# Patient Record
Sex: Female | Born: 1975 | Hispanic: No | Smoking: Never smoker
Health system: Southern US, Community
[De-identification: ages and names within clinical notes are randomized; demographics above are authoritative.]

## PROBLEM LIST (undated history)

## (undated) ENCOUNTER — Ambulatory Visit (HOSPITAL_COMMUNITY): Admission: EM | Source: Home / Self Care

## (undated) DIAGNOSIS — I8390 Asymptomatic varicose veins of unspecified lower extremity: Secondary | ICD-10-CM

## (undated) DIAGNOSIS — I509 Heart failure, unspecified: Secondary | ICD-10-CM

## (undated) DIAGNOSIS — I82409 Acute embolism and thrombosis of unspecified deep veins of unspecified lower extremity: Secondary | ICD-10-CM

## (undated) DIAGNOSIS — E669 Obesity, unspecified: Secondary | ICD-10-CM

## (undated) DIAGNOSIS — Z87440 Personal history of urinary (tract) infections: Secondary | ICD-10-CM

## (undated) DIAGNOSIS — I7300 Raynaud's syndrome without gangrene: Secondary | ICD-10-CM

## (undated) DIAGNOSIS — K219 Gastro-esophageal reflux disease without esophagitis: Secondary | ICD-10-CM

## (undated) HISTORY — PX: APPENDECTOMY: SHX54

## (undated) HISTORY — PX: ENDOVENOUS ABLATION SAPHENOUS VEIN W/ LASER: SUR449

## (undated) HISTORY — PX: ABDOMINAL HYSTERECTOMY: SHX81

## (undated) HISTORY — DX: Asymptomatic varicose veins of unspecified lower extremity: I83.90

## (undated) HISTORY — DX: Acute embolism and thrombosis of unspecified deep veins of unspecified lower extremity: I82.409

---

## 2004-01-10 HISTORY — PX: HERNIA REPAIR: SHX51

## 2008-08-25 ENCOUNTER — Ambulatory Visit: Payer: Self-pay | Admitting: Internal Medicine

## 2008-08-25 LAB — CONVERTED CEMR LAB
AST: 14 units/L (ref 0–37)
Alkaline Phosphatase: 92 units/L (ref 39–117)
BUN: 11 mg/dL (ref 6–23)
Basophils Absolute: 0 10*3/uL (ref 0.0–0.1)
Creatinine, Ser: 0.66 mg/dL (ref 0.40–1.20)
Eosinophils Absolute: 0.5 10*3/uL (ref 0.0–0.7)
Eosinophils Relative: 4 % (ref 0–5)
HCT: 41.5 % (ref 36.0–46.0)
Lymphocytes Relative: 27 % (ref 12–46)
Neutrophils Relative %: 62 % (ref 43–77)
Platelets: 367 10*3/uL (ref 150–400)
RDW: 12.8 % (ref 11.5–15.5)

## 2008-08-26 ENCOUNTER — Encounter (INDEPENDENT_AMBULATORY_CARE_PROVIDER_SITE_OTHER): Payer: Self-pay | Admitting: Internal Medicine

## 2008-08-27 ENCOUNTER — Ambulatory Visit (HOSPITAL_COMMUNITY): Admission: RE | Admit: 2008-08-27 | Discharge: 2008-08-27 | Payer: Self-pay | Admitting: Internal Medicine

## 2008-10-07 ENCOUNTER — Ambulatory Visit: Payer: Self-pay | Admitting: Internal Medicine

## 2009-01-06 ENCOUNTER — Ambulatory Visit: Payer: Self-pay | Admitting: Internal Medicine

## 2009-03-01 ENCOUNTER — Ambulatory Visit: Payer: Self-pay | Admitting: Internal Medicine

## 2009-03-05 ENCOUNTER — Ambulatory Visit: Payer: Self-pay | Admitting: Internal Medicine

## 2009-05-26 ENCOUNTER — Ambulatory Visit: Payer: Self-pay | Admitting: Family Medicine

## 2010-03-16 ENCOUNTER — Inpatient Hospital Stay (INDEPENDENT_AMBULATORY_CARE_PROVIDER_SITE_OTHER)
Admission: RE | Admit: 2010-03-16 | Discharge: 2010-03-16 | Disposition: A | Discharge status: Home / Self Care | Payer: Self-pay | Source: Ambulatory Visit | Attending: Family Medicine | Admitting: Family Medicine

## 2010-03-16 DIAGNOSIS — J069 Acute upper respiratory infection, unspecified: Secondary | ICD-10-CM

## 2010-03-24 ENCOUNTER — Encounter (INDEPENDENT_AMBULATORY_CARE_PROVIDER_SITE_OTHER): Payer: Self-pay | Admitting: *Deleted

## 2010-03-24 LAB — CONVERTED CEMR LAB
CO2: 22 meq/L (ref 19–32)
CRP: 1.5 mg/dL — ABNORMAL HIGH (ref <0.6)
Creatinine, Ser: 0.66 mg/dL (ref 0.40–1.20)
Eosinophils Absolute: 0.2 10*3/uL (ref 0.0–0.7)
Eosinophils Relative: 3 % (ref 0–5)
Glucose, Bld: 72 mg/dL (ref 70–99)
HCT: 40.6 % (ref 36.0–46.0)
Hemoglobin: 13.1 g/dL (ref 12.0–15.0)
Lymphs Abs: 2.2 10*3/uL (ref 0.7–4.0)
MCV: 92.5 fL (ref 78.0–100.0)
Monocytes Absolute: 0.6 10*3/uL (ref 0.1–1.0)
Monocytes Relative: 9 % (ref 3–12)
RBC: 4.39 M/uL (ref 3.87–5.11)
Rhuematoid fact SerPl-aCnc: <10 intl units/mL (ref <=14)
Sodium: 141 meq/L (ref 135–145)
Total Bilirubin: 0.4 mg/dL (ref 0.3–1.2)
Total Protein: 6.7 g/dL (ref 6.0–8.3)
WBC: 6.9 10*3/uL (ref 4.0–10.5)

## 2010-08-06 ENCOUNTER — Inpatient Hospital Stay (INDEPENDENT_AMBULATORY_CARE_PROVIDER_SITE_OTHER)
Admission: RE | Admit: 2010-08-06 | Discharge: 2010-08-06 | Disposition: A | Discharge status: Home / Self Care | Payer: Self-pay | Source: Ambulatory Visit | Attending: Family Medicine | Admitting: Family Medicine

## 2010-08-06 DIAGNOSIS — J019 Acute sinusitis, unspecified: Secondary | ICD-10-CM

## 2010-08-06 LAB — POCT I-STAT, CHEM 8
Glucose, Bld: 83 mg/dL (ref 70–99)
HCT: 40 % (ref 36.0–46.0)
Hemoglobin: 13.6 g/dL (ref 12.0–15.0)
Potassium: 4 mEq/L (ref 3.5–5.1)

## 2010-08-11 ENCOUNTER — Emergency Department (HOSPITAL_COMMUNITY): Payer: Self-pay

## 2010-08-11 ENCOUNTER — Emergency Department (HOSPITAL_COMMUNITY)
Admission: EM | Admit: 2010-08-11 | Discharge: 2010-08-11 | Disposition: A | Discharge status: Home / Self Care | Payer: Self-pay | Attending: Emergency Medicine | Admitting: Emergency Medicine

## 2010-08-11 DIAGNOSIS — I7300 Raynaud's syndrome without gangrene: Secondary | ICD-10-CM | POA: Insufficient documentation

## 2010-08-11 DIAGNOSIS — R6889 Other general symptoms and signs: Secondary | ICD-10-CM | POA: Insufficient documentation

## 2010-08-11 DIAGNOSIS — K222 Esophageal obstruction: Secondary | ICD-10-CM | POA: Insufficient documentation

## 2010-09-20 ENCOUNTER — Ambulatory Visit (HOSPITAL_COMMUNITY)
Admission: RE | Admit: 2010-09-20 | Discharge: 2010-09-20 | Disposition: A | Discharge status: Home / Self Care | Payer: Self-pay | Source: Ambulatory Visit | Attending: Gastroenterology | Admitting: Gastroenterology

## 2010-09-20 ENCOUNTER — Ambulatory Visit (HOSPITAL_COMMUNITY): Payer: Self-pay

## 2010-09-20 DIAGNOSIS — K222 Esophageal obstruction: Secondary | ICD-10-CM | POA: Insufficient documentation

## 2010-09-20 DIAGNOSIS — R1310 Dysphagia, unspecified: Secondary | ICD-10-CM | POA: Insufficient documentation

## 2010-09-20 DIAGNOSIS — I7300 Raynaud's syndrome without gangrene: Secondary | ICD-10-CM | POA: Insufficient documentation

## 2010-09-20 DIAGNOSIS — Z79899 Other long term (current) drug therapy: Secondary | ICD-10-CM | POA: Insufficient documentation

## 2010-09-21 ENCOUNTER — Emergency Department (HOSPITAL_COMMUNITY): Payer: Self-pay

## 2010-09-21 ENCOUNTER — Emergency Department (HOSPITAL_COMMUNITY)
Admission: EM | Admit: 2010-09-21 | Discharge: 2010-09-21 | Disposition: A | Discharge status: Home / Self Care | Payer: Self-pay | Attending: Emergency Medicine | Admitting: Emergency Medicine

## 2010-09-21 DIAGNOSIS — R079 Chest pain, unspecified: Secondary | ICD-10-CM | POA: Insufficient documentation

## 2010-09-21 DIAGNOSIS — Z9889 Other specified postprocedural states: Secondary | ICD-10-CM | POA: Insufficient documentation

## 2010-09-21 DIAGNOSIS — R070 Pain in throat: Secondary | ICD-10-CM | POA: Insufficient documentation

## 2010-09-21 DIAGNOSIS — G8918 Other acute postprocedural pain: Secondary | ICD-10-CM | POA: Insufficient documentation

## 2010-09-26 ENCOUNTER — Other Ambulatory Visit: Payer: Self-pay | Admitting: Gastroenterology

## 2010-09-26 ENCOUNTER — Other Ambulatory Visit (HOSPITAL_COMMUNITY): Payer: Self-pay | Admitting: Gastroenterology

## 2010-09-27 ENCOUNTER — Other Ambulatory Visit (HOSPITAL_COMMUNITY): Payer: Self-pay | Admitting: Gastroenterology

## 2010-09-27 ENCOUNTER — Ambulatory Visit (HOSPITAL_COMMUNITY)
Admission: RE | Admit: 2010-09-27 | Discharge: 2010-09-27 | Disposition: A | Discharge status: Home / Self Care | Payer: Self-pay | Source: Ambulatory Visit | Attending: Gastroenterology | Admitting: Gastroenterology

## 2010-09-27 DIAGNOSIS — R1310 Dysphagia, unspecified: Secondary | ICD-10-CM | POA: Insufficient documentation

## 2010-09-27 MED ORDER — IOHEXOL 300 MG/ML  SOLN
40.0000 mL | Freq: Once | INTRAMUSCULAR | Status: AC | PRN
  Administered 2010-09-27: 40 mL via ORAL

## 2011-01-04 ENCOUNTER — Encounter: Payer: Self-pay | Admitting: *Deleted

## 2011-01-04 ENCOUNTER — Emergency Department (HOSPITAL_COMMUNITY)
Admission: EM | Admit: 2011-01-04 | Discharge: 2011-01-04 | Disposition: A | Discharge status: Other Acute Inpatient Hospital | Payer: Self-pay | Source: Home / Self Care | Attending: Emergency Medicine | Admitting: Emergency Medicine

## 2011-01-04 DIAGNOSIS — R6889 Other general symptoms and signs: Secondary | ICD-10-CM

## 2011-01-04 DIAGNOSIS — J111 Influenza due to unidentified influenza virus with other respiratory manifestations: Secondary | ICD-10-CM

## 2011-01-04 HISTORY — DX: Raynaud's syndrome without gangrene: I73.00

## 2011-01-04 MED ORDER — FEXOFENADINE-PSEUDOEPHED ER 60-120 MG PO TB12: 1.0000 | ORAL_TABLET | Freq: Two times a day (BID) | ORAL | Status: DC

## 2011-01-04 MED ORDER — PREDNISONE 20 MG PO TABS: 40.0000 mg | ORAL_TABLET | Freq: Every day | ORAL | Status: AC

## 2011-01-04 NOTE — ED Notes (Signed)
Pt  Reports  Symptoms  Of  Aching  All over  sorethroat  Nasal  stuffyness   Chills      X  3  Days  Pt  Reports  Has  Had  Runny  Nose     As  Well    Pt  Reports  Has  Been using otc  meds  Without  releif

## 2011-01-04 NOTE — ED Provider Notes (Addendum)
History     CSN: [id number]  Arrival date & time 01/04/11  1302   First MD Initiated Contact with Patient 01/04/11 1445      Chief Complaint  Patient presents with  . 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 female presenting with cough.  Cough This is a new problem. The current episode started more than 2 days ago. The problem occurs constantly. The problem has not changed since onset.The cough is non-productive. Associated symptoms include chills, ear congestion, headaches, rhinorrhea, sore throat and myalgias. Pertinent negatives include no sweats and no wheezing. She has tried decongestants for the symptoms. The treatment provided no relief. Her past medical history does not include bronchitis, pneumonia, emphysema or asthma.    Past Medical History  Diagnosis Date  . Raynaud disease     Past Surgical History  Procedure Date  . Abdominal hysterectomy   . Cesarean section     Family History  Problem Relation Age of Onset  . Diabetes Mother   . Diabetes Father     History  Substance Use Topics  . Smoking status: Not on file  . Smokeless tobacco: Not on file  . Alcohol Use:     OB History    Grav Para Term Preterm Abortions TAB SAB Ect Mult Living                  Review of Systems  Constitutional: Positive for chills.  HENT: Positive for sore throat and rhinorrhea.   Respiratory: Positive for cough. Negative for wheezing.   Musculoskeletal: Positive for myalgias.  Neurological: Positive for headaches.    Allergies  Review of patient's allergies indicates not on file.  Home Medications     Current Outpatient Rx  Name Route Sig Dispense Refill  . NIFEDIPINE ER OSMOTIC 90 MG PO TB24 Oral Take 90 mg by mouth daily.      Marland Kitchen FEXOFENADINE-PSEUDOEPHED ER 60-120 MG PO TB12 Oral Take 1 tablet by mouth every 12 (twelve) hours. 30 tablet 0  . PREDNISONE 20 MG PO TABS Oral Take 2 tablets (40 mg total) by mouth daily. 10 tablet 5    BP 100/68  Pulse 62  Temp(Src) 98.1 F (36.7 C) (Oral)  Resp 14  SpO2 99%  Physical Exam  Nursing note and vitals reviewed. Constitutional: She appears well-developed and well-nourished. No distress.  HENT:  Head: Normocephalic.  Right Ear: Tympanic membrane normal.  Left Ear: Tympanic membrane normal.  Nose: Rhinorrhea present.  Mouth/Throat: Uvula is midline, oropharynx is clear and moist and mucous membranes are normal.  Eyes: Conjunctivae are normal.  Neck: Normal range of motion. Neck supple. No JVD present. No tracheal deviation present.  Cardiovascular: Normal rate.   Pulmonary/Chest: Effort normal. No respiratory distress. She has no wheezes. She has no rales. She exhibits no tenderness.  Abdominal: Soft.  Lymphadenopathy:    She has no cervical adenopathy.  Skin: No abrasion and no rash noted.    ED Course  Procedures (including critical care time)  Labs Reviewed - No data to display No results found.   1. Influenza-like symptoms       MDM  ILI  x 4 dys with normal lung exam comfortable- requesting doctors note-        Jimmie Molly, MD 01/04/11 1542  Jimmie Molly, MD 01/04/11 [phone]

## 2011-02-03 ENCOUNTER — Emergency Department (HOSPITAL_COMMUNITY)
Admission: EM | Admit: 2011-02-03 | Discharge: 2011-02-03 | Disposition: A | Discharge status: Home / Self Care | Payer: Self-pay | Source: Home / Self Care | Attending: Emergency Medicine | Admitting: Emergency Medicine

## 2011-02-03 ENCOUNTER — Encounter (HOSPITAL_COMMUNITY): Payer: Self-pay | Admitting: *Deleted

## 2011-02-03 DIAGNOSIS — J329 Chronic sinusitis, unspecified: Secondary | ICD-10-CM

## 2011-02-03 MED ORDER — PSEUDOEPHEDRINE-GUAIFENESIN ER 120-1200 MG PO TB12: 1.0000 | ORAL_TABLET | Freq: Two times a day (BID) | ORAL | Status: DC

## 2011-02-03 MED ORDER — FLUTICASONE PROPIONATE 50 MCG/ACT NA SUSP: 2.0000 | Freq: Every day | NASAL | Status: DC

## 2011-02-03 MED ORDER — IBUPROFEN 600 MG PO TABS: 600.0000 mg | ORAL_TABLET | Freq: Four times a day (QID) | ORAL | Status: AC | PRN

## 2011-02-03 MED ORDER — DOXYCYCLINE HYCLATE 100 MG PO CAPS: 100.0000 mg | ORAL_CAPSULE | Freq: Two times a day (BID) | ORAL | Status: AC

## 2011-02-03 NOTE — ED Provider Notes (Signed)
History     CSN: [id number]  Arrival date & time 02/03/11  1736   First MD Initiated Contact with Patient 02/03/11 1810      Chief Complaint  Patient presents with  . Otalgia  . Generalized Body Aches    (Consider location/radiation/quality/duration/timing/severity/associated sxs/prior treatment) HPI Comments: Pt with nasal congestion, postnasal drip, ST, nonproductive cough, bilateral frontal sinus pain/pressure worse with bending forward/lying down x 4 days, ear fullness. States teeth mildly hurt. No fevers, N/V, other HA, bodyaches, purulent nasal d/c. Has not tried anything for her symptoms. No known sick contacts. Patient recently held with flulike symptoms last month however, she states she recovered from this  ROS as noted in HPI. All other ROS negative.   Patient is a 36 y.o. female presenting with ear pain. The history is provided by the patient.  Otalgia    Past Medical History  Diagnosis Date  . Raynaud disease   . Raynaud's disease     Past Surgical History  Procedure Date  . Abdominal hysterectomy   . Cesarean section     Family History  Problem Relation Age of Onset  . Diabetes Mother   . Diabetes Father     History  Substance Use Topics  . Smoking status: Never Smoker   . Smokeless tobacco: Not on file  . Alcohol Use: No    OB History    Grav Para Term Preterm Abortions TAB SAB Ect Mult Living                  Review of Systems  HENT: Positive for ear pain.     Allergies  Latex  Home Medications   Current Outpatient Rx  Name Route Sig Dispense Refill  . DOXYCYCLINE HYCLATE 100 MG PO CAPS Oral Take 1 capsule (100 mg total) by mouth 2 (two) times daily. X 7 days 14 capsule 0  . FLUTICASONE PROPIONATE 50 MCG/ACT NA SUSP Nasal Place 2 sprays into the nose daily. 16 g 0  . IBUPROFEN 600 MG PO TABS Oral Take 1 tablet (600 mg total) by mouth every 6 (six) hours as needed for pain. 30 tablet 0  . NIFEDIPINE ER OSMOTIC 90 MG PO TB24 Oral  Take 90 mg by mouth daily.      Marland Kitchen PSEUDOEPHEDRINE-GUAIFENESIN ER [phone] MG PO TB12 Oral Take 1 tablet by mouth 2 (two) times daily. 20 each 0    BP 112/81  Pulse 63  Temp(Src) 98.2 F (36.8 C) (Oral)  Resp 18  SpO2 99%  Physical Exam  Nursing note and vitals reviewed. Constitutional: She is oriented to person, place, and time. She appears well-developed and well-nourished.  HENT:  Head: Normocephalic and atraumatic.  Right Ear: Tympanic membrane normal.  Left Ear: Tympanic membrane normal.  Nose: Mucosal edema and rhinorrhea present. No epistaxis. Right sinus exhibits maxillary sinus tenderness. Right sinus exhibits no frontal sinus tenderness. Left sinus exhibits maxillary sinus tenderness. Left sinus exhibits no frontal sinus tenderness.  Mouth/Throat: Uvula is midline, oropharynx is clear and moist and mucous membranes are normal.       (-) frontal, maxillary sinus tenderness  Eyes: Conjunctivae and EOM are normal. Pupils are equal, round, and reactive to light.  Neck: Normal range of motion. Neck supple.  Cardiovascular: Normal rate, regular rhythm, normal heart sounds and intact distal pulses.   Pulmonary/Chest: Effort normal and breath sounds normal.  Abdominal: Soft.  Musculoskeletal: Normal range of motion.  Lymphadenopathy:    She has no cervical adenopathy.  Neurological: She is alert and oriented to person, place, and time.  Skin: Skin is warm and dry. No rash noted.  Psychiatric: She has a normal mood and affect. Her behavior is normal. Judgment and thought content normal.    ED Course  Procedures (including critical care time)  Labs Reviewed - No data to display No results found.   1. Sinusitis     MDM  Previous chart reviewed. Seen last month for flu like sx.   No fevers >102, has had sx for < 10 days, no h/o double sickening. No historical or objective evidence of bacterial infection. No indication for abx. Will start flonase, mucinex-d, increase  fluids, nasal saline irrigation,  tylenol/motrin prn pain. Will send home with abx but advised pt to wait to fill it. Discussed MDM and plan with pt. Pt agrees with plan and will f/u with PMD prn.    Luiz Blare, MD 02/03/11 [phone]

## 2011-02-03 NOTE — ED Notes (Signed)
Pt is here with complaints of 3 week history of ear pressure, runny nose with yellow phlegm, body aches and generalized weakness.  Pt was treated at [facility] 3 weeks ago and told she had the flu.

## 2011-03-15 ENCOUNTER — Other Ambulatory Visit (HOSPITAL_COMMUNITY): Payer: Self-pay | Admitting: Gastroenterology

## 2011-03-17 ENCOUNTER — Ambulatory Visit (HOSPITAL_COMMUNITY)
Admission: RE | Admit: 2011-03-17 | Discharge: 2011-03-17 | Disposition: A | Discharge status: Home / Self Care | Payer: Self-pay | Source: Ambulatory Visit | Attending: Gastroenterology | Admitting: Gastroenterology

## 2011-03-17 DIAGNOSIS — R1310 Dysphagia, unspecified: Secondary | ICD-10-CM | POA: Insufficient documentation

## 2011-04-24 DIAGNOSIS — I7300 Raynaud's syndrome without gangrene: Secondary | ICD-10-CM | POA: Insufficient documentation

## 2011-07-09 ENCOUNTER — Emergency Department (INDEPENDENT_AMBULATORY_CARE_PROVIDER_SITE_OTHER)
Admission: EM | Admit: 2011-07-09 | Discharge: 2011-07-09 | Disposition: A | Discharge status: Home / Self Care | Payer: Self-pay | Source: Home / Self Care | Attending: Family Medicine | Admitting: Family Medicine

## 2011-07-09 ENCOUNTER — Encounter (HOSPITAL_COMMUNITY): Payer: Self-pay | Admitting: Emergency Medicine

## 2011-07-09 DIAGNOSIS — L0501 Pilonidal cyst with abscess: Secondary | ICD-10-CM

## 2011-07-09 MED ORDER — FLUCONAZOLE 200 MG PO TABS: 200.0000 mg | ORAL_TABLET | Freq: Every day | ORAL | Status: AC

## 2011-07-09 MED ORDER — CEPHALEXIN 500 MG PO CAPS: 500.0000 mg | ORAL_CAPSULE | Freq: Four times a day (QID) | ORAL | Status: AC

## 2011-07-09 MED ORDER — HYDROCODONE-ACETAMINOPHEN 5-500 MG PO TABS: 1.0000 | ORAL_TABLET | Freq: Four times a day (QID) | ORAL | Status: DC | PRN

## 2011-07-09 NOTE — ED Notes (Signed)
Instructed to place gown on, remove clothing for physician exam.  Provided warm blankets

## 2011-07-09 NOTE — Discharge Instructions (Signed)

## 2011-07-09 NOTE — ED Provider Notes (Signed)
History     CSN: [id number]  Arrival date & time 07/09/11  1234   None     Chief Complaint  Patient presents with  . Abscess    (Consider location/radiation/quality/duration/timing/severity/associated sxs/prior treatment) Patient is a 36 y.o. female presenting with abscess. The history is provided by the patient.  Abscess   This patient presents for evaluation of a cutaneous abscess.  The lesion is located in the lower back/pilonidal area.  Onset was four days ago.  Symptoms have not improved.  Abscess has associated symptoms of yellow green drainage and pain (6/10).  The patient does not have previous history of cutaneous abscesses.  There is no previous history of MRSA.   They do not  have a history of diabetes. Attempted to drainage at home, has applied A&D ointment to area Past Medical History  Diagnosis Date  . Raynaud's disease     Past Surgical History  Procedure Date  . Abdominal hysterectomy   . Cesarean section   . Appendectomy     Family History  Problem Relation Age of Onset  . Diabetes Mother   . Diabetes Father     History  Substance Use Topics  . Smoking status: Never Smoker   . Smokeless tobacco: Not on file  . Alcohol Use: No    OB History    Grav Para Term Preterm Abortions TAB SAB Ect Mult Living                  Review of Systems  All other systems reviewed and are negative.    Allergies  Latex  Home Medications   Current Outpatient Rx  Name Route Sig Dispense Refill  . OVER THE COUNTER MEDICATION  A,d&e ointment    . CEPHALEXIN 500 MG PO CAPS Oral Take 1 capsule (500 mg total) by mouth 4 (four) times daily. 28 capsule 0  . FLUCONAZOLE 200 MG PO TABS Oral Take 1 tablet (200 mg total) by mouth daily. Take one tab today with initiation of antibiotics and then second tab as needed in one week. 2 tablet 0  . FLUTICASONE PROPIONATE 50 MCG/ACT NA SUSP Nasal Place 2 sprays into the nose daily. 16 g 0  . NIFEDIPINE ER OSMOTIC 90 MG  PO TB24 Oral Take 90 mg by mouth daily.      Marland Kitchen PSEUDOEPHEDRINE-GUAIFENESIN ER [phone] MG PO TB12 Oral Take 1 tablet by mouth 2 (two) times daily. 20 each 0    There were no vitals taken for this visit.  Physical Exam  Nursing note and vitals reviewed. Constitutional: She is oriented to person, place, and time. Vital signs are normal. She appears well-developed and well-nourished. She is active and cooperative.  HENT:  Head: Normocephalic.  Eyes: Conjunctivae are normal. Pupils are equal, round, and reactive to light. No scleral icterus.  Neck: Trachea normal. Neck supple.  Cardiovascular: Normal rate, regular rhythm and normal heart sounds.   Pulmonary/Chest: Effort normal and breath sounds normal.  Neurological: She is alert and oriented to person, place, and time. No cranial nerve deficit or sensory deficit. GCS eye subscore is 4. GCS verbal subscore is 5. GCS motor subscore is 6.  Skin: Skin is warm and dry.          Superficial abscess approx.3/4 cm diameter, open and draining scant amount purulent discharge  Psychiatric: She has a normal mood and affect. Her speech is normal and behavior is normal. Judgment and thought content normal. Cognition and memory are normal.  ED Course  Procedures (including critical care time)  Labs Reviewed - No data to display No results found.   1. Pilonidal abscess       MDM  Keep area covered, take antibiotics as prescribed, tylenol or motrin for pain, RTC PRN if symptoms do not improve.  Diflucan for yeast prophylaxis.        Johnsie Kindred, NP 07/09/11 1452

## 2011-07-09 NOTE — ED Notes (Signed)
Reports abscess on buttocks. Painful area.  Noticed wed or thursday

## 2011-07-09 NOTE — ED Notes (Signed)
Patient wanting to leave, patient did walk to lobby, carmen notified.  Patient returned to treatment room.  Dominique [name] informed patient coming in to see her.

## 2011-07-10 NOTE — ED Provider Notes (Signed)
Medical screening examination/treatment/procedure(s) were performed by resident physician or non-physician practitioner and as supervising physician I was immediately available for consultation/collaboration.   Christmas Faraci DOUGLAS MD.    Alisen Marsiglia D Gresham Caetano, MD 07/10/11 2025 

## 2011-07-19 ENCOUNTER — Other Ambulatory Visit: Payer: Self-pay

## 2011-07-19 DIAGNOSIS — I7300 Raynaud's syndrome without gangrene: Secondary | ICD-10-CM

## 2011-08-01 ENCOUNTER — Encounter (HOSPITAL_COMMUNITY): Payer: Self-pay

## 2011-08-01 ENCOUNTER — Emergency Department (HOSPITAL_COMMUNITY)
Admission: EM | Admit: 2011-08-01 | Discharge: 2011-08-01 | Disposition: A | Discharge status: Home / Self Care | Payer: Self-pay | Attending: Emergency Medicine | Admitting: Emergency Medicine

## 2011-08-01 DIAGNOSIS — R531 Weakness: Secondary | ICD-10-CM

## 2011-08-01 DIAGNOSIS — R5381 Other malaise: Secondary | ICD-10-CM | POA: Insufficient documentation

## 2011-08-01 DIAGNOSIS — I7300 Raynaud's syndrome without gangrene: Secondary | ICD-10-CM | POA: Insufficient documentation

## 2011-08-01 NOTE — ED Notes (Signed)
Flu like symptoms, and abscess to bottom, headache ears feels clogged.

## 2011-08-01 NOTE — ED Provider Notes (Signed)
History  This chart was scribed for Doug Sou, MD by Ladona Ridgel Day. This patient was seen in room TR08C/TR08C and the patient's care was started at 1144.   CSN: [id number]  Arrival date & time 08/01/11  1104   First MD Initiated Contact with Patient 08/01/11 1144      Chief Complaint  Patient presents with  . URI    The history is provided by the patient. No language interpreter was used.   Dominique [name] is a 36 y.o. female who presents to the Emergency Department complaining of flu like symptoms which began about 6 days ago. She states she has visual disturbances which are aggravated when she is mad or frustrated. She also states intermittent tinnitus, HA, generalized weakness, fever, congestion. She denies any emesis, nausea, and diarrhea as associated symptoms. She tried taking Tylenol with some relief but has not taken any today. She denies any smoking or drinking.. denies blurred vision presently. Complains of slight ringing in her ear is presently. Admits to nasal congestion. No treatment prior to coming here. Symptoms are worse when she gets mad improved when she is calm  Her PCP is [facility] at [facility]  Past Medical History  Diagnosis Date  . Raynaud's disease     Past Surgical History  Procedure Date  . Abdominal hysterectomy   . Cesarean section   . Appendectomy     Family History  Problem Relation Age of Onset  . Diabetes Mother   . Diabetes Father     History  Substance Use Topics  . Smoking status: Never Smoker   . Smokeless tobacco: Not on file  . Alcohol Use: No    OB History    Grav Para Term Preterm Abortions TAB SAB Ect Mult Living                  Review of Systems  HENT: Positive for congestion and tinnitus.        Nasal congestion  Eyes: Positive for visual disturbance.  Respiratory: Negative.   Cardiovascular: Negative.   Gastrointestinal: Negative.   Musculoskeletal: Negative.   Skin: Negative.   Neurological: Positive for  weakness.  Hematological: Negative.   Psychiatric/Behavioral: Negative.   All other systems reviewed and are negative.    Allergies  Latex  Home Medications   Current Outpatient Rx  Name Route Sig Dispense Refill  . FLUTICASONE PROPIONATE 50 MCG/ACT NA SUSP Nasal Place 2 sprays into the nose daily. 16 g 0  . NIFEDIPINE ER OSMOTIC 90 MG PO TB24 Oral Take 90 mg by mouth daily.      Marland Kitchen OVER THE COUNTER MEDICATION  A,d&e ointment    . PSEUDOEPHEDRINE-GUAIFENESIN ER [phone] MG PO TB12 Oral Take 1 tablet by mouth 2 (two) times daily. 20 each 0    BP 112/79  Pulse 68  Temp 99.1 F (37.3 C) (Oral)  Resp 18  SpO2 100%  Physical Exam  Nursing note and vitals reviewed. Constitutional: She is oriented to person, place, and time. She appears well-developed and well-nourished.  HENT:  Head: Normocephalic and atraumatic.  Right Ear: External ear normal.  Left Ear: External ear normal.  Mouth/Throat: Oropharynx is clear and moist.       Bilateral tympanic membranes normal  Eyes: Conjunctivae are normal. Pupils are equal, round, and reactive to light.       Fundi benign  Neck: Neck supple. No tracheal deviation present. No thyromegaly present.  Cardiovascular: Normal rate and regular rhythm.  No murmur heard. Pulmonary/Chest: Effort normal and breath sounds normal.  Abdominal: Soft. Bowel sounds are normal. She exhibits no distension. There is no tenderness.  Musculoskeletal: Normal range of motion. She exhibits no edema and no tenderness.  Neurological: She is alert and oriented to person, place, and time. Coordination normal.       Gait normal Romberg normal pronator drift normal  Skin: Skin is warm and dry. No rash noted.  Psychiatric: She has a normal mood and affect.    ED Course  Procedures (including critical care time) DIAGNOSTIC STUDIES: Oxygen Saturation is 100% on room air, normal by my interpretation.   CBG equals 88, normal  COORDINATION OF CARE:    Labs  Reviewed - No data to display No results found.   No diagnosis found.    MDM  Feel that patient has large component of anxiety as cause of her symptoms. She appears well nontoxic [facility] in management f[facility]u [facility] when necessary Diagnosis nonspecific weakness   I personally performed the services described in this documentation, which was scribed in my presence. The recorded information has been reviewed and considered.       Doug Sou, MD 08/01/11 1200

## 2011-08-01 NOTE — ED Notes (Signed)
Pt reports she starting having flu like symptoms since Thursday. Pt reports she gets episodes of blurry eyes, nausea, ear ringing and a hot flushed feeling lasts about 10 mins, pt reports she has had about 4 episodes on Saturday, and stated she was around her ex which made her very anxious. Reports she has a couple on Sunday but was really tired and they weren't as bad.

## 2011-08-18 ENCOUNTER — Encounter: Payer: Self-pay | Admitting: Surgery

## 2011-08-21 ENCOUNTER — Ambulatory Visit (INDEPENDENT_AMBULATORY_CARE_PROVIDER_SITE_OTHER): Payer: Self-pay | Admitting: Surgery

## 2011-08-21 ENCOUNTER — Encounter: Payer: Self-pay | Admitting: Surgery

## 2011-08-21 ENCOUNTER — Ambulatory Visit (INDEPENDENT_AMBULATORY_CARE_PROVIDER_SITE_OTHER): Payer: Self-pay | Admitting: *Deleted

## 2011-08-21 VITALS — BP 116/60 | HR 65 | Resp 16 | Ht <= 58 in | Wt 174.0 lb

## 2011-08-21 DIAGNOSIS — M79609 Pain in unspecified limb: Secondary | ICD-10-CM | POA: Insufficient documentation

## 2011-08-21 DIAGNOSIS — I8390 Asymptomatic varicose veins of unspecified lower extremity: Secondary | ICD-10-CM

## 2011-08-21 DIAGNOSIS — I83893 Varicose veins of bilateral lower extremities with other complications: Secondary | ICD-10-CM

## 2011-08-21 DIAGNOSIS — M7989 Other specified soft tissue disorders: Secondary | ICD-10-CM

## 2011-08-21 DIAGNOSIS — I7300 Raynaud's syndrome without gangrene: Secondary | ICD-10-CM

## 2011-08-21 NOTE — Progress Notes (Signed)
[facility]   Patient name: Dominique [name] MRN: [id number] DOB: 30-Nov-1975 Sex: female   Referred by: Dr.Shaw  Reason for referral:  Chief Complaint  Patient presents with  . New Evaluation    ? Raynauds, hands bright red and painful/Dr. Norberto Sorenson -  pt c/o of swelling and heaviness in legs     HISTORY OF PRESENT ILLNESS: The patient is referred today for evaluation of bilateral hands and feet discoloration and pain. She states that this has been occurring for many many years. This is been treated with Procardia in the past thinking that this was Raynaud's phenomenon. However, she recently saw a rheumatologist. In this setting that it was not Raynaud's and that it may be a vascular problem. Therefore, she has been referred to me. She states that her symptoms are made worse by walking and with stress. The best to for problems as hot baths. She does complain of her legs getting heavy with activity. She has a history of a right leg DVT following delivery of her child. She has tried to her compression stockings in the past but had difficulty with them cutting off her circulation. She did have severe trauma to the right leg recently it has been associated with her swelling.  The patient also takes medication for reflux which has been well controlled. 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  Past Medical History  Diagnosis Date  . Raynaud's disease   . DVT (deep venous thrombosis)     Past Surgical History  Procedure Date  . Abdominal hysterectomy   . Cesarean section   . Appendectomy   . Hernia repair 2006    History   Social History  . Marital Status: Divorced    Spouse Name: N/A    Number of Children: N/A  . Years of Education: N/A   Occupational History  . Not on file.   Social History Main Topics  . Smoking status: Never Smoker   . Smokeless tobacco: Never Used  . Alcohol Use: No  . Drug Use: No     former narcotic presription  abuse  . Sexually Active: Not on file   Other Topics Concern  . Not on file   Social History Narrative  . No narrative on file    Family History  Problem Relation Age of Onset  . Diabetes Mother   . Heart disease Mother   . Hyperlipidemia Mother   . Hypertension Mother   . Other Mother     amputation, varicose veins  . Diabetes Father   . Hyperlipidemia Father   . Hypertension Father   . Peripheral vascular disease Father   . Other Father     varicose veins  . Deep vein thrombosis Brother   . Other Brother     varicose veins    Allergies as of 08/21/2011 - Review Complete 08/21/2011  Allergen Reaction Noted  . Latex  02/03/2011    Current Outpatient Prescriptions on File Prior to Visit  Medication Sig Dispense Refill  . OVER THE COUNTER MEDICATION A,d&e ointment      . Pantoprazole Sodium (PROTONIX PO) Take by mouth daily.      . fluticasone (FLONASE) 50 MCG/ACT nasal spray Place 2 sprays into the nose daily.  16 g  0  . NIFEdipine (PROCARDIA XL/ADALAT-CC) 90 MG 24 hr tablet Take 90 mg by mouth daily.        . Pseudoephedrine-Guaifenesin (MUCINEX D) [phone] MG TB12  Take 1 tablet by mouth 2 (two) times daily.  20 each  0     REVIEW OF SYSTEMS: Cardiovascular: Positive for pain in her legs with walking, history of DVT, slight swelling, varicose veins Pulmonary: Negative Neuro: Positive for weakness in her arms and legs Hematology: Negative GI: Negative GU: Negative Psychiatry: Negative Skin: Negative Constitutional: No fever no chills All other systems negative  PHYSICAL EXAMINATION: General: The patient appears their stated age.  Vital signs are BP 116/60  Pulse 65  Resp 16  Ht 4' 7.5" (1.41 m)  Wt 174 lb (78.926 kg)  BMI 39.72 kg/m2  SpO2 100% HEENT:  No gross abnormalities Pulmonary: Respirations are non-labored Abdomen: Soft and non-tender  Musculoskeletal: There are no major deformities.   Neurologic: No focal weakness or paresthesias are  detected, Skin: Reddish discoloration of both upper extremity. Psychiatric: The patient has normal affect. Cardiovascular: There is a regular rate and rhythm without significant murmur appreciated. Palpable dorsalis pedis pulse bilaterally. Palpable brachial and radial pulse bilaterally.  Nonpitting right leg edema bilateral varicosities in the medial region of both legs around the knee  Diagnostic Studies: Ultrasound was ordered and reviewed today. This shows triphasic waveforms in bilateral radial and ulnar arteries. She has no evidence of DVT in the upper extremity.   Assessment:  Bilateral arm and leg discoloration with pain Venous insufficiency Plan: After reviewing the patient's ultrasound and examining her I agree that this most likely is not Raynaud's. I also do not feel that her symptoms are vascular in origin. I suspect she has an undiagnosed vasculitis or other autoimmune problem that would be best evaluated by rheumatology.  In addition to her above symptoms she also has I believe to be venous insufficiency. The right leg is worse than the left. On the right leg this most likely is secondary to her history of DVT. I have given her a prescription of thigh-high compression stockings, 20-30 mm gradient. I am going to have her come back for reflux evaluation. I am doing this because she states that her varicosities become very painful and very bothersome to her. She may benefit from having these removed. She will followup in 3 months for this problem     V. Charlena Cross, M.D. [facility] [place] Office: [phone] Pager:  [phone]

## 2011-08-22 NOTE — Addendum Note (Signed)
Addended by: Sharee Pimple on: 08/22/2011 08:06 AM   Modules accepted: Orders

## 2011-09-05 ENCOUNTER — Telehealth: Payer: Self-pay | Admitting: *Deleted

## 2011-09-05 ENCOUNTER — Emergency Department (HOSPITAL_COMMUNITY): Payer: Self-pay

## 2011-09-05 ENCOUNTER — Emergency Department (HOSPITAL_COMMUNITY)
Admission: EM | Admit: 2011-09-05 | Discharge: 2011-09-05 | Disposition: A | Discharge status: Home / Self Care | Payer: Self-pay | Attending: Emergency Medicine | Admitting: Emergency Medicine

## 2011-09-05 ENCOUNTER — Encounter (HOSPITAL_COMMUNITY): Payer: Self-pay | Admitting: Emergency Medicine

## 2011-09-05 DIAGNOSIS — Z86718 Personal history of other venous thrombosis and embolism: Secondary | ICD-10-CM | POA: Insufficient documentation

## 2011-09-05 DIAGNOSIS — M25579 Pain in unspecified ankle and joints of unspecified foot: Secondary | ICD-10-CM | POA: Insufficient documentation

## 2011-09-05 DIAGNOSIS — M79673 Pain in unspecified foot: Secondary | ICD-10-CM

## 2011-09-05 DIAGNOSIS — I7300 Raynaud's syndrome without gangrene: Secondary | ICD-10-CM | POA: Insufficient documentation

## 2011-09-05 MED ORDER — IBUPROFEN 600 MG PO TABS: 600.0000 mg | ORAL_TABLET | Freq: Three times a day (TID) | ORAL | Status: DC | PRN

## 2011-09-05 MED ORDER — IBUPROFEN 400 MG PO TABS
800.0000 mg | ORAL_TABLET | Freq: Once | ORAL | Status: AC
  Administered 2011-09-05: 800 mg via ORAL
  Filled 2011-09-05 (×2): qty 1

## 2011-09-05 NOTE — Telephone Encounter (Signed)
Patient called in to report that her legs are still feeling heavy, no signs of DVT, no discoloration She has not gotten her compression hose yet because of finances; [facility] patient and she has not gotten established with another PCP. I advised her to follow up with the phone numbers for other PCPs that she was given by [facility] so she can be referred to a Rheumatologist as per Dr. Estanislado Spire note. She will also get her stockings and wear them; she will [facility] for her varicose veins. She voiced understanding of this plan and is in agreement.

## 2011-09-05 NOTE — ED Provider Notes (Signed)
History   This chart was scribed for Gerhard Munch, MD by Melba Coon. The patient was seen in room TR07C/TR07C and the patient's care was started at 3:43PM.    CSN: [id number]  Arrival date & time 09/05/11  1402   First MD Initiated Contact with Patient 09/05/11 1430      Chief Complaint  Patient presents with  . Ankle Pain    (Consider location/radiation/quality/duration/timing/severity/associated sxs/prior treatment) The history is provided by the patient. No language interpreter was used.   Dominique [name] is a 36 y.o. female who presents to the Emergency Department complaining of constant, moderate to severe right ankle pain with an onset a month ago. 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 Pt states that it hurts to take her shoes off. No new shoes since 2 months ago. Pain is worse at the beginning of the day but wanes off while the day progresses. Intermittent right foot numbness present. No HA, fever, neck pain, sore throat, rash, back pain, CP, SOB, abd pain, n/v/d, dysuria, or extremity edema, weakness, or tingling. Pt states that she broke the same foot 6-7 years ago but no new injury or trauma to the affected ankle. No known allergies. No other pertinent medical symptoms.   Past Medical History  Diagnosis Date  . Raynaud's disease   . DVT (deep venous thrombosis)     Past Surgical History  Procedure Date  . Abdominal hysterectomy   . Cesarean section   . Appendectomy   . Hernia repair 2006    Family History  Problem Relation Age of Onset  . Diabetes Mother   . Heart disease Mother   . Hyperlipidemia Mother   . Hypertension Mother   . Other Mother     amputation, varicose veins  . Diabetes Father   . Hyperlipidemia Father   . Hypertension Father   . Peripheral vascular disease Father   . Other Father     varicose veins  . Deep vein thrombosis Brother   . Other Brother     varicose veins    History  Substance Use Topics  .  Smoking status: Never Smoker   . Smokeless tobacco: Never Used  . Alcohol Use: No    OB History    Grav Para Term Preterm Abortions TAB SAB Ect Mult Living                  Review of Systems 10 Systems reviewed and all are negative for acute change except as noted in the HPI.   Allergies  Latex  Home Medications   Current Outpatient Rx  Name Route Sig Dispense Refill  . OVER THE COUNTER MEDICATION  1 application daily as needed. A&D Ointment to hands.    Marland Kitchen PANTOPRAZOLE SODIUM 40 MG PO TBEC Oral Take 40 mg by mouth daily.      BP 113/85  Pulse 86  Temp 98.9 F (37.2 C) (Oral)  Resp 16  SpO2 99%  Physical Exam  Nursing note and vitals reviewed. Constitutional: She is oriented to person, place, and time. She appears well-developed and well-nourished. No distress.  HENT:  Head: Normocephalic and atraumatic.  Eyes: EOM are normal.  Neck: Neck supple. No tracheal deviation present.  Cardiovascular: Normal rate and intact distal pulses.   Pulmonary/Chest: Effort normal. No respiratory distress.  Musculoskeletal: Normal range of motion. She exhibits tenderness (Lateral right foot tenderness with pain at the junction of the proximal 5th digit).  Good eversion and aversion of the right ankle  Neurological: She is alert and oriented to person, place, and time.       Good strength and sensation of the lower extremities.  Skin: Skin is warm and dry.  Psychiatric: She has a normal mood and affect. Her behavior is normal.    ED Course  Procedures (including critical care time)  DIAGNOSTIC STUDIES: Oxygen Saturation is 99% on room air, normal by my interpretation.    COORDINATION OF CARE:  3:47PM - right ankle XR results reviewed; results are unremarkable. Right foot XR and ibuprofen will be ordered for the pt. 4:58PM - right foot XR imaging results reviewed; results are unremarkable. Pt should f/u with orthopedist and will be Rx ibuprofen. Pt ready for d/c.   Labs  Reviewed - No data to display Dg Ankle Complete Right  09/05/2011  *RADIOLOGY REPORT*  Clinical Data: The ankle pain.  RIGHT ANKLE - COMPLETE 3+ VIEW  Comparison: No priors.  Findings: Three views of the right ankle demonstrate no acute fracture, subluxation or dislocation.  There is an old healed fracture of the distal fibula.  Small plantar calcaneal spur is incidentally noted.  IMPRESSION: 1.  No acute radiographic abnormality of the right ankle. 2.  Old healed fracture of the distal right fibula.   Original Report Authenticated By: Florencia Reasons, M.D.    Dg Foot Complete Right  09/05/2011  *RADIOLOGY REPORT*  Clinical Data: Lateral foot pain.  RIGHT FOOT COMPLETE - 3+ VIEW  Comparison: None.  Findings: Two ossicles node along the expected course of the distal tibialis posterior tendon, suggesting bifida accessory navicular.  No malalignment at the Lisfranc joint observed.  No fracture or acute bony findings.  Small plantar calcaneal spur.  IMPRESSION:  1.  No acute findings. 2.  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 3.  Small plantar calcaneal spur.   Original Report Authenticated By: Dellia Cloud, M.D.      No diagnosis found.    MDM  I personally performed the services described in this documentation, which was scribed in my presence. The recorded information has been reviewed and considered.  This generally well-appearing female presents with ongoing right ankle and foot pain.  Notably, the patient has a history of fibular fracture on this side in the distant past, and is very active in her occupation.  On exam the patient is in no distress, though she is tenderness to palpation about the lateral foot and distal lateral anterior malleolus.  The patient's x-rays are unremarkable, reassuring for the low suspicion of stress fracture or new pathology.  The patient's findings are likely secondary to her prior trauma.  We discussed the need for anti-inflammatories, ice packs, orthopedics  followup, and the patient was discharged in stable condition.    Gerhard Munch, MD 09/05/11 1753

## 2011-09-05 NOTE — ED Notes (Signed)
Pt c/o right ankle pain x 1 month; pt sts old injury but denies any new injury

## 2011-09-05 NOTE — ED Notes (Signed)
Patient given ace wrap verbalized understanding of use and applying wrap.  Patient did not want ace wrap applied at this time.

## 2011-09-26 ENCOUNTER — Encounter (HOSPITAL_COMMUNITY): Payer: Self-pay | Admitting: *Deleted

## 2011-09-26 ENCOUNTER — Emergency Department (INDEPENDENT_AMBULATORY_CARE_PROVIDER_SITE_OTHER)
Admission: EM | Admit: 2011-09-26 | Discharge: 2011-09-26 | Disposition: A | Discharge status: Home / Self Care | Payer: Self-pay | Source: Home / Self Care | Attending: Family Medicine | Admitting: Family Medicine

## 2011-09-26 DIAGNOSIS — J309 Allergic rhinitis, unspecified: Secondary | ICD-10-CM

## 2011-09-26 DIAGNOSIS — J302 Other seasonal allergic rhinitis: Secondary | ICD-10-CM

## 2011-09-26 LAB — POCT RAPID STREP A: Streptococcus, Group A Screen (Direct): NEGATIVE

## 2011-09-26 MED ORDER — FLUTICASONE PROPIONATE 50 MCG/ACT NA SUSP
1.0000 | Freq: Two times a day (BID) | NASAL | Status: DC
Start: 2011-09-26 — End: 2011-09-30

## 2011-09-26 MED ORDER — FEXOFENADINE HCL 180 MG PO TABS: 180.0000 mg | ORAL_TABLET | Freq: Every day | ORAL | Status: DC

## 2011-09-26 NOTE — ED Provider Notes (Signed)
History     CSN: [id number]  Arrival date & time 09/26/11  1212   First MD Initiated Contact with Patient 09/26/11 1230      Chief Complaint  Patient presents with  . URI  . Sore Throat    (Consider location/radiation/quality/duration/timing/severity/associated sxs/prior treatment) Patient is a 36 y.o. female presenting with URI and pharyngitis. The history is provided by the patient.  URI The primary symptoms include fever, fatigue and sore throat. Primary symptoms do not include cough, nausea, vomiting or rash. The current episode started 2 days ago. This is a new problem. The problem has been gradually improving.  Symptoms associated with the illness include congestion and rhinorrhea.  Sore Throat    Past Medical History  Diagnosis Date  . Raynaud's disease   . DVT (deep venous thrombosis)     Past Surgical History  Procedure Date  . Abdominal hysterectomy   . Cesarean section   . Appendectomy   . Hernia repair 2006    Family History  Problem Relation Age of Onset  . Diabetes Mother   . Heart disease Mother   . Hyperlipidemia Mother   . Hypertension Mother   . Other Mother     amputation, varicose veins  . Diabetes Father   . Hyperlipidemia Father   . Hypertension Father   . Peripheral vascular disease Father   . Other Father     varicose veins  . Deep vein thrombosis Brother   . Other Brother     varicose veins    History  Substance Use Topics  . Smoking status: Never Smoker   . Smokeless tobacco: Never Used  . Alcohol Use: No    OB History    Grav Para Term Preterm Abortions TAB SAB Ect Mult Living                  Review of Systems  Constitutional: Positive for fever and fatigue.  HENT: Positive for congestion, sore throat and rhinorrhea.   Respiratory: Negative for cough.   Gastrointestinal: Negative for nausea and vomiting.  Skin: Negative for rash.    Allergies  Latex  Home Medications   Current Outpatient Rx  Name Route Sig  Dispense Refill  . PANTOPRAZOLE SODIUM 40 MG PO TBEC Oral Take 40 mg by mouth daily.    Marland Kitchen FEXOFENADINE HCL 180 MG PO TABS Oral Take 1 tablet (180 mg total) by mouth daily. 30 tablet 1  . FLUTICASONE PROPIONATE 50 MCG/ACT NA SUSP Nasal Place 1 spray into the nose 2 (two) times daily. 1 g 2  . OVER THE COUNTER MEDICATION  1 application daily as needed. A&D Ointment to hands.      BP 115/81  Pulse 65  Temp 98.4 F (36.9 C)  Resp 18  SpO2 100%  Physical Exam  Nursing note and vitals reviewed. Constitutional: She is oriented to person, place, and time. She appears well-developed and well-nourished.  HENT:  Head: Normocephalic.  Right Ear: External ear normal.  Left Ear: External ear normal.  Mouth/Throat: Oropharynx is clear and moist.  Eyes: Conjunctivae normal are normal. Pupils are equal, round, and reactive to light.  Neck: Normal range of motion. Neck supple.  Cardiovascular: Normal rate, regular rhythm and normal heart sounds.   Pulmonary/Chest: Effort normal and breath sounds normal.  Lymphadenopathy:    She has no cervical adenopathy.  Neurological: She is alert and oriented to person, place, and time.  Skin: Skin is warm and dry.    ED  Course  Procedures (including critical care time)   Labs Reviewed  POCT RAPID STREP A ([facility] CARE ONLY)   No results found.   1. Seasonal allergic rhinitis       MDM          Linna Hoff, MD 09/26/11 [phone]

## 2011-09-26 NOTE — ED Notes (Signed)
Pt reports uri symptoms/ sore throat since Sunday with chills

## 2011-09-30 ENCOUNTER — Encounter (HOSPITAL_COMMUNITY): Payer: Self-pay | Admitting: Emergency Medicine

## 2011-09-30 ENCOUNTER — Emergency Department (HOSPITAL_COMMUNITY): Payer: Self-pay

## 2011-09-30 ENCOUNTER — Emergency Department (HOSPITAL_COMMUNITY)
Admission: EM | Admit: 2011-09-30 | Discharge: 2011-10-01 | Disposition: A | Discharge status: Home / Self Care | Payer: Self-pay | Attending: Emergency Medicine | Admitting: Emergency Medicine

## 2011-09-30 DIAGNOSIS — K259 Gastric ulcer, unspecified as acute or chronic, without hemorrhage or perforation: Secondary | ICD-10-CM | POA: Insufficient documentation

## 2011-09-30 DIAGNOSIS — R109 Unspecified abdominal pain: Secondary | ICD-10-CM | POA: Insufficient documentation

## 2011-09-30 DIAGNOSIS — R10816 Epigastric abdominal tenderness: Secondary | ICD-10-CM | POA: Insufficient documentation

## 2011-09-30 LAB — CBC WITH DIFFERENTIAL/PLATELET
Basophils Absolute: 0 10*3/uL (ref 0.0–0.1)
Basophils Relative: 0 % (ref 0–1)
Lymphocytes Relative: 15 % (ref 12–46)
MCHC: 32.3 g/dL (ref 30.0–36.0)
Monocytes Absolute: 0.8 10*3/uL (ref 0.1–1.0)
Neutro Abs: 7.5 10*3/uL (ref 1.7–7.7)
Neutrophils Relative %: 78 % — ABNORMAL HIGH (ref 43–77)
Platelets: 498 10*3/uL — ABNORMAL HIGH (ref 150–400)
RDW: 14.5 % (ref 11.5–15.5)
WBC: 9.6 10*3/uL (ref 4.0–10.5)

## 2011-09-30 LAB — COMPREHENSIVE METABOLIC PANEL
ALT: 8 U/L (ref 0–35)
AST: 10 U/L (ref 0–37)
Albumin: 2.9 g/dL — ABNORMAL LOW (ref 3.5–5.2)
Chloride: 102 mEq/L (ref 96–112)
Creatinine, Ser: 0.63 mg/dL (ref 0.50–1.10)
Potassium: 3.5 mEq/L (ref 3.5–5.1)
Sodium: 138 mEq/L (ref 135–145)
Total Bilirubin: 0.1 mg/dL — ABNORMAL LOW (ref 0.3–1.2)

## 2011-09-30 LAB — URINALYSIS, ROUTINE W REFLEX MICROSCOPIC
Glucose, UA: NEGATIVE mg/dL
Hgb urine dipstick: NEGATIVE
Ketones, ur: NEGATIVE mg/dL
Leukocytes, UA: NEGATIVE
pH: 7.5 (ref 5.0–8.0)

## 2011-09-30 MED ORDER — PROMETHAZINE HCL 25 MG/ML IJ SOLN
25.0000 mg | Freq: Once | INTRAMUSCULAR | Status: AC
  Administered 2011-09-30: 25 mg via INTRAVENOUS
  Filled 2011-09-30: qty 1

## 2011-09-30 MED ORDER — ONDANSETRON HCL 4 MG/2ML IJ SOLN
4.0000 mg | Freq: Once | INTRAMUSCULAR | Status: DC
  Filled 2011-09-30: qty 2

## 2011-09-30 MED ORDER — DICYCLOMINE HCL 10 MG/ML IM SOLN
20.0000 mg | Freq: Once | INTRAMUSCULAR | Status: AC
  Administered 2011-09-30: 20 mg via INTRAMUSCULAR
  Filled 2011-09-30: qty 2

## 2011-09-30 NOTE — ED Notes (Addendum)
Pt presents to ED with abdominal pain and distention. Pt reports that her abd has doubled in size in 1 day. Pt last BM was today. Pt reports N/V but no diarrhea. Pt has been taking prednisone and Goody powders for a URI that was dx 9/17. Pt reports normal urine with no sx. Pt has normal BS in all 4 quadrants. Pt denies trauma. Pt reports that eating makes her pain worse

## 2011-10-01 MED ORDER — FAMOTIDINE 20 MG PO TABS: 20.0000 mg | ORAL_TABLET | Freq: Two times a day (BID) | ORAL | Status: DC

## 2011-10-01 MED ORDER — FAMOTIDINE 20 MG PO TABS
20.0000 mg | ORAL_TABLET | Freq: Once | ORAL | Status: AC
  Administered 2011-10-01: 20 mg via ORAL
  Filled 2011-10-01: qty 1

## 2011-10-01 NOTE — ED Provider Notes (Signed)
History     CSN: [id number]  Arrival date & time 09/30/11  1734   First MD Initiated Contact with Patient 09/30/11 2010      Chief Complaint  Patient presents with  . Bloated  . Abdominal Pain    (Consider location/radiation/quality/duration/timing/severity/associated sxs/prior treatment) HPI  Presents the emergency department with complaints of abdominal bloating with some crampiness and gas. She states that the symptoms started 2 days ago and then progressively getting worse. She denies having any severe abdominal pain, lower abdominal pain, dysuria or vaginal discharge. She admits that she has had a gastric ulcer and has 2 to him set use. 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 Raynaud's disease   . DVT (deep venous thrombosis)     Past Surgical History  Procedure Date  . Abdominal hysterectomy   . Cesarean section   . Appendectomy   . Hernia repair 2006    Family History  Problem Relation Age of Onset  . Diabetes Mother   . Heart disease Mother   . Hyperlipidemia Mother   . Hypertension Mother   . Other Mother     amputation, varicose veins  . Diabetes Father   . Hyperlipidemia Father   . Hypertension Father   . Peripheral vascular disease Father   . Other Father     varicose veins  . Deep vein thrombosis Brother   . Other Brother     varicose veins    History  Substance Use Topics  . Smoking status: Never Smoker   . Smokeless tobacco: Never Used  . Alcohol Use: No    OB History    Grav Para Term Preterm Abortions TAB SAB Ect Mult Living                  Review of Systems   Review of Systems  Gen: no weight loss, fevers, chills, night sweats  Eyes: no discharge or drainage, no occular pain or visual changes  Nose: no epistaxis or rhinorrhea  Mouth: no dental pain, no sore throat  Neck: no neck pain  Lungs:No wheezing, coughing or hemoptysis CV:  no chest pain, palpitations, dependent edema or orthopnea  Abd: + abdominal pain, + nausea, - vomiting  GU: no dysuria or gross hematuria  MSK:  No abnormalities  Neuro: no headache, no focal neurologic deficits  Skin: no abnormalities Psyche: negative.    Allergies  Contrast media and Latex  Home Medications   Current Outpatient Rx  Name Route Sig Dispense Refill  . PANTOPRAZOLE SODIUM 40 MG PO TBEC Oral Take 40 mg by mouth daily.    Marland Kitchen FAMOTIDINE 20 MG PO TABS Oral Take 1 tablet (20 mg total) by mouth 2 (two) times daily. 30 tablet 0    BP 95/57  Pulse 68  Temp 99 F (37.2 C) (Oral)  Resp 16  SpO2 99%  Physical Exam  Nursing note and vitals reviewed. Constitutional: She appears well-developed and well-nourished. No distress.  HENT:  Head: Normocephalic and atraumatic.  Eyes: Pupils are equal, round, and reactive to light.  Neck: Normal range of motion. Neck supple.  Cardiovascular: Normal rate and regular rhythm.   Pulmonary/Chest: Effort normal.  Abdominal: Soft. She exhibits distension. She exhibits no mass. There is tenderness (epigastric). There is no rebound and no guarding.  Genitourinary: Rectum normal. Guaiac negative stool.  Neurological: She is alert.  Skin: Skin is  warm and dry.    ED Course  Procedures (including critical care time)  Labs Reviewed  CBC WITH DIFFERENTIAL - Abnormal; Notable for the following:    RBC 3.44 (*)     Hemoglobin 9.7 (*)     HCT 30.0 (*)     Platelets 498 (*)     Neutrophils Relative 78 (*)     All other components within normal limits  COMPREHENSIVE METABOLIC PANEL - Abnormal; Notable for the following:    Glucose, Bld 115 (*)     Calcium 8.2 (*)     Albumin 2.9 (*)     Total Bilirubin 0.1 (*)     All other components within normal limits  LIPASE, BLOOD - Abnormal; Notable for the following:    Lipase 84 (*)     All other components within normal limits  URINALYSIS, ROUTINE W REFLEX MICROSCOPIC - Abnormal; Notable  for the following:    APPearance CLOUDY (*)     All other components within normal limits   Dg Abd 1 View  10/01/2011  *RADIOLOGY REPORT*  Clinical Data: Abdominal pain, distension, nausea, diarrhea  ABDOMEN - 1 VIEW  Comparison: None.  Findings: Nonobstructive bowel gas pattern.  Visualized osseous structures are within normal limits.  IMPRESSION: No evidence of bowel obstruction.   Original Report Authenticated By: Charline Bills, M.D.    US Abdomen Complete  09/30/2011  *RADIOLOGY REPORT*  Clinical Data:  Abdominal pain, nausea/vomiting  COMPLETE ABDOMINAL ULTRASOUND  Comparison:  None.  Findings:  Gallbladder:  No gallstones, gallbladder wall thickening, or pericholecystic fluid.  Negative sonographic Murphy's sign.  Common bile duct:  Measures 4 mm.  Liver:  No focal lesion identified.  At the upper limits of normal for parenchymal echogenicity.  IVC:  Appears normal.  Pancreas:  Poorly visualized.  Spleen:  Measures 5.5 cm.  Right Kidney:  Measures 10.2 cm.  No mass or hydronephrosis.  Left Kidney:  Measures 11.0 cm.  No mass or hydronephrosis.  Abdominal aorta:  Poorly visualized due to overlying bowel gas.  IMPRESSION: Negative abdominal ultrasound.   Original Report Authenticated By: Charline Bills, M.D.      1. Gastric ulcer       MDM  pts blood count from 13 to 9.7. No symptoms. Hemoccult negative but she is having symptoms of GERD. Lipase is 85 but she is not having classic pancreatic pain. Pain relieved with Bentyl and Zofran.   I suspect another possible gastric ulcer. [place] abd/pelv negative for cholecystitis or other acute abnormality. Pt GI doctor is doctor Buccini. She has been written for Pepcid and referred back to her GI doctor.  Pt has been advised of the symptoms that warrant their return to the ED. Patient has voiced understanding and has agreed to follow-up with the PCP or specialist.         Dorthula Matas, PA 10/01/11 [phone]

## 2011-10-01 NOTE — ED Provider Notes (Signed)
Medical screening examination/treatment/procedure(s) were performed by non-physician practitioner and as supervising physician I was immediately available for consultation/collaboration.   Carleene Cooper III, MD 10/01/11 [phone]

## 2011-10-01 NOTE — ED Notes (Signed)
Pt verbalizes understanding 

## 2011-10-02 LAB — OCCULT BLOOD, POC DEVICE: Fecal Occult Bld: NEGATIVE

## 2011-11-03 ENCOUNTER — Other Ambulatory Visit (HOSPITAL_COMMUNITY): Payer: Self-pay | Admitting: Gastroenterology

## 2011-11-03 DIAGNOSIS — D649 Anemia, unspecified: Secondary | ICD-10-CM

## 2011-11-03 DIAGNOSIS — R140 Abdominal distension (gaseous): Secondary | ICD-10-CM

## 2011-11-08 ENCOUNTER — Ambulatory Visit (HOSPITAL_COMMUNITY)
Admission: RE | Admit: 2011-11-08 | Discharge: 2011-11-08 | Disposition: A | Discharge status: Home / Self Care | Payer: Self-pay | Source: Ambulatory Visit | Attending: Gastroenterology | Admitting: Gastroenterology

## 2011-11-08 ENCOUNTER — Other Ambulatory Visit (HOSPITAL_COMMUNITY): Payer: Self-pay | Admitting: Gastroenterology

## 2011-11-08 DIAGNOSIS — R140 Abdominal distension (gaseous): Secondary | ICD-10-CM

## 2011-11-08 DIAGNOSIS — D649 Anemia, unspecified: Secondary | ICD-10-CM

## 2011-11-08 DIAGNOSIS — R143 Flatulence: Secondary | ICD-10-CM | POA: Insufficient documentation

## 2011-11-08 DIAGNOSIS — R142 Eructation: Secondary | ICD-10-CM | POA: Insufficient documentation

## 2011-11-08 DIAGNOSIS — R141 Gas pain: Secondary | ICD-10-CM | POA: Insufficient documentation

## 2011-11-20 ENCOUNTER — Encounter: Payer: Self-pay | Admitting: Vascular Surgery

## 2011-11-21 ENCOUNTER — Encounter (INDEPENDENT_AMBULATORY_CARE_PROVIDER_SITE_OTHER): Payer: Self-pay | Admitting: *Deleted

## 2011-11-21 ENCOUNTER — Encounter: Payer: Self-pay | Admitting: Vascular Surgery

## 2011-11-21 ENCOUNTER — Ambulatory Visit (INDEPENDENT_AMBULATORY_CARE_PROVIDER_SITE_OTHER): Payer: Self-pay | Admitting: Vascular Surgery

## 2011-11-21 VITALS — BP 111/73 | HR 67 | Resp 18 | Ht <= 58 in | Wt 176.0 lb

## 2011-11-21 DIAGNOSIS — I8390 Asymptomatic varicose veins of unspecified lower extremity: Secondary | ICD-10-CM | POA: Insufficient documentation

## 2011-11-21 DIAGNOSIS — M7989 Other specified soft tissue disorders: Secondary | ICD-10-CM

## 2011-11-21 DIAGNOSIS — I83893 Varicose veins of bilateral lower extremities with other complications: Secondary | ICD-10-CM

## 2011-11-21 DIAGNOSIS — M79609 Pain in unspecified limb: Secondary | ICD-10-CM

## 2011-11-21 NOTE — Progress Notes (Signed)
Problems with Activities of Daily Living Secondary to Leg Pain  1. 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  This is very difficult for her due to leg pain.   2. Ms.  Gilden states she has difficulty sleeping due to leg pain.       Failure of  Conservative Therapy:  1. Worn 20-30 mm Hg thigh high compression hose >3 months with no relief of symptoms.  2. Frequently elevates legs-no relief of symptoms  3. Taken Ibuprofen 600 Mg TID with no relief of symptoms.  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 She does continue to have erythema of her hands bilaterally. I am unclear as to the etiology but suspect it is some type of rheumatologic disorder. She does continue to have discomfort in her right leg. She has pain with prolonged standing which she does in her job. She does have significant reflux in her great saphenous vein by duplex today in our office.  Discussed options with the patient. She does have pain associated with her venous reflux. I feel that she would benefit from ablation of her great saphenous vein on the right. She reports that she can has similar heavy sensation in the left leg. I did look at this with SonoSite ultrasound as a screening study and this does appear to share reflux in her left leg as well. We will continue evaluation of this and she will continue wearing her compression garments. She will determine her insurance ability and will determine if she wished to proceed with right leg laser ablation

## 2011-12-11 ENCOUNTER — Observation Stay (HOSPITAL_COMMUNITY)
Admission: EM | Admit: 2011-12-11 | Discharge: 2011-12-13 | Disposition: A | Discharge status: Home / Self Care | Payer: Self-pay | Attending: Internal Medicine | Admitting: Internal Medicine

## 2011-12-11 ENCOUNTER — Encounter (HOSPITAL_COMMUNITY): Payer: Self-pay | Admitting: *Deleted

## 2011-12-11 DIAGNOSIS — Z86718 Personal history of other venous thrombosis and embolism: Secondary | ICD-10-CM | POA: Insufficient documentation

## 2011-12-11 DIAGNOSIS — D649 Anemia, unspecified: Secondary | ICD-10-CM | POA: Insufficient documentation

## 2011-12-11 DIAGNOSIS — N9489 Other specified conditions associated with female genital organs and menstrual cycle: Secondary | ICD-10-CM | POA: Insufficient documentation

## 2011-12-11 DIAGNOSIS — D72829 Elevated white blood cell count, unspecified: Secondary | ICD-10-CM | POA: Insufficient documentation

## 2011-12-11 DIAGNOSIS — I8390 Asymptomatic varicose veins of unspecified lower extremity: Secondary | ICD-10-CM

## 2011-12-11 DIAGNOSIS — D509 Iron deficiency anemia, unspecified: Secondary | ICD-10-CM

## 2011-12-11 DIAGNOSIS — N83209 Unspecified ovarian cyst, unspecified side: Secondary | ICD-10-CM

## 2011-12-11 DIAGNOSIS — R109 Unspecified abdominal pain: Principal | ICD-10-CM | POA: Insufficient documentation

## 2011-12-11 DIAGNOSIS — M7989 Other specified soft tissue disorders: Secondary | ICD-10-CM

## 2011-12-11 DIAGNOSIS — M79609 Pain in unspecified limb: Secondary | ICD-10-CM | POA: Insufficient documentation

## 2011-12-11 LAB — COMPREHENSIVE METABOLIC PANEL
ALT: 12 U/L (ref 0–35)
Alkaline Phosphatase: 85 U/L (ref 39–117)
CO2: 25 mEq/L (ref 19–32)
Chloride: 102 mEq/L (ref 96–112)
GFR calc Af Amer: >90 mL/min (ref >90)
GFR calc non Af Amer: >90 mL/min (ref >90)
Glucose, Bld: 89 mg/dL (ref 70–99)
Potassium: 3.4 mEq/L — ABNORMAL LOW (ref 3.5–5.1)
Sodium: 137 mEq/L (ref 135–145)
Total Protein: 6.3 g/dL (ref 6.0–8.3)

## 2011-12-11 LAB — URINALYSIS, ROUTINE W REFLEX MICROSCOPIC
Nitrite: NEGATIVE
Specific Gravity, Urine: 1.005 (ref 1.005–1.030)
Urobilinogen, UA: 0.2 mg/dL (ref 0.0–1.0)

## 2011-12-11 LAB — CBC WITH DIFFERENTIAL/PLATELET
Lymphocytes Relative: 45 % (ref 12–46)
Lymphs Abs: 5.2 10*3/uL — ABNORMAL HIGH (ref 0.7–4.0)
Neutro Abs: 5 10*3/uL (ref 1.7–7.7)
Neutrophils Relative %: 43 % (ref 43–77)
Platelets: 416 10*3/uL — ABNORMAL HIGH (ref 150–400)
RBC: 3.41 MIL/uL — ABNORMAL LOW (ref 3.87–5.11)
WBC: 11.5 10*3/uL — ABNORMAL HIGH (ref 4.0–10.5)

## 2011-12-11 MED ORDER — ONDANSETRON HCL 4 MG/2ML IJ SOLN: 4.0000 mg | Freq: Once | INTRAMUSCULAR | Status: DC

## 2011-12-11 MED ORDER — SODIUM CHLORIDE 0.9 % IV SOLN
1000.0000 mL | Freq: Once | INTRAVENOUS | Status: AC
  Administered 2011-12-12: 1000 mL via INTRAVENOUS

## 2011-12-11 NOTE — ED Notes (Signed)
Pt states she has been seeing Dr Matthias Hughs since Sept for abd swelling and "loosing blood"; today states abd started swelling again; felt like she was going to pass out

## 2011-12-12 ENCOUNTER — Encounter (HOSPITAL_COMMUNITY): Payer: Self-pay | Admitting: Internal Medicine

## 2011-12-12 ENCOUNTER — Observation Stay (HOSPITAL_COMMUNITY): Payer: Self-pay

## 2011-12-12 ENCOUNTER — Emergency Department (HOSPITAL_COMMUNITY): Payer: Self-pay

## 2011-12-12 DIAGNOSIS — M79609 Pain in unspecified limb: Secondary | ICD-10-CM

## 2011-12-12 DIAGNOSIS — R109 Unspecified abdominal pain: Secondary | ICD-10-CM

## 2011-12-12 DIAGNOSIS — R6889 Other general symptoms and signs: Secondary | ICD-10-CM

## 2011-12-12 DIAGNOSIS — D649 Anemia, unspecified: Secondary | ICD-10-CM | POA: Diagnosis present

## 2011-12-12 DIAGNOSIS — N83209 Unspecified ovarian cyst, unspecified side: Secondary | ICD-10-CM

## 2011-12-12 LAB — RAPID URINE DRUG SCREEN, HOSP PERFORMED
Barbiturates: NOT DETECTED
Benzodiazepines: NOT DETECTED
Cocaine: NOT DETECTED
Tetrahydrocannabinol: NOT DETECTED

## 2011-12-12 LAB — INFLUENZA PANEL BY PCR (TYPE A & B)
H1N1 flu by pcr: NOT DETECTED
Influenza B By PCR: NEGATIVE

## 2011-12-12 LAB — CBC
HCT: 25.9 % — ABNORMAL LOW (ref 36.0–46.0)
Hemoglobin: 8.3 g/dL — ABNORMAL LOW (ref 12.0–15.0)
MCH: 25.3 pg — ABNORMAL LOW (ref 26.0–34.0)
MCHC: 30.7 g/dL (ref 30.0–36.0)
Platelets: 399 10*3/uL (ref 150–400)
Platelets: 380 10*3/uL (ref 150–400)
RBC: 3.19 MIL/uL — ABNORMAL LOW (ref 3.87–5.11)
RDW: 14.8 % (ref 11.5–15.5)
WBC: 8.9 10*3/uL (ref 4.0–10.5)

## 2011-12-12 LAB — COMPREHENSIVE METABOLIC PANEL
ALT: 14 U/L (ref 0–35)
AST: 18 U/L (ref 0–37)
Albumin: 2.8 g/dL — ABNORMAL LOW (ref 3.5–5.2)
Calcium: 8.5 mg/dL (ref 8.4–10.5)
Sodium: 138 mEq/L (ref 135–145)
Total Protein: 6 g/dL (ref 6.0–8.3)

## 2011-12-12 LAB — RETICULOCYTES
Retic Count, Absolute: 67 10*3/uL (ref 19.0–186.0)
Retic Ct Pct: 2.1 % (ref 0.4–3.1)

## 2011-12-12 LAB — D-DIMER, QUANTITATIVE: D-Dimer, Quant: <0.27 ug/mL-FEU (ref 0.00–0.48)

## 2011-12-12 LAB — OCCULT BLOOD, POC DEVICE: Fecal Occult Bld: NEGATIVE

## 2011-12-12 LAB — IRON AND TIBC: Iron: 28 ug/dL — ABNORMAL LOW (ref 42–135)

## 2011-12-12 LAB — FERRITIN: Ferritin: 4 ng/mL — ABNORMAL LOW (ref 10–291)

## 2011-12-12 LAB — VITAMIN B12: Vitamin B-12: 518 pg/mL (ref 211–911)

## 2011-12-12 LAB — TYPE AND SCREEN

## 2011-12-12 MED ORDER — TRAMADOL HCL 50 MG PO TABS
100.0000 mg | ORAL_TABLET | Freq: Four times a day (QID) | ORAL | Status: DC | PRN
  Administered 2011-12-12 – 2011-12-13 (×3): 100 mg via ORAL
  Filled 2011-12-12 (×3): qty 2

## 2011-12-12 MED ORDER — PROMETHAZINE HCL 25 MG/ML IJ SOLN
12.5000 mg | Freq: Four times a day (QID) | INTRAMUSCULAR | Status: DC | PRN
  Administered 2011-12-12 – 2011-12-13 (×2): 12.5 mg via INTRAVENOUS
  Filled 2011-12-12 (×2): qty 1

## 2011-12-12 MED ORDER — ONDANSETRON HCL 4 MG/2ML IJ SOLN: 4.0000 mg | Freq: Four times a day (QID) | INTRAMUSCULAR | Status: DC | PRN

## 2011-12-12 MED ORDER — PANTOPRAZOLE SODIUM 40 MG IV SOLR
40.0000 mg | INTRAVENOUS | Status: DC
  Administered 2011-12-12 – 2011-12-13 (×2): 40 mg via INTRAVENOUS
  Filled 2011-12-12 (×2): qty 40

## 2011-12-12 MED ORDER — OXYCODONE HCL 5 MG PO TABS
5.0000 mg | ORAL_TABLET | ORAL | Status: DC | PRN
  Filled 2011-12-12: qty 1

## 2011-12-12 MED ORDER — ACETAMINOPHEN 325 MG PO TABS
650.0000 mg | ORAL_TABLET | Freq: Once | ORAL | Status: AC
  Administered 2011-12-12: 650 mg via ORAL
  Filled 2011-12-12: qty 2

## 2011-12-12 MED ORDER — SODIUM CHLORIDE 0.9 % IJ SOLN
3.0000 mL | Freq: Two times a day (BID) | INTRAMUSCULAR | Status: DC
  Administered 2011-12-12: 3 mL via INTRAVENOUS

## 2011-12-12 MED ORDER — ACETAMINOPHEN 325 MG PO TABS: 650.0000 mg | ORAL_TABLET | Freq: Four times a day (QID) | ORAL | Status: DC | PRN

## 2011-12-12 MED ORDER — SODIUM CHLORIDE 0.9 % IV SOLN: INTRAVENOUS | Status: AC

## 2011-12-12 MED ORDER — ONDANSETRON HCL 4 MG PO TABS: 4.0000 mg | ORAL_TABLET | Freq: Four times a day (QID) | ORAL | Status: DC | PRN

## 2011-12-12 MED ORDER — ACETAMINOPHEN 650 MG RE SUPP: 650.0000 mg | Freq: Four times a day (QID) | RECTAL | Status: DC | PRN

## 2011-12-12 MED ORDER — POTASSIUM CHLORIDE CRYS ER 20 MEQ PO TBCR
40.0000 meq | EXTENDED_RELEASE_TABLET | Freq: Two times a day (BID) | ORAL | Status: AC
  Administered 2011-12-12 (×2): 40 meq via ORAL
  Filled 2011-12-12 (×2): qty 2

## 2011-12-12 NOTE — ED Notes (Signed)
Pt stating, "I just want to leave. You couldn't care less about me.  I've been here for hours and nothing has happened."  Pt given encouragement that this nurse was here now and could start the process moving along.  Pt stated that she would stay.  Pt states she is in pain but does not want pain medications because she is a recovering addict.  Pt also states an allergic reaction in the past to Zofran.  Pt calm and cooperative, apologizing at this time.

## 2011-12-12 NOTE — Progress Notes (Signed)
Patient requested something else for her headache, declined oxycodone ordered by Dr. Blake Divine stating she is a recovering drug abuser, clean for five years now.  Text paged Dr. Blake Divine.

## 2011-12-12 NOTE — Progress Notes (Signed)
[facility] PROGRESS NOTE  Dominique [name] FAO:[id number] DOB: 12-16-1975 DOA: 12/11/2011 PCP: No primary provider on file.  Assessment/Plan: 1. Abdominal pain: MILD generalized. Marland Kitchen Ob/gyn on call last night was called and recommended outpatient follow up for left adnexal mass. 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 CT abd and pelvis shows large left adnexal mass otherwise normal .  2. Normocytic hypochromic anemia - patient states Dr. Matthias Hughs has been following this as outpatient. Since patient had dizziness and weakness yesterday we had repeating her hemoglobin to make sure there is no significant fall.  3. Mild leukocytosis - UA is unremarkable. Patient has been having upper respiratory tract infection symptoms for last few days. CXR negative for pneumonia. Influenza pcr negative.  4. History of DVT and upper limb pain - this has been followed by vascular surgeon. Recent right lower extremity Doppler done last month was negative for DVT. D dimer negative.  5. Past medical history of narcotic substance abuse - which patient states she has been off this habit for more than 5 years.  6. Left adnexal mass - will need further workup per OB/GYN as outpatient.   Code Status: full code Family Communication:  Disposition Plan: 1 to 2 days  HPI/Subjective:   Objective: Filed Vitals:   12/11/11 2042 12/12/11 0704 12/12/11 0900  BP: 123/85 116/67 111/76  Pulse: 91 88 84  Temp: 99.3 F (37.4 C)  98.8 F (37.1 C)  TempSrc:   Oral  Resp: 20 18 18   Height:   4\' 7"  (1.397 m)  Weight:   81.1 kg (178 lb 12.7 oz)  SpO2: 100% 98% 98%   No intake or output data in the 24 hours ending 12/12/11 1007 Filed Weights   12/12/11 0900  Weight: 81.1 kg (178 lb 12.7 oz)    Exam:   General:  Alert afebrile comfortable  Cardiovascular: s1s2  Respiratory:  CTAB  Abdomen: soft mild generalized tenderness . ND BS+  Data Reviewed: Basic Metabolic Panel:  Lab 12/11/11 6962  NA 137  K 3.4*  CL 102  CO2 25  GLUCOSE 89  BUN 6  CREATININE 0.76  CALCIUM 8.3*  MG --  PHOS --   Liver Function Tests:  Lab 12/11/11 2055  AST 17  ALT 12  ALKPHOS 85  BILITOT 0.1*  PROT 6.3  ALBUMIN 2.8*    Lab 12/11/11 2055  LIPASE 103*  AMYLASE --   No results found for this basename: AMMONIA:5 in the last 168 hours CBC:  Lab 12/12/11 0700 12/11/11 2055  WBC 11.0* 11.5*  NEUTROABS -- 5.0  HGB 8.3* 8.6*  HCT 27.0* 28.2*  MCV 82.3 82.7  PLT 399 416*   Cardiac Enzymes: No results found for this basename: CKTOTAL:5,CKMB:5,CKMBINDEX:5,TROPONINI:5 in the last 168 hours BNP (last 3 results) No results found for this basename: PROBNP:3 in the last 8760 hours CBG: No results found for this basename: GLUCAP:5 in the last 168 hours  No results found for this or any previous visit (from the past 240 hour(s)).   Studies: Ct Abdomen Pelvis Wo Contrast  12/12/2011  *RADIOLOGY REPORT*  Clinical Data: Abdominal pain and bloating.  Decreased hemoglobin.  CT ABDOMEN AND PELVIS WITHOUT CONTRAST  Technique:  Multidetector CT imaging of the abdomen and pelvis was performed following the standard protocol without intravenous contrast.  Comparison: Abdominal ultrasound performed 09/30/2011  Findings: The  visualized lung bases are clear.  The liver and spleen are unremarkable in appearance.  The gallbladder is within normal limits.  The pancreas and adrenal glands are unremarkable.  The kidneys are unremarkable in appearance.  There is no evidence of hydronephrosis.  No renal or ureteral stones are seen.  No perinephric stranding is appreciated.  No free fluid is identified.  The small bowel is unremarkable in appearance.  The stomach is within normal limits.  No acute vascular abnormalities are seen.  The patient is status post appendectomy.  Contrast progresses to  the level of the splenic flexure of the colon.  The colon is unremarkable in appearance.  The bladder is mildly distended and grossly unremarkable in appearance.  The patient is status post hysterectomy.  There is a mildly complex 5.2 x 2.6 x 3.6 cm mass at the left adnexa; if the patient still has a left ovary, this could simply reflect the left ovary.  Would correlate with clinical history.  No inguinal lymphadenopathy is seen.  No acute osseous abnormalities are identified.  IMPRESSION:  1.  5.2 x 2.6 x 3.6 cm left adnexal mass may simply reflect the left ovary, if the patient's left ovary has not been removed.  It would remain within normal limits in size for ovarian tissue, given the patient's age.  Would correlate with the patient's clinical history. 2.  Otherwise unremarkable CT of the abdomen and pelvis.   Original Report Authenticated By: Tonia Ghent, M.D.     Scheduled Meds:   . [COMPLETED] sodium chloride  1,000 mL Intravenous Once  . [COMPLETED] acetaminophen  650 mg Oral Once  . pantoprazole (PROTONIX) IV  40 mg Intravenous Q24H  . sodium chloride  3 mL Intravenous Q12H  . [DISCONTINUED] ondansetron (ZOFRAN) IV  4 mg Intravenous Once   Continuous Infusions:   . sodium chloride      Principal Problem:  *Abdominal discomfort Active Problems:  Pain in limb  Anemia        Dominique [name]  [facility] Pager [phone]. If 8PM-8AM, please contact night-coverage at [email or web address], password [facility] 12/12/2011, 10:07 AM  LOS: 1 day

## 2011-12-12 NOTE — Progress Notes (Signed)
Doctor in to see patient, new medication for pain ordered and patient ok with taking this medication.  See MAR for administration for headache

## 2011-12-12 NOTE — Progress Notes (Signed)
Received from ED via stretcher, alert and oriented, has headache that she received tylenol for in the ED.  Placed on telemetry #[id number].  Notified Dr. Blake Divine of patient's arrival to unit.  Oriented to the room, call light and viewed safety video.

## 2011-12-12 NOTE — H&P (Signed)
Dominique [name] is an 36 y.o. female.   Patient was seen and examined on November 12, 2011. PCP - used to be [facility]. Chief Complaint: Abdominal discomfort. 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 Patient has been having bloating sensation for last few days which worsened acutely yesterday to the extent that patient felt even having some difficulty breathing. In the ER in addition patient's blood work showed that patient had anemia and her hemoglobin has been normal last year and has decreased to 9.7 to September with further decrease today. Patient states she's been worked up by Dr. Matthias Hughs gastroenterologist for her anemia. She has had EGD last year which showed Schatzki's ring and had required dilation. Patient denies any nausea vomiting or diarrhea. Patient has been taking ibuprofen for extremity pain. Denies noticing any black stools. Since patient felt weak and dizzy at this time patient has been admitted for further observation. CT abdomen and pelvis done in the ER does not show any acute except for left adnexal mass and ER physician had discussed with OB/GYN on-call Dr. Debroah Loop who has advised outpatient followup and if needed they will see patient in consult.  Past Medical History  Diagnosis Date  . Raynaud's disease   . DVT (deep venous thrombosis)     Past Surgical History  Procedure Date  . Abdominal hysterectomy   . Cesarean section   . Appendectomy   . Hernia repair 2006    Family History  Problem Relation Age of Onset  . Diabetes Mother   . Heart disease Mother   . Hyperlipidemia Mother   . Hypertension Mother   . Other Mother     amputation, varicose veins  . Diabetes Father   . Hyperlipidemia Father   . Hypertension Father   . Peripheral vascular disease Father   . Other Father     varicose veins  .  Deep vein thrombosis Brother   . Other Brother     varicose veins   Social History:  reports that she has never smoked. She has never used smokeless tobacco. She reports that she does not drink alcohol or use illicit drugs.  Allergies:  Allergies  Allergen Reactions  . Contrast Media (Iodinated Diagnostic Agents) Anaphylaxis  . Latex Anaphylaxis     (Not in a hospital admission)  Results for orders placed during the hospital encounter of 12/11/11 (from the past 48 hour(s))  URINALYSIS, ROUTINE W REFLEX MICROSCOPIC     Status: Normal   Collection Time   12/11/11  8:44 PM      Component Value Range Comment   Color, Urine YELLOW  YELLOW    APPearance CLEAR  CLEAR    Specific Gravity, Urine 1.005  1.005 - 1.030    pH 7.0  5.0 - 8.0    Glucose, UA NEGATIVE  NEGATIVE mg/dL    Hgb urine dipstick NEGATIVE  NEGATIVE    Bilirubin Urine NEGATIVE  NEGATIVE    Ketones, ur NEGATIVE  NEGATIVE mg/dL    Protein, ur NEGATIVE  NEGATIVE mg/dL    Urobilinogen, UA 0.2  0.0 - 1.0 mg/dL    Nitrite NEGATIVE  NEGATIVE    Leukocytes, UA NEGATIVE  NEGATIVE MICROSCOPIC NOT DONE ON URINES WITH NEGATIVE PROTEIN, BLOOD, LEUKOCYTES, NITRITE, OR GLUCOSE <1000 mg/dL.  CBC WITH DIFFERENTIAL     Status: Abnormal   Collection Time  12/11/11  8:55 PM      Component Value Range Comment   WBC 11.5 (*) 4.0 - 10.5 K/uL    RBC 3.41 (*) 3.87 - 5.11 MIL/uL    Hemoglobin 8.6 (*) 12.0 - 15.0 g/dL    HCT 29.5 (*) 62.1 - 46.0 %    MCV 82.7  78.0 - 100.0 fL    MCH 25.2 (*) 26.0 - 34.0 pg    MCHC 30.5  30.0 - 36.0 g/dL    RDW 30.8  65.7 - 84.6 %    Platelets 416 (*) 150 - 400 K/uL    Neutrophils Relative 43  43 - 77 %    Neutro Abs 5.0  1.7 - 7.7 K/uL    Lymphocytes Relative 45  12 - 46 %    Lymphs Abs 5.2 (*) 0.7 - 4.0 K/uL    Monocytes Relative 9  3 - 12 %    Monocytes Absolute 1.1 (*) 0.1 - 1.0 K/uL    Eosinophils Relative 3  0 - 5 %    Eosinophils Absolute 0.3  0.0 - 0.7 K/uL    Basophils Relative 0  0 - 1 %     Basophils Absolute 0.0  0.0 - 0.1 K/uL   COMPREHENSIVE METABOLIC PANEL     Status: Abnormal   Collection Time   12/11/11  8:55 PM      Component Value Range Comment   Sodium 137  135 - 145 mEq/L    Potassium 3.4 (*) 3.5 - 5.1 mEq/L    Chloride 102  96 - 112 mEq/L    CO2 25  19 - 32 mEq/L    Glucose, Bld 89  70 - 99 mg/dL    BUN 6  6 - 23 mg/dL    Creatinine, Ser 9.62  0.50 - 1.10 mg/dL    Calcium 8.3 (*) 8.4 - 10.5 mg/dL    Total Protein 6.3  6.0 - 8.3 g/dL    Albumin 2.8 (*) 3.5 - 5.2 g/dL    AST 17  0 - 37 U/L    ALT 12  0 - 35 U/L    Alkaline Phosphatase 85  39 - 117 U/L    Total Bilirubin 0.1 (*) 0.3 - 1.2 mg/dL    GFR calc non Af Amer >90  >90 mL/min    GFR calc Af Amer >90  >90 mL/min   LIPASE, BLOOD     Status: Abnormal   Collection Time   12/11/11  8:55 PM      Component Value Range Comment   Lipase 103 (*) 11 - 59 U/L   URINE RAPID DRUG SCREEN (HOSP PERFORMED)     Status: Normal   Collection Time   12/12/11  2:15 AM      Component Value Range Comment   Opiates NONE DETECTED  NONE DETECTED    Cocaine NONE DETECTED  NONE DETECTED    Benzodiazepines NONE DETECTED  NONE DETECTED    Amphetamines NONE DETECTED  NONE DETECTED    Tetrahydrocannabinol NONE DETECTED  NONE DETECTED    Barbiturates NONE DETECTED  NONE DETECTED   OCCULT BLOOD, POC DEVICE     Status: Normal   Collection Time   12/12/11  2:55 AM      Component Value Range Comment   Fecal Occult Bld NEGATIVE      Ct Abdomen Pelvis Wo Contrast  12/12/2011  *RADIOLOGY REPORT*  Clinical Data: Abdominal pain and bloating.  Decreased hemoglobin.  CT ABDOMEN AND  PELVIS WITHOUT CONTRAST  Technique:  Multidetector CT imaging of the abdomen and pelvis was performed following the standard protocol without intravenous contrast.  Comparison: Abdominal ultrasound performed 09/30/2011  Findings: The visualized lung bases are clear.  The liver and spleen are unremarkable in appearance.  The gallbladder is within normal limits.   The pancreas and adrenal glands are unremarkable.  The kidneys are unremarkable in appearance.  There is no evidence of hydronephrosis.  No renal or ureteral stones are seen.  No perinephric stranding is appreciated.  No free fluid is identified.  The small bowel is unremarkable in appearance.  The stomach is within normal limits.  No acute vascular abnormalities are seen.  The patient is status post appendectomy.  Contrast progresses to the level of the splenic flexure of the colon.  The colon is unremarkable in appearance.  The bladder is mildly distended and grossly unremarkable in appearance.  The patient is status post hysterectomy.  There is a mildly complex 5.2 x 2.6 x 3.6 cm mass at the left adnexa; if the patient still has a left ovary, this could simply reflect the left ovary.  Would correlate with clinical history.  No inguinal lymphadenopathy is seen.  No acute osseous abnormalities are identified.  IMPRESSION:  1.  5.2 x 2.6 x 3.6 cm left adnexal mass may simply reflect the left ovary, if the patient's left ovary has not been removed.  It would remain within normal limits in size for ovarian tissue, given the patient's age.  Would correlate with the patient's clinical history. 2.  Otherwise unremarkable CT of the abdomen and pelvis.   Original Report Authenticated By: Tonia Ghent, M.D.     Review of Systems  HENT: Negative.   Eyes: Negative.   Respiratory: Negative.   Cardiovascular: Negative.   Gastrointestinal:       Abdominal discomfort.  Genitourinary: Negative.   Musculoskeletal: Negative.   Skin: Negative.   Neurological: Positive for dizziness and weakness.  Psychiatric/Behavioral: Negative.     Blood pressure 123/85, pulse 91, temperature 99.3 F (37.4 C), resp. rate 20, SpO2 100.00%. Physical Exam  Constitutional: She is oriented to person, place, and time. She appears well-developed and well-nourished. No distress.  HENT:  Head: Normocephalic and atraumatic.  Right  Ear: External ear normal.  Left Ear: External ear normal.  Nose: Nose normal.  Mouth/Throat: Oropharynx is clear and moist. No oropharyngeal exudate.  Eyes: Conjunctivae normal are normal. Pupils are equal, round, and reactive to light. Right eye exhibits no discharge. Left eye exhibits no discharge. No scleral icterus.  Neck: Normal range of motion. Neck supple.  Cardiovascular: Normal rate and regular rhythm.   Respiratory: Effort normal and breath sounds normal.  GI: Soft. Bowel sounds are normal. She exhibits distension. There is no tenderness. There is no rebound and no guarding.  Musculoskeletal: She exhibits edema. She exhibits no tenderness.  Neurological: She is alert and oriented to person, place, and time.       Moves all extremities.  Skin: Skin is warm and dry. She is not diaphoretic.     Assessment/Plan #1. Abdominal discomfort with bloating sensation - CAT scan of the abdomen and pelvis does not show any acute. Patient does have history of IBS which may be contributing to her symptoms. At this time I place patient on clear liquid diet which can be slowly advanced. Add protonix. #2. Normocytic hypochromic anemia - patient states Dr. Matthias Hughs has been following this as outpatient. Since patient had dizziness and  weakness yesterday we had repeating her hemoglobin to make sure there is no significant fall. Check anemia panel. #3. Mild leukocytosis - UA is unremarkable. Patient has been having upper respiratory tract infection symptoms for last few days. Check chest x-ray. I have also ordered flu panel. #4. History of DVT and upper limb pain - this has been followed by vascular surgeon. Recent right lower extremity Doppler done last month was negative for DVT. We'll check d-dimer. #5. Past medical history of narcotic substance abuse - which patient states she has been off this habit for more than 5 years. #6. Left adnexal mass - will need further workup to OB/GYN.  CODE STATUS - full  code.  Kiril Hippe N. 12/12/2011, 7:00 AM

## 2011-12-12 NOTE — ED Provider Notes (Signed)
History     CSN: [id number]  Arrival date & time 12/11/11  2030   First MD Initiated Contact with Patient 12/12/11 0020      Chief Complaint  Patient presents with  . abd swelling     (Consider location/radiation/quality/duration/timing/severity/associated sxs/prior treatment) HPI Comments: Patient with Hx abdominal bloating has been seen by Dr. Matthias Hughs in the past for same has had numerous tests with definitive cause for anemia and abdominal bloating   Toady patient states she has had acute onset significant bloating and weakness.  The history is provided by the patient.    Past Medical History  Diagnosis Date  . Raynaud's disease   . DVT (deep venous thrombosis)     Past Surgical History  Procedure Date  . Abdominal hysterectomy   . Cesarean section   . Appendectomy   . Hernia repair 2006    Family History  Problem Relation Age of Onset  . Diabetes Mother   . Heart disease Mother   . Hyperlipidemia Mother   . Hypertension Mother   . Other Mother     amputation, varicose veins  . Diabetes Father   . Hyperlipidemia Father   . Hypertension Father   . Peripheral vascular disease Father   . Other Father     varicose veins  . Deep vein thrombosis Brother   . Other Brother     varicose veins    History  Substance Use Topics  . Smoking status: Never Smoker   . Smokeless tobacco: Never Used  . Alcohol Use: No    OB History    Grav Para Term Preterm Abortions TAB SAB Ect Mult Living                  Review of Systems  Constitutional: Negative for fever and chills.  HENT: Negative.   Respiratory: Negative for shortness of breath.   Cardiovascular: Negative for chest pain and leg swelling.  Gastrointestinal: Positive for abdominal distention. Negative for nausea, vomiting, abdominal pain, diarrhea, constipation, blood in stool and anal bleeding.  Genitourinary: Negative for dysuria and vaginal pain.  Musculoskeletal: Negative for arthralgias.   Neurological: Positive for weakness. Negative for dizziness and headaches.    Allergies  Contrast media and Latex  Home Medications   Current Outpatient Rx  Name  Route  Sig  Dispense  Refill  . ACETAMINOPHEN 500 MG PO TABS   Oral   Take 1,000 mg by mouth every 6 (six) hours as needed. For pain.         . IBUPROFEN 200 MG PO TABS   Oral   Take 400 mg by mouth every 6 (six) hours as needed. For pain.         . ADULT MULTIVITAMIN W/MINERALS CH   Oral   Take 1 tablet by mouth daily.         Marland Kitchen OMEPRAZOLE 20 MG PO CPDR   Oral   Take 20 mg by mouth daily.         Marland Kitchen FAMOTIDINE 20 MG PO TABS   Oral   Take 20 mg by mouth 2 (two) times daily as needed. For acid reflux           BP 123/85  Pulse 91  Temp 99.3 F (37.4 C)  Resp 20  SpO2 100%  Physical Exam  Constitutional: She is oriented to person, place, and time. She appears well-developed and well-nourished.  HENT:  Head: Normocephalic.  Neck: Normal range of motion.  Cardiovascular: Normal rate.   Pulmonary/Chest: Effort normal.  Abdominal: Soft. She exhibits distension. There is no tenderness. There is no rebound.  Genitourinary: Guaiac positive stool.  Musculoskeletal: Normal range of motion. She exhibits no edema.  Neurological: She is alert and oriented to person, place, and time.  Skin: No rash noted.    ED Course  Procedures (including critical care time)  Labs Reviewed  CBC WITH DIFFERENTIAL - Abnormal; Notable for the following:    WBC 11.5 (*)     RBC 3.41 (*)     Hemoglobin 8.6 (*)     HCT 28.2 (*)     MCH 25.2 (*)     Platelets 416 (*)     Lymphs Abs 5.2 (*)     Monocytes Absolute 1.1 (*)     All other components within normal limits  COMPREHENSIVE METABOLIC PANEL - Abnormal; Notable for the following:    Potassium 3.4 (*)     Calcium 8.3 (*)     Albumin 2.8 (*)     Total Bilirubin 0.1 (*)     All other components within normal limits  LIPASE, BLOOD - Abnormal; Notable for the  following:    Lipase 103 (*)     All other components within normal limits  URINALYSIS, ROUTINE W REFLEX MICROSCOPIC  URINE RAPID DRUG SCREEN (HOSP PERFORMED)   No results found.   No diagnosis found.    MDM  Spoke with Dr. Debroah Loop --OB/GYN who will be glad to consult    Dr. Toniann Fail requested a repeat CBC      Arman Filter, NP 12/12/11 0559  Arman Filter, NP 12/12/11 [phone]

## 2011-12-12 NOTE — ED Notes (Signed)
Pt back from CT

## 2011-12-12 NOTE — ED Provider Notes (Signed)
Medical screening examination/treatment/procedure(s) were performed by non-physician practitioner and as supervising physician I was immediately available for consultation/collaboration.    Mariyanna Mucha L Ashari Llewellyn, MD 12/12/11 0913 

## 2011-12-12 NOTE — ED Notes (Signed)
Pt to CT

## 2011-12-13 DIAGNOSIS — D509 Iron deficiency anemia, unspecified: Secondary | ICD-10-CM

## 2011-12-13 LAB — BASIC METABOLIC PANEL
CO2: 23 mEq/L (ref 19–32)
Chloride: 103 mEq/L (ref 96–112)
GFR calc non Af Amer: >90 mL/min (ref >90)
Glucose, Bld: 82 mg/dL (ref 70–99)
Potassium: 4.2 mEq/L (ref 3.5–5.1)
Sodium: 136 mEq/L (ref 135–145)

## 2011-12-13 LAB — CBC
Hemoglobin: 8.9 g/dL — ABNORMAL LOW (ref 12.0–15.0)
MCH: 25.4 pg — ABNORMAL LOW (ref 26.0–34.0)
MCV: 82.3 fL (ref 78.0–100.0)
RBC: 3.51 MIL/uL — ABNORMAL LOW (ref 3.87–5.11)

## 2011-12-13 MED ORDER — SODIUM CHLORIDE 0.9 % IV SOLN
125.0000 mg | Freq: Once | INTRAVENOUS | Status: AC
  Administered 2011-12-13: 125 mg via INTRAVENOUS
  Filled 2011-12-13: qty 10

## 2011-12-13 NOTE — Progress Notes (Signed)
Patient discharged to home.  Reviewed all discharge instructions, medications with patient.  IV pulled from left posterior forearm.  Patient without further questions at this time.  Patient escorted to lobby via wheelchair by nursing tech.  Patient discharged.

## 2011-12-13 NOTE — Discharge Summary (Signed)
Physician Discharge Summary  Dominique [name] ZOX:[id number] DOB: 1975/05/22 DOA: 12/11/2011  PCP: No primary provider on file.  Admit date: 12/11/2011 Discharge date: 12/13/2011  Recommendations for Outpatient Follow-up:  1. Pt will need to follow up with PCP in 2 weeks post discharge 2. Followup w/ Dr. Matthias Hughs in 1 week--pt already has appt 3. Follow up with OBGYN in 1-2 weeks for Left adnexal mass  Discharge Diagnoses:  Principal Problem:  *Abdominal discomfort Active Problems:  Pain in limb  Anemia 1. Abdominal pain: MILD generalized. Mar[facility] Kitchen Ob/gyn t was called and recommended outpatient follow up for left adnexal mass. 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 CT abd and pelvis shows large left adnexal mass otherwise normal.  patient's abdominal pain improved significantly on the day of discharge. She is able tolerate a regular diet.  2. Normocytic hypochromic anemia - patient states Dr. Matthias Hughs has been following this as outpatient. Since patient had dizziness and weakness yesterday we had repeating her hemoglobin to make sure there is no significant fall. The patient was found to have iron deficiency anemia with iron saturation of 8%. The patient was transfused with ferric gluconate IV. Fecal occult blood was negative. 3. Mild leukocytosis - UA is unremarkable. No fevers. Resolved without antibiotics. 4. History of DVT and upper limb pain - this has been followed by vascular surgeon. Recent right lower extremity Doppler done last month was negative for DVT. D dimer negative.  5. Past medical history of narcotic substance abuse - which patient states she has been off this habit for more than 5 years. Urine toxicology was negative. 6. Left adnexal mass - will need further workup per OB/GYN as outpatient.   Discharge Condition: Stable  Disposition:  discharge  home  Diet: Regular Wt Readings from Last 3 Encounters:  12/12/11 81.1 kg (178 lb 12.7 oz)  11/21/11 79.833 kg (176 lb)  08/21/11 78.926 kg (174 lb)    History of present illness:  36 year old female with history of DVT 13 years ago, upper limb pain being followed by vascular surgery (earlier thought was secondary to Raynaud's) presents with complaints of abdominal discomfort. Patient has been having bloating sensation for last few days which worsened acutely yesterday to the extent that patient felt even having some difficulty breathing. In the ER in addition patient's blood work showed that patient had anemia and her hemoglobin has been normal last year and has decreased to 9.7 to September with further decrease today. Patient states she's been worked up by Dr. Matthias Hughs gastroenterologist for her anemia. She has had EGD last year which showed Schatzki's ring and had required dilation. Patient denies any nausea vomiting or diarrhea. Patient has been taking ibuprofen for extremity pain. Denies noticing any black stools. Since patient felt weak and dizzy at this time patient has been admitted for further observation.  CT abdomen and pelvis done in the ER does not show any acute except for left adnexal mass and ER physician had discussed with OB/GYN on-call Dr. Debroah Loop who has advised outpatient followup and if needed they will see patient in consult, but they did not feel that this was contributed to the patient's abdominal pain.   Hospital Course:  The patient was given intravenous fluids. She was given analgesics for her pain,, but did not want to take any opioids. The patient was started on a clear liquid diet. She  did have some episodes of nausea and vomiting. However this gradually improved. On the day of discharge, The patient asked to eat "real food". Her diet was advanced. The patient tolerated her diet without problems.  The patient was found to have iron deficiency with iron saturation of 8%  and ferritin of 4. The patient stated that she has had much difficulty tolerating oral iron at home. As a result, she would prefer not to oral iron at this time. Patient states that she would rather followup with her gastroenterologist regarding determination of oral iron supplement for her. Patient was given intravenous dose of ferric gluconate IV. She was instructed to followup with her gastroenterologist, Dr. Matthias Hughs regarding her iron deficiency and GI knees. The patient stated she has a followup appointment in one week. She was instructed to keep this appointment. Further workup revealed that her urinalysis was negative, urine toxicology was negative, fecal occult blood was negative. Venous ultrasound of her right lower extremity on 11/21/2011 was negative. Lipase was 82. Hepatic enzymes were negative. The patient is status post hysterectomy. The patient's hemoglobin remained stable throughout the hospitalization. On the day of discharge, her hemoglobin was 8.9, hematocrit 28.9. She remained hemodynamically stable throughout the hospitalization. The patient's right lower extremity pain, d-dimer was negative. Additional workup revealed B12 level was in the mid range, 518. Folic acid was >20. Fecal occult blood test was negative. Urine drug screen was negative. Urinalysis was not consistent with UTI. Hepatic enzymes were normal. Lipase was minimally elevated at 83. This was not thought to be clinically significant at this time given CT abdomen and pelvis was negative for any signs of pancreatitis.    Discharge Exam: Filed Vitals:   12/13/11 0600  BP: 103/71  Pulse: 80  Temp: 98.7 F (37.1 C)  Resp: 18   Filed Vitals:   12/12/11 0900 12/12/11 1424 12/12/11 2200 12/13/11 0600  BP: 111/76 106/72 115/66 103/71  Pulse: 84 82 74 80  Temp: 98.8 F (37.1 C) 98.3 F (36.8 C) 98.6 F (37 C) 98.7 F (37.1 C)  TempSrc: Oral Oral Oral Oral  Resp: 18 18 18 18   Height: 4\' 7"  (1.397 m)     Weight: 81.1  kg (178 lb 12.7 oz)     SpO2: 98% 98% 98% 97%   General: A&O x 3, NAD, pleasant, cooperative Cardiovascular: RRR, no rub, no gallop, no S3 Respiratory: CTAB, no wheeze, no rhonchi Abdomen:soft, nontender, nondistended, positive bowel sounds Extremities: No edema, No lymphangitis, no petechiae  Discharge Instructions      Discharge Orders    Future Appointments: Provider: Department: Dept Phone: Center:   12/20/2011 9:45 AM Ardyth Gal, MD [facility] [phone] [facility]     Future Orders Please Complete By Expires   Diet - low sodium heart healthy      Increase activity slowly      Discharge instructions      Comments:   Follow up with gastroenterology regarding best and most tolerable iron supplement Follow up with your OBGYN and primary care physician regarding ovarian abnormality       Medication List     As of 12/13/2011 12:55 PM    STOP taking these medications         ibuprofen 200 MG tablet   Commonly known as: ADVIL,MOTRIN      TAKE these medications         acetaminophen 500 MG tablet   Commonly known as: TYLENOL   Take 1,000  mg by mouth every 6 (six) hours as needed. For pain.      famotidine 20 MG tablet   Commonly known as: PEPCID   Take 20 mg by mouth 2 (two) times daily as needed. For acid reflux      multivitamin with minerals Tabs   Take 1 tablet by mouth daily.      omeprazole 20 MG capsule   Commonly known as: PRILOSEC   Take 20 mg by mouth daily.          The results of significant diagnostics from this hospitalization (including imaging, microbiology, ancillary and laboratory) are listed below for reference.    Significant Diagnostic Studies: Ct Abdomen Pelvis Wo Contrast  12/12/2011  *RADIOLOGY REPORT*  Clinical Data: Abdominal pain and bloating.  Decreased hemoglobin.  CT ABDOMEN AND PELVIS WITHOUT CONTRAST  Technique:  Multidetector CT imaging of the abdomen and pelvis was performed following the standard  protocol without intravenous contrast.  Comparison: Abdominal ultrasound performed 09/30/2011  Findings: The visualized lung bases are clear.  The liver and spleen are unremarkable in appearance.  The gallbladder is within normal limits.  The pancreas and adrenal glands are unremarkable.  The kidneys are unremarkable in appearance.  There is no evidence of hydronephrosis.  No renal or ureteral stones are seen.  No perinephric stranding is appreciated.  No free fluid is identified.  The small bowel is unremarkable in appearance.  The stomach is within normal limits.  No acute vascular abnormalities are seen.  The patient is status post appendectomy.  Contrast progresses to the level of the splenic flexure of the colon.  The colon is unremarkable in appearance.  The bladder is mildly distended and grossly unremarkable in appearance.  The patient is status post hysterectomy.  There is a mildly complex 5.2 x 2.6 x 3.6 cm mass at the left adnexa; if the patient still has a left ovary, this could simply reflect the left ovary.  Would correlate with clinical history.  No inguinal lymphadenopathy is seen.  No acute osseous abnormalities are identified.  IMPRESSION:  1.  5.2 x 2.6 x 3.6 cm left adnexal mass may simply reflect the left ovary, if the patient's left ovary has not been removed.  It would remain within normal limits in size for ovarian tissue, given the patient's age.  Would correlate with the patient's clinical history. 2.  Otherwise unremarkable CT of the abdomen and pelvis.   Original Report Authenticated By: Tonia Ghent, M.D.    Dg Chest 2 View  12/12/2011  *RADIOLOGY REPORT*  Clinical Data: Shortness of breath.  CHEST - 2 VIEW  Comparison: 09/21/2010  Findings: The heart, mediastinum and hila are within normal limits. The lungs are clear.  No pleural effusion or pneumothorax.  The bony thorax and surrounding soft tissues are unremarkable.  IMPRESSION: Normal chest radiographs.   Original Report  Authenticated By: Amie Portland, M.D.      Microbiology: No results found for this or any previous visit (from the past 240 hour(s)).   Labs: Basic Metabolic Panel:  Lab 12/13/11 5784 12/12/11 1040 12/11/11 2055  NA 136 138 137  K 4.2 3.4* --  CL 103 103 102  CO2 23 26 25   GLUCOSE 82 89 89  BUN 6 5* 6  CREATININE 0.59 0.63 0.76  CALCIUM 8.9 8.5 8.3*  MG -- -- --  PHOS -- -- --   Liver Function Tests:  Lab 12/12/11 1040 12/11/11 2055  AST 18 17  ALT 14  12  ALKPHOS 81 85  BILITOT 0.2* 0.1*  PROT 6.0 6.3  ALBUMIN 2.8* 2.8*    Lab 12/12/11 1040 12/11/11 2055  LIPASE 83* 103*  AMYLASE -- --   No results found for this basename: AMMONIA:5 in the last 168 hours CBC:  Lab 12/13/11 0525 12/12/11 1040 12/12/11 0700 12/11/11 2055  WBC 9.7 8.9 11.0* 11.5*  NEUTROABS -- -- -- 5.0  HGB 8.9* 8.4* 8.3* 8.6*  HCT 28.9* 25.9* 27.0* 28.2*  MCV 82.3 81.2 82.3 82.7  PLT 398 380 399 416*   Cardiac Enzymes: No results found for this basename: CKTOTAL:5,CKMB:5,CKMBINDEX:5,TROPONINI:5 in the last 168 hours BNP: No components found with this basename: POCBNP:5 CBG: No results found for this basename: GLUCAP:5 in the last 168 hours  Time coordinating discharge:  Greater than 30 minutes  Signed:  [name], DO [facility] Pager: [phone] 12/13/2011, 12:55 PM

## 2011-12-20 ENCOUNTER — Ambulatory Visit: Payer: Self-pay | Admitting: Family Medicine

## 2011-12-20 ENCOUNTER — Ambulatory Visit (INDEPENDENT_AMBULATORY_CARE_PROVIDER_SITE_OTHER): Payer: Self-pay | Admitting: Family Medicine

## 2011-12-20 ENCOUNTER — Encounter: Payer: Self-pay | Admitting: Family Medicine

## 2011-12-20 VITALS — BP 116/74 | HR 66 | Temp 99.3°F | Ht <= 58 in | Wt 188.2 lb

## 2011-12-20 DIAGNOSIS — I7300 Raynaud's syndrome without gangrene: Secondary | ICD-10-CM

## 2011-12-20 DIAGNOSIS — M7989 Other specified soft tissue disorders: Secondary | ICD-10-CM

## 2011-12-20 DIAGNOSIS — J069 Acute upper respiratory infection, unspecified: Secondary | ICD-10-CM

## 2011-12-20 MED ORDER — FLUCONAZOLE 150 MG PO TABS: 150.0000 mg | ORAL_TABLET | Freq: Once | ORAL | Status: DC

## 2011-12-20 NOTE — Patient Instructions (Signed)
It was nice to meet you.  Please be sure to call and make an appointment to get your [facility] card.  After you get the card, please make an appointment to see me so we can do your health maintenance lab work.   You have a viral cold, be sure to drink plenty of fluids and get lots of rest.  You can use over the counter cough medications and cold medications as needed.

## 2011-12-21 ENCOUNTER — Encounter: Payer: Self-pay | Admitting: Family Medicine

## 2011-12-21 DIAGNOSIS — J069 Acute upper respiratory infection, unspecified: Secondary | ICD-10-CM | POA: Insufficient documentation

## 2011-12-21 DIAGNOSIS — I7300 Raynaud's syndrome without gangrene: Secondary | ICD-10-CM

## 2011-12-21 HISTORY — DX: Raynaud's syndrome without gangrene: I73.00

## 2011-12-21 NOTE — Assessment & Plan Note (Signed)
No red flags in history or exam, discussed symptomatic care.

## 2011-12-21 NOTE — Assessment & Plan Note (Signed)
Discussed wearing compression stockings to help with chronic swelling.

## 2011-12-21 NOTE — Assessment & Plan Note (Signed)
Unclear if true raynaud's - will try to get records and re-refer to rheumatology if possible when she gets the orange card.

## 2011-12-21 NOTE — Progress Notes (Signed)
  Subjective:    Patient ID: Dominique [name], female    DOB: 12/29/1975, 36 y.o.   MRN: [id number]  HPI  Dominique [name] comes in to establish care.  Her only complaint today is a runny nose and cough that has been going on for a week.  She has not had fevers or chills or dyspnea.    She says she has been told for a long time that she had Raynaud's disease.  However, in the past year she saw a Rheumatologist, who did not think this was Raynaud's.  They sent her to vascular surgery, who did not think it was a circulation problem.    She has a history of a DVT in her right lower leg, and does have some chronic swelling in her legs.   Past Medical History  Diagnosis Date  . Raynaud's disease   . DVT (deep venous thrombosis)    Family History  Problem Relation Age of Onset  . Diabetes Mother   . Heart disease Mother   . Hyperlipidemia Mother   . Hypertension Mother   . Other Mother     amputation, varicose veins  . Diabetes Father   . Hyperlipidemia Father   . Hypertension Father   . Peripheral vascular disease Father   . Other Father     varicose veins  . Deep vein thrombosis Brother   . Other Brother     varicose veins   History  Substance Use Topics  . Smoking status: Never Smoker   . Smokeless tobacco: Never Used  . Alcohol Use: No    Review of Systems Pertinent items in HPI    Objective:   Physical Exam BP 116/74  Pulse 66  Temp 99.3 F (37.4 C) (Oral)  Ht 4\' 7"  (1.397 m)  Wt 188 lb 3.2 oz (85.367 kg)  BMI 43.74 kg/m2 General appearance: alert, cooperative and no distress Ears: normal TM's and external ear canals both ears Nose: clear discharge Throat: lips, mucosa, and tongue normal; teeth and gums normal Lungs: clear to auscultation bilaterally Heart: regular rate and rhythm, S1, S2 normal, no murmur, click, rub or gallop Extremities: extremities normal, atraumatic, no cyanosis or edema       Assessment & Plan:

## 2012-01-02 ENCOUNTER — Encounter (HOSPITAL_COMMUNITY): Payer: Self-pay | Admitting: Pharmacy Technician

## 2012-01-05 ENCOUNTER — Encounter (HOSPITAL_COMMUNITY): Payer: Self-pay | Admitting: *Deleted

## 2012-01-05 NOTE — Pre-Procedure Instructions (Signed)
Your procedure is scheduled AV:[id number], January 23, 2012 Report to [facility] Admitting [id number] Call this number if you have problems morning of your [phone]  Follow all bowel prep instructions per your doctor's orders.  Do not eat or drink anything after midnight the night before your procedure. You may brush your teeth, rinse out your mouth, but no water, no food, no chewing gum, no mints, no candies, no chewing tobacco.     Take these medicines the morning of your procedure with A SIP OF WATER:Prilosec   Please make arrangements for a responsible person to drive you home after the procedure. You cannot go home by cab/taxi. We recommend you have someone with you at home the first 24 hours after your procedure. Driver for procedure is friend Laymond Purser   LEAVE ALL VALUABLES, JEWELRY, BILLFOLD AT HOME.  NO DENTURES, CONTACT LENSES ALLOWED IN THE ENDOSCOPY ROOM.   YOU MAY WEAR DEODORANT, PLEASE REMOVE ALL JEWELRY, WATCHES RINGS, BODY PIERCINGS AND LEAVE AT HOME.   WOMEN: NO MAKE-UP, LOTIONS PERFUMES

## 2012-01-17 ENCOUNTER — Other Ambulatory Visit: Payer: Self-pay | Admitting: *Deleted

## 2012-01-17 DIAGNOSIS — I83893 Varicose veins of bilateral lower extremities with other complications: Secondary | ICD-10-CM

## 2012-01-31 ENCOUNTER — Encounter: Payer: Self-pay | Admitting: Vascular Surgery

## 2012-02-01 ENCOUNTER — Ambulatory Visit (INDEPENDENT_AMBULATORY_CARE_PROVIDER_SITE_OTHER): Payer: No Typology Code available for payment source | Admitting: Vascular Surgery

## 2012-02-01 ENCOUNTER — Encounter: Payer: Self-pay | Admitting: Vascular Surgery

## 2012-02-01 VITALS — BP 117/77 | HR 98 | Resp 18 | Ht <= 58 in | Wt 188.0 lb

## 2012-02-01 DIAGNOSIS — I83893 Varicose veins of bilateral lower extremities with other complications: Secondary | ICD-10-CM | POA: Insufficient documentation

## 2012-02-01 HISTORY — PX: ENDOVENOUS ABLATION SAPHENOUS VEIN W/ LASER: SUR449

## 2012-02-01 NOTE — Progress Notes (Signed)
Laser Ablation Procedure      Date: 02/01/2012    Dominique [name] DOB:1975/10/10  Consent signed: Yes  Surgeon:T.F. Anthonee Gelin  Procedure: Laser Ablation: right Greater Saphenous Vein  BP 117/77  Pulse 98  Resp 18  Ht 4\' 7"  (1.397 m)  Wt 188 lb (85.276 kg)  BMI 43.70 kg/m2  Start time: 9:00am   End time: 10:10 am  Tumescent Anesthesia: 475 cc 0.9% NaCl with 50 cc Lidocaine HCL with 1% Epi and 15 cc 8.4% NaHCO3  Local Anesthesia: 4 cc Lidocaine HCL and NaHCO3 (ratio 2:1)  Continuous Mode: 15 Watts Total Energy 1948 Joules Total Time2:09     Stab Phlebectomy: 10-20 Sites: Thigh and Calf  Right   Patient tolerated procedure well: Yes  Notes: Non-latex (latex-free) gloves used and latex precautions taken during set-up and procedure due to [name]'s allergy to latex.   Description of Procedure:  After marking the course of the saphenous vein and the secondary varicosities in the standing position, the patient was placed on the operating table in the supine position, and the right leg was prepped and draped in sterile fashion. Local anesthetic was administered, and under ultrasound guidance the saphenous vein was accessed with a micro needle and guide wire; then the micro puncture sheath was placed. A guide wire was inserted to the saphenofemoral junction, followed by a 5 french sheath.  The position of the sheath and then the laser fiber below the junction was confirmed using the ultrasound and visualization of the aiming beam.  Tumescent anesthesia was administered along the course of the saphenous vein using ultrasound guidance. Protective laser glasses were placed on the patient, and the laser was fired at at 15 watt continuous mode.  For a total of 1948 joules.  A steri strip was applied to the puncture site.  The patient was then put into Trendelenburg position.  Local anesthetic was utilized overlying the marked varicosities.  Greater than 10-20 stab wounds were made using the  tip of an 11 blade; and using the vein hook,  The phlebectomies were performed using a hemostat to avulse these varicosities.  Adequate hemostasis was achieved, and steri strips were applied to the stab wound.      ABD pads and thigh high compression stockings were applied.  Ace wrap bandages were applied over the phlebectomy sites and at the top of the saphenofemoral junction.  Blood loss was less than 15 cc.  The patient ambulated out of the operating room having tolerated the procedure well.

## 2012-02-05 ENCOUNTER — Encounter: Payer: Self-pay | Admitting: Family Medicine

## 2012-02-05 ENCOUNTER — Ambulatory Visit (INDEPENDENT_AMBULATORY_CARE_PROVIDER_SITE_OTHER): Payer: No Typology Code available for payment source | Admitting: Family Medicine

## 2012-02-05 VITALS — BP 104/75 | HR 106 | Temp 99.3°F | Ht <= 58 in | Wt 184.0 lb

## 2012-02-05 DIAGNOSIS — R109 Unspecified abdominal pain: Secondary | ICD-10-CM

## 2012-02-05 DIAGNOSIS — I83893 Varicose veins of bilateral lower extremities with other complications: Secondary | ICD-10-CM

## 2012-02-05 DIAGNOSIS — E669 Obesity, unspecified: Secondary | ICD-10-CM | POA: Insufficient documentation

## 2012-02-05 DIAGNOSIS — D649 Anemia, unspecified: Secondary | ICD-10-CM

## 2012-02-05 DIAGNOSIS — M25569 Pain in unspecified knee: Secondary | ICD-10-CM

## 2012-02-05 MED ORDER — KETOROLAC TROMETHAMINE 30 MG/ML IJ SOLN
30.0000 mg | Freq: Once | INTRAMUSCULAR | Status: AC
  Administered 2012-02-05: 30 mg via INTRAMUSCULAR

## 2012-02-05 MED ORDER — IBUPROFEN 600 MG PO TABS: 600.0000 mg | ORAL_TABLET | Freq: Three times a day (TID) | ORAL | Status: AC | PRN

## 2012-02-05 NOTE — Assessment & Plan Note (Signed)
Discussed weight gain, advised increase activity as tolerated, and discussed dietary modifications.  Will check lipid panel and TSH.

## 2012-02-05 NOTE — Progress Notes (Signed)
  Subjective:    Patient ID: Dominique [name], female    DOB: November 26, 1975, 37 y.o.   MRN: [id number]  HPI: Dominique [name] come sin for follow up.  She is doing OK.  She had surgery for her varicose veins that caused her pain in December, and is still recovering from the surgery.  She is taking OTC ibuprofen, would like Rx strength for the pain.  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   Obesity- has gained about 10 lbs since visit with me a few months ago.  Attributes a lot of this to her inactivity after surgery, but also says she has been eating a bland diet due to her GERD/Esophagus issues.  This has included oatmeal and a lot of carbohydrates.   GI and Anemia- remains profoundly anemic, is taking iron once a day.  Does have some issues with constipation.  Is scheduled to have upper endoscopy and colonoscopy by Dr. Ronney Asters in March. Endorses some fatigue, feeling cold, but no light headedness or dizziness.   Past Medical History  Diagnosis Date  . Raynaud's disease   . DVT (deep venous thrombosis)   . Raynaud disease 12/21/2011  . Varicose veins     History  Substance Use Topics  . Smoking status: Never Smoker   . Smokeless tobacco: Never Used  . Alcohol Use: No  Patient is a recovered narcotic prescription drug abuser.   Family History  Problem Relation Age of Onset  . Diabetes Mother   . Heart disease Mother   . Hyperlipidemia Mother   . Hypertension Mother   . Other Mother     amputation, varicose veins  . Diabetes Father   . Hyperlipidemia Father   . Hypertension Father   . Peripheral vascular disease Father   . Other Father     varicose veins  . Deep vein thrombosis Brother   . Other Brother     varicose veins   ROS: Pertinent items in HPI    Objective:  Physical Exam: BP 104/75  Pulse 106  Temp 99.3 F (37.4 C) (Oral)  Ht 4\' 7"  (1.397 m)  Wt 184 lb (83.462 kg)  BMI 42.77 kg/m2 General  appearance: alert, cooperative and no distress Neck: No thyromegaly Head: Normocephalic, without obvious abnormality, atraumatic Lungs: clear to auscultation bilaterally Heart: regular rate and rhythm, S1, S2 normal, no murmur, click, rub or gallop Pulses: 2+ and symmetric\ Extremities: No pitting edema, multiple areas with steri strips and bruising on right thigh.        Assessment & Plan:

## 2012-02-05 NOTE — Assessment & Plan Note (Signed)
Reviewed labs- patient has Hemoglobin 8.9 most recently, low total iron, low %saturation, low ferritin, and normal (poorly responsive) reticulocyte count. I am concerned she could have a slow GI bleed.  She is scheduled for endoscopy/colonoscopy.  I did discuss with her it may help to increase PO iron, and even discussed foods with iron, cooking with cast iron, etc.  She will increase iron to BID, but not more due to constipation issues.

## 2012-02-05 NOTE — Assessment & Plan Note (Signed)
Rx for ibuprofen, did discuss to always take with food considering GI concerns right now.  No other options per patient due to hx of rx drug abuse. Also advised to wear compression stockings during works shifts.

## 2012-02-05 NOTE — Patient Instructions (Signed)
It was good to see you.  Please make an appointment for labs- please do not eat or drink anything but water or black coffee before the labs are drawn. I will send you a letter with your lab results, or call you if anything is abnormal.    Please increase you iron to twice a day.   To help you work on improving your nutrition, remember the plate rule for each meal:  - 1/4 of the plate or less should be a whole grain starch (brown rice, whole grain pasta, wheat bread, etc.)  - 1/4 of the plate should be a lean source of protein (chicken, [place], fish, beans, egg whites).  - 1/2 the plate or more should be fruits and vegetables - the more vegetables the better!    Also, please try to slowly increase your activity as pain tolerates.

## 2012-02-05 NOTE — Assessment & Plan Note (Signed)
This may be GERD/Esophagitis, but considering anemia I am concerned about an ulcer- will f/u endoscopy. Continue PPI.

## 2012-02-06 ENCOUNTER — Telehealth: Payer: Self-pay | Admitting: *Deleted

## 2012-02-06 NOTE — Telephone Encounter (Signed)
02/06/2012  Time: 8:45 AM   Patient Name: Dominique [name]  Patient of: T.F. Early  Procedure:Laser Ablation right greater saphenous vein and stab phlebectomy 10-20 incisions right leg -02-01-2012  Reached patient at home and checked  Her status  Yes    Comments/Actions Taken:Ms. Chenoweth states she is having pain along her right inner thigh and that the top of her right inner thigh is swollen.  She states she has not taken Ibuprofen since last night.  Advised her to take Ibuprofen 600 mg three times daily with meals and to keep right leg elevated, to make sure compression dressing is pulled up and covering the top of her right thigh up to her groin, and to use ice compress to right thigh as needed for pain. Encouraged her to call [facility] for questions, concerns, or if symptoms worsen.  Reminded her of follow up appointment with Dr. Arbie Cookey and post laser ablation duplex on 02-08-2012.  02-02-2012 Spoke with Laymond Purser [phone] ( Ms. Mestre's counseling supervisor) per Ms. Hooton's request to explain that  Ms. Pharr needs to take Ibuprofen 600 mg three times daily with meals for one week (until she sees Dr. Arbie Cookey on 02-08-2012).      @SIGNATURE @

## 2012-02-06 NOTE — Telephone Encounter (Signed)
Ms. Dominique [name] states that she is continuing to have moderate pain right inner thigh (s/p endovenous laser ablation right greater saphenous vein 02-01-2012) despite taking Ibuprofen 600 mg three times daily with food.  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  Encouraged her to elevate right leg, use Ibuprofen 600 mg TID with meals, wear compression hose daytime, and use ice pack as needed for pain to right inner thigh.  Reassured her that bruising and pain in the area that was treated is within normal limits.  Encouraged her to call [facility] if symptoms intensify or if she has questions or concerns.  Reminded her of post ablation duplex and FU with Dr. Arbie Cookey on 02-08-2012.

## 2012-02-07 ENCOUNTER — Encounter: Payer: Self-pay | Admitting: Vascular Surgery

## 2012-02-08 ENCOUNTER — Encounter (INDEPENDENT_AMBULATORY_CARE_PROVIDER_SITE_OTHER): Payer: No Typology Code available for payment source | Admitting: Vascular Surgery

## 2012-02-08 ENCOUNTER — Ambulatory Visit (INDEPENDENT_AMBULATORY_CARE_PROVIDER_SITE_OTHER): Payer: No Typology Code available for payment source | Admitting: Vascular Surgery

## 2012-02-08 ENCOUNTER — Encounter: Payer: Self-pay | Admitting: Vascular Surgery

## 2012-02-08 ENCOUNTER — Other Ambulatory Visit: Payer: Self-pay | Admitting: *Deleted

## 2012-02-08 VITALS — BP 112/74 | HR 84 | Resp 18 | Ht <= 58 in | Wt 184.0 lb

## 2012-02-08 DIAGNOSIS — I83893 Varicose veins of bilateral lower extremities with other complications: Secondary | ICD-10-CM

## 2012-02-08 NOTE — Progress Notes (Signed)
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 She does have slightly more than the usual amount of bruising in her thigh. She is minimal bruising at the phlebectomy sites.  Venous duplex of the right leg today shows closure of her great saphenous vein with no evidence of DVT.  Does have symptoms related to her left leg as well as prolonged standing which he does at work. She underwent formal left leg duplex today and this does show incompetence and dilatation of her great saphenous vein on the left as well extending in a tributary varicosities.  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 She will continue her compression for additional week. She does wish to have treatment of her left leg and we will proceed with this with laser ablation of her left great saphenous vein and stab phlebectomy at her convenience

## 2012-02-15 ENCOUNTER — Other Ambulatory Visit: Payer: No Typology Code available for payment source

## 2012-02-16 ENCOUNTER — Other Ambulatory Visit: Payer: No Typology Code available for payment source

## 2012-02-16 DIAGNOSIS — E669 Obesity, unspecified: Secondary | ICD-10-CM

## 2012-02-16 LAB — LIPID PANEL
Cholesterol: 192 mg/dL (ref 0–200)
LDL Cholesterol: 115 mg/dL — ABNORMAL HIGH (ref 0–99)
Total CHOL/HDL Ratio: 4 Ratio
VLDL: 29 mg/dL (ref 0–40)

## 2012-02-16 NOTE — Progress Notes (Signed)
FLP AND TSH DONE TODAY Dominique [name] 

## 2012-02-21 ENCOUNTER — Encounter: Payer: Self-pay | Admitting: Vascular Surgery

## 2012-02-22 ENCOUNTER — Other Ambulatory Visit: Payer: No Typology Code available for payment source | Admitting: Vascular Surgery

## 2012-02-28 ENCOUNTER — Encounter: Payer: Self-pay | Admitting: Vascular Surgery

## 2012-02-29 ENCOUNTER — Ambulatory Visit (INDEPENDENT_AMBULATORY_CARE_PROVIDER_SITE_OTHER): Payer: No Typology Code available for payment source | Admitting: Vascular Surgery

## 2012-02-29 ENCOUNTER — Ambulatory Visit: Payer: No Typology Code available for payment source | Admitting: Vascular Surgery

## 2012-02-29 ENCOUNTER — Encounter: Payer: Self-pay | Admitting: Vascular Surgery

## 2012-02-29 ENCOUNTER — Encounter (HOSPITAL_COMMUNITY): Payer: Self-pay | Admitting: *Deleted

## 2012-02-29 VITALS — BP 106/74 | HR 67 | Resp 18 | Ht <= 58 in | Wt 184.0 lb

## 2012-02-29 DIAGNOSIS — I83893 Varicose veins of bilateral lower extremities with other complications: Secondary | ICD-10-CM

## 2012-02-29 HISTORY — PX: ENDOVENOUS ABLATION SAPHENOUS VEIN W/ LASER: SUR449

## 2012-02-29 NOTE — Pre-Procedure Instructions (Signed)
Your procedure is scheduled WU:[id number], March 26, 2012 Report to [facility] Admitting NW:2956 Call this number if you have problems morning of your [phone]  Follow all bowel prep instructions per your doctor's orders.  Do not eat or drink anything after midnight the night before your procedure. You may brush your teeth, rinse out your mouth, but no water, no food, no chewing gum, no mints, no candies, no chewing tobacco.     Take these medicines the morning of your procedure with A SIP OF WATER:Prilosec   Please make arrangements for a responsible person to drive you home after the procedure. You cannot go home by cab/taxi. We recommend you have someone with you at home the first 24 hours after your procedure. Driver for procedure is friend Laymond Purser [phone]  LEAVE ALL VALUABLES, JEWELRY, BILLFOLD AT HOME.  NO DENTURES, CONTACT LENSES ALLOWED IN THE ENDOSCOPY ROOM.   YOU MAY WEAR DEODORANT, PLEASE REMOVE ALL JEWELRY, WATCHES RINGS, BODY PIERCINGS AND LEAVE AT HOME.   WOMEN: NO MAKE-UP, LOTIONS PERFUMES

## 2012-02-29 NOTE — Progress Notes (Signed)
Laser Ablation Procedure      Date: 02/29/2012    Dominique [name] DOB:March 20, 1975  Consent signed: Yes  Surgeon:T.F. Hargis Vandyne  Procedure: Laser Ablation: left Greater Saphenous Vein  BP 106/74  Pulse 67  Resp 18  Ht 4\' 7"  (1.397 m)  Wt 184 lb (83.462 kg)  BMI 42.77 kg/m2  Start time: 10:50am   End time: 12:15pm  Tumescent Anesthesia: 450 cc 0.9% NaCl with 50 cc Lidocaine HCL with 1% Epi and 15 cc 8.4% NaHCO3  Local Anesthesia: 3 cc Lidocaine HCL and NaHCO3 (ratio 2:1)  Continuous Mode: 15 Watts Total Energy 2216 Joules Total Time2:27     Stab Phlebectomy: 10-20 incisions Sites: Thigh and Calf  Left leg  Patient tolerated procedure well: Yes  Notes: Latex-free precautions taken due to Ms. Vancleve's allergy to latex.  Latex free gloves used for procedure.  Description of Procedure:  After marking the course of the saphenous vein and the secondary varicosities in the standing position, the patient was placed on the operating table in the supine position, and the left leg was prepped and draped in sterile fashion. Local anesthetic was administered, and under ultrasound guidance the saphenous vein was accessed with a micro needle and guide wire; then the micro puncture sheath was placed. A guide wire was inserted to the saphenofemoral junction, followed by a 5 french sheath.  The position of the sheath and then the laser fiber below the junction was confirmed using the ultrasound and visualization of the aiming beam.  Tumescent anesthesia was administered along the course of the saphenous vein using ultrasound guidance. Protective laser glasses were placed on the patient, and the laser was fired at at 15 watt continuous mode.  For a total of 2216 joules.  A steri strip was applied to the puncture site.  The patient was then put into Trendelenburg position.  Local anesthetic was utilized overlying the marked varicosities.  Greater than 10-20 stab wounds were made using the tip of an 11  blade; and using the vein hook,  The phlebectomies were performed using a hemostat to avulse these varicosities.  Adequate hemostasis was achieved, and steri strips were applied to the stab wound.      ABD pads and thigh high compression stockings were applied.  Ace wrap bandages were applied over the phlebectomy sites and at the top of the saphenofemoral junction.  Blood loss was less than 15 cc.  The patient ambulated out of the operating room having tolerated the procedure well.

## 2012-03-01 ENCOUNTER — Encounter: Payer: Self-pay | Admitting: *Deleted

## 2012-03-04 ENCOUNTER — Encounter: Payer: Self-pay | Admitting: Family Medicine

## 2012-03-04 ENCOUNTER — Encounter: Payer: Self-pay | Admitting: Vascular Surgery

## 2012-03-04 ENCOUNTER — Telehealth: Payer: Self-pay | Admitting: Family Medicine

## 2012-03-04 ENCOUNTER — Ambulatory Visit (INDEPENDENT_AMBULATORY_CARE_PROVIDER_SITE_OTHER): Payer: No Typology Code available for payment source | Admitting: Family Medicine

## 2012-03-04 ENCOUNTER — Telehealth: Payer: Self-pay | Admitting: *Deleted

## 2012-03-04 VITALS — BP 122/81 | HR 78 | Temp 98.8°F | Ht <= 58 in | Wt 186.3 lb

## 2012-03-04 MED ORDER — DOXYCYCLINE HYCLATE 100 MG PO TABS: 100.0000 mg | ORAL_TABLET | Freq: Two times a day (BID) | ORAL | Status: DC

## 2012-03-04 MED ORDER — CETIRIZINE HCL 10 MG PO TABS: 10.0000 mg | ORAL_TABLET | Freq: Every day | ORAL | Status: DC

## 2012-03-04 NOTE — Telephone Encounter (Signed)
Patient states she has the orange card. I called RX  For Doxycycline to the [facility] pharmacy . They do have this and will cost patient $6.00. I called [facility] and cancelled doxycycline RX.

## 2012-03-04 NOTE — Assessment & Plan Note (Signed)
Although symptoms have not been going on for 7-10 days, will treat with Doxycycline and Zyrtec since symptoms are unchanged and because she cannot use Flonase.  Patient says prednisone has helped in the past, but I will hold off on this for now.  Red flags reviewed for worsening symptoms.  Return to clinic as needed.

## 2012-03-04 NOTE — Telephone Encounter (Signed)
03/04/2012  Time: 3:22 PM   Patient Name: Dominique [name]  Patient of: T.F. Early  Procedure:Laser Ablation left greater saphenous vein and stab phlebectomy 10-20 incisions left leg 02-29-2012  Reached patient at home and checked  Her status  Yes    Comments/Actions Taken: [name] states no problem with bleeding or swelling.  States she has mild pain left inner thigh which is relieved with Ibuprofen.  She states the pain in her left inner thigh is much less than the pain she experienced in her right inner thigh. (She underwent endovenous laser ablation right greater saphenous vein 02-01-2012.)  Reviewed post laser ablations instructions with her and reminded her of post laser ablation duplex and follow up appointment with Dr. Hart Rochester on 03-05-2012. ( Dr. Arbie Cookey will be out-of-town. )      @SIGNATURE @

## 2012-03-04 NOTE — Progress Notes (Signed)
  Subjective:    Patient ID: Dominique [name], female    DOB: 01/15/1975, 37 y.o.   MRN: [id number]  HPI  Same day appointment for URI x 5 days. Symptoms: fatigue, rhinorrhea, nasal congestion, subjective fevers at home. Symptoms started 5 days ago.  Symptoms are unchanged. Allegra D did not help at all.  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 Denies cough, sore throat.  Denies nausea/vomiting. Eating and drinking well.  Review of Systems Per HPI    Objective:   Physical Exam  Constitutional: She appears well-nourished. No distress.  Sounds congested  HENT:  Head: Atraumatic.  Nose: Rhinorrhea present.  Mouth/Throat: Oropharynx is clear and moist. No oropharyngeal exudate.  Neck: Neck supple.  Pulmonary/Chest: Effort normal and breath sounds normal. She has no wheezes. She has no rales.  Abdominal: Soft. She exhibits no distension. There is no tenderness.  Lymphadenopathy:    She has cervical adenopathy.      Assessment & Plan:

## 2012-03-04 NOTE — Addendum Note (Signed)
Addended by: Tye Savoy, Beonka Amesquita on: 03/04/2012 12:23 PM   Modules accepted: Orders

## 2012-03-04 NOTE — Patient Instructions (Addendum)
Pick up Doxycycline (antibiotic) and Zyrtec at [facility]. If symptoms do not improve in 1-2 weeks, please return to clinic. Hope you feel better soon.  Sinusitis Sinusitis is redness, soreness, and swelling (inflammation) of the paranasal sinuses. Paranasal sinuses are air pockets within the bones of your face (beneath the eyes, the middle of the forehead, or above the eyes). In healthy paranasal sinuses, mucus is able to drain out, and air is able to circulate through them by way of your nose. However, when your paranasal sinuses are inflamed, mucus and air can become trapped. This can allow bacteria and other germs to grow and cause infection. Sinusitis can develop quickly and last only a short time (acute) or continue over a long period (chronic). Sinusitis that lasts for more than 12 weeks is considered chronic.  CAUSES  Causes of sinusitis include:  Allergies.  Structural abnormalities, such as displacement of the cartilage that separates your nostrils (deviated septum), which can decrease the air flow through your nose and sinuses and affect sinus drainage.  Functional abnormalities, such as when the small hairs (cilia) that line your sinuses and help remove mucus do not work properly or are not present. SYMPTOMS  Symptoms of acute and chronic sinusitis are the same. The primary symptoms are pain and pressure around the affected sinuses. Other symptoms include:  Upper toothache.  Earache.  Headache.  Bad breath.  Decreased sense of smell and taste.  A cough, which worsens when you are lying flat.  Fatigue.  Fever.  Thick drainage from your nose, which often is green and may contain pus (purulent).  Swelling and warmth over the affected sinuses. DIAGNOSIS  Your caregiver will perform a physical exam. During the exam, your caregiver may:  Look in your nose for signs of abnormal growths in your nostrils (nasal polyps).  Tap over the affected sinus to check for  signs of infection.  View the inside of your sinuses (endoscopy) with a special imaging device with a light attached (endoscope), which is inserted into your sinuses. If your caregiver suspects that you have chronic sinusitis, one or more of the following tests may be recommended:  Allergy tests.  Nasal culture A sample of mucus is taken from your nose and sent to a lab and screened for bacteria.  Nasal cytology A sample of mucus is taken from your nose and examined by your caregiver to determine if your sinusitis is related to an allergy. TREATMENT  Most cases of acute sinusitis are related to a viral infection and will resolve on their own within 10 days. Sometimes medicines are prescribed to help relieve symptoms (pain medicine, decongestants, nasal steroid sprays, or saline sprays).  However, for sinusitis related to a bacterial infection, your caregiver will prescribe antibiotic medicines. These are medicines that will help kill the bacteria causing the infection.  Rarely, sinusitis is caused by a fungal infection. In theses cases, your caregiver will prescribe antifungal medicine. For some cases of chronic sinusitis, surgery is needed. Generally, these are cases in which sinusitis recurs more than 3 times per year, despite other treatments. HOME CARE INSTRUCTIONS   Drink plenty of water. Water helps thin the mucus so your sinuses can drain more easily.  Use a humidifier.  Inhale steam 3 to 4 times a day (for example, sit in the bathroom with the shower running).  Apply a warm, moist washcloth to your face 3 to 4 times a day, or as directed by your caregiver.  Use saline nasal sprays  to help moisten and clean your sinuses.  Take over-the-counter or prescription medicines for pain, discomfort, or fever only as directed by your caregiver. SEEK IMMEDIATE MEDICAL CARE IF:  You have increasing pain or severe headaches.  You have nausea, vomiting, or drowsiness.  You have swelling  around your face.  You have vision problems.  You have a stiff neck.  You have difficulty breathing. MAKE SURE YOU:   Understand these instructions.  Will watch your condition.  Will get help right away if you are not doing well or get worse. Document Released: 12/26/2004 Document Revised: 03/20/2011 Document Reviewed: 01/10/2011 [employer] Patient Information 2013 [place], [place].

## 2012-03-04 NOTE — Telephone Encounter (Signed)
meds sent to [facility] was too expensive and wants to know if she can come by and pick up script to take somewhere else or call to [facility]

## 2012-03-05 ENCOUNTER — Ambulatory Visit (INDEPENDENT_AMBULATORY_CARE_PROVIDER_SITE_OTHER): Payer: No Typology Code available for payment source | Admitting: Vascular Surgery

## 2012-03-05 ENCOUNTER — Encounter (INDEPENDENT_AMBULATORY_CARE_PROVIDER_SITE_OTHER): Payer: No Typology Code available for payment source | Admitting: *Deleted

## 2012-03-05 ENCOUNTER — Encounter: Payer: Self-pay | Admitting: Vascular Surgery

## 2012-03-05 VITALS — BP 113/76 | HR 76 | Ht <= 58 in | Wt 186.8 lb

## 2012-03-05 DIAGNOSIS — Z48812 Encounter for surgical aftercare following surgery on the circulatory system: Secondary | ICD-10-CM

## 2012-03-05 DIAGNOSIS — I83893 Varicose veins of bilateral lower extremities with other complications: Secondary | ICD-10-CM

## 2012-03-05 NOTE — Progress Notes (Signed)
Subjective:     Patient ID: Dominique [name], female   DOB: 1975-04-03, 37 y.o.   MRN: [id number]  HPI this 37 year old female returns 5 days post laser ablation left great saphenous vein for venous hypertension with pain and swelling. She had a procedure done on the right leg about 3 weeks earlier and has done well from both sides. She has had some mild discomfort in the left medial knee area where the ablation was performed and minimal discomfort in the proximal left thigh. She has had no distal edema. She has worn elastic compression stocking and taking ibuprofen as instructed.  Past Medical History  Diagnosis Date  . Raynaud's disease   . DVT (deep venous thrombosis)   . Raynaud disease 12/21/2011  . Varicose veins     History  Substance Use Topics  . Smoking status: Never Smoker   . Smokeless tobacco: Never Used  . Alcohol Use: No    Family History  Problem Relation Age of Onset  . Diabetes Mother   . Heart disease Mother   . Hyperlipidemia Mother   . Hypertension Mother   . Other Mother     amputation, varicose veins  . Diabetes Father   . Hyperlipidemia Father   . Hypertension Father   . Peripheral vascular disease Father   . Other Father     varicose veins  . Deep vein thrombosis Brother   . Other Brother     varicose veins    Allergies  Allergen Reactions  . Contrast Media (Iodinated Diagnostic Agents) Anaphylaxis  . Latex Anaphylaxis  . Zofran (Ondansetron Hcl) Other (See Comments)    Hives, numbness of legs with previous use  . Eggs Or Egg-Derived Products Nausea And Vomiting  . Lactose Intolerance (Gi) Diarrhea    Current outpatient prescriptions:acetaminophen (TYLENOL) 500 MG tablet, Take 1,000 mg by mouth every 6 (six) hours as needed. For pain., Disp: , Rfl: ;  cetirizine (ZYRTEC) 10 MG tablet, Take 1 tablet (10 mg total) by mouth daily., Disp: 30 tablet, Rfl: 11;  doxycycline (VIBRA-TABS) 100 MG tablet, Take 1 tablet (100 mg total) by mouth 2 (two)  times daily., Disp: 14 tablet, Rfl: 0 ferrous sulfate 325 (65 FE) MG EC tablet, Take 325 mg by mouth daily with breakfast., Disp: , Rfl: ;  Multiple Vitamin (MULTIVITAMIN WITH MINERALS) TABS, Take 1 tablet by mouth daily., Disp: , Rfl: ;  omeprazole (PRILOSEC) 20 MG capsule, Take 20 mg by mouth daily., Disp: , Rfl:   BP 113/76  Pulse 76  Ht 4\' 7"  (1.397 m)  Wt 186 lb 12.8 oz (84.732 kg)  BMI 43.42 kg/m2  SpO2 100%  Body mass index is 43.42 kg/(m^2).           Review of SystemsDenies chest pain, dyspnea on exertion, PND, orthopnea, hemoptysis, claudication     Objective:   Physical Exam general well-developed well-nourished female in no apparent stress alert and oriented x3 Lungs no rhonchi or wheezing Left leg with mild discomfort along the course of great saphenous vein particularly in distal thigh and knee area. Minimal ecchymosis noted. 3+ dorsalis pedis pulse palpable. Minimal edema distally.  Today I ordered a venous duplex exam of the left leg which are reviewed and interpreted. There is no DVT. Left great saphenous vein is closed up to a 0.1 cm from the saphenofemoral junction      Assessment:     Successful bilateral laser ablation great saphenous veins for venous hypertension-doing well  Plan:     Return on when necessary basis

## 2012-03-08 ENCOUNTER — Encounter: Payer: Self-pay | Admitting: Neurosurgery

## 2012-03-08 ENCOUNTER — Ambulatory Visit (INDEPENDENT_AMBULATORY_CARE_PROVIDER_SITE_OTHER): Payer: No Typology Code available for payment source | Admitting: Neurosurgery

## 2012-03-08 ENCOUNTER — Telehealth: Payer: Self-pay | Admitting: *Deleted

## 2012-03-08 VITALS — BP 110/81 | HR 95 | Resp 18 | Ht <= 58 in | Wt 186.0 lb

## 2012-03-08 DIAGNOSIS — I83893 Varicose veins of bilateral lower extremities with other complications: Secondary | ICD-10-CM

## 2012-03-08 NOTE — Telephone Encounter (Signed)
Patient called c/o extreme pain behind left knee and feels a hard area at this site. She is s/p left GSV ablation. Patient has been wearing her stockings. Area is not bleeding at this time, however during her post op ultrasound a steri strip was removed and patient says she had bleeding at that time.  I scheduled her to come in to see Lauree Chandler NP 03/08/12.

## 2012-03-08 NOTE — Progress Notes (Signed)
Subjective:     Patient ID: Dominique [name], female   DOB: 15-Mar-1975, 37 y.o.   MRN: [id number]  HPI: 37 year old female patient that underwent ablation of left greater saphenous vein and was seen in followup for  post ablation venous duplex [id number]. The patient called the office yesterday asking to be seen due to some pain where a Steri-Strip have been removed. The patient was brought in for evaluation. The patient states that swelling at the stab sites is not as bad today as it was yesterday but she wanted to come on in since she had the appointment..   Review of Systems: 12 point review of systems is notable for the difficulties described above otherwise unremarkable     Objective:   Physical Exam: Redness or drainage.     Assessment:     One-week status post successful ablation of the left great saphenous vein. Marisue Ivan reassure the patient on the phone yesterday the the removal of the Steri-Strip may have caused a look small amount of bleeding that could cause increased soreness but otherwise the procedure went as planned and was successful.    Plan:     The patient will followup on as-needed basis, her questions were encouraged and answered.  Lauree Chandler ANP  Clinic M.D.: Hart Rochester on call

## 2012-03-12 ENCOUNTER — Encounter: Payer: Self-pay | Admitting: Vascular Surgery

## 2012-03-26 ENCOUNTER — Encounter (HOSPITAL_COMMUNITY)
Admission: RE | Disposition: A | Discharge status: Home / Self Care | Payer: Self-pay | Source: Ambulatory Visit | Attending: Gastroenterology

## 2012-03-26 ENCOUNTER — Telehealth: Payer: Self-pay | Admitting: Family Medicine

## 2012-03-26 ENCOUNTER — Ambulatory Visit (HOSPITAL_COMMUNITY): Payer: No Typology Code available for payment source | Admitting: Anesthesiology

## 2012-03-26 ENCOUNTER — Encounter (HOSPITAL_COMMUNITY): Payer: Self-pay | Admitting: *Deleted

## 2012-03-26 ENCOUNTER — Ambulatory Visit (HOSPITAL_COMMUNITY)
Admission: RE | Admit: 2012-03-26 | Discharge: 2012-03-26 | Disposition: A | Discharge status: Home / Self Care | Payer: No Typology Code available for payment source | Source: Ambulatory Visit | Attending: Gastroenterology | Admitting: Gastroenterology

## 2012-03-26 ENCOUNTER — Encounter (HOSPITAL_COMMUNITY): Payer: Self-pay | Admitting: Anesthesiology

## 2012-03-26 DIAGNOSIS — K219 Gastro-esophageal reflux disease without esophagitis: Secondary | ICD-10-CM | POA: Insufficient documentation

## 2012-03-26 DIAGNOSIS — Z86718 Personal history of other venous thrombosis and embolism: Secondary | ICD-10-CM | POA: Insufficient documentation

## 2012-03-26 DIAGNOSIS — R195 Other fecal abnormalities: Secondary | ICD-10-CM | POA: Insufficient documentation

## 2012-03-26 DIAGNOSIS — K319 Disease of stomach and duodenum, unspecified: Secondary | ICD-10-CM | POA: Insufficient documentation

## 2012-03-26 DIAGNOSIS — Z79899 Other long term (current) drug therapy: Secondary | ICD-10-CM | POA: Insufficient documentation

## 2012-03-26 DIAGNOSIS — D509 Iron deficiency anemia, unspecified: Secondary | ICD-10-CM | POA: Insufficient documentation

## 2012-03-26 DIAGNOSIS — K2960 Other gastritis without bleeding: Secondary | ICD-10-CM | POA: Insufficient documentation

## 2012-03-26 HISTORY — PX: ESOPHAGOGASTRODUODENOSCOPY (EGD) WITH PROPOFOL: SHX5813

## 2012-03-26 HISTORY — DX: Gastro-esophageal reflux disease without esophagitis: K21.9

## 2012-03-26 HISTORY — PX: COLONOSCOPY WITH PROPOFOL: SHX5780

## 2012-03-26 SURGERY — COLONOSCOPY WITH ESOPHAGOGASTRODUODENOSCOPY (EGD)
Anesthesia: Monitor Anesthesia Care

## 2012-03-26 SURGERY — ESOPHAGOGASTRODUODENOSCOPY (EGD) WITH PROPOFOL
Anesthesia: Monitor Anesthesia Care

## 2012-03-26 MED ORDER — MIDAZOLAM HCL 5 MG/5ML IJ SOLN
INTRAMUSCULAR | Status: DC | PRN
  Administered 2012-03-26: 2 mg via INTRAVENOUS

## 2012-03-26 MED ORDER — KETAMINE HCL 10 MG/ML IJ SOLN
INTRAMUSCULAR | Status: DC | PRN
  Administered 2012-03-26: 10 mg via INTRAVENOUS
  Administered 2012-03-26: 20 mg via INTRAVENOUS
  Administered 2012-03-26: 10 mg via INTRAVENOUS
  Administered 2012-03-26: 20 mg via INTRAVENOUS

## 2012-03-26 MED ORDER — FENTANYL CITRATE 0.05 MG/ML IJ SOLN
INTRAMUSCULAR | Status: DC | PRN
  Administered 2012-03-26: 50 ug via INTRAVENOUS

## 2012-03-26 MED ORDER — SODIUM CHLORIDE 0.9 % IV SOLN: INTRAVENOUS | Status: DC

## 2012-03-26 MED ORDER — LACTATED RINGERS IV SOLN
INTRAVENOUS | Status: DC
  Administered 2012-03-26: 1000 mL via INTRAVENOUS

## 2012-03-26 MED ORDER — PROPOFOL INFUSION 10 MG/ML OPTIME
INTRAVENOUS | Status: DC | PRN
  Administered 2012-03-26: 120 ug/kg/min via INTRAVENOUS

## 2012-03-26 SURGICAL SUPPLY — 24 items

## 2012-03-26 NOTE — Transfer of Care (Signed)
Immediate Anesthesia Transfer of Care Note  Patient: Dominique [name]  Procedure(s) Performed: Procedure(s): ESOPHAGOGASTRODUODENOSCOPY (EGD) WITH PROPOFOL (N/A) COLONOSCOPY WITH PROPOFOL (N/A)  Patient Location: PACU and Endoscopy Unit  Anesthesia Type:MAC  Level of Consciousness: awake and alert   Airway & Oxygen Therapy: Patient Spontanous Breathing and Patient connected to nasal cannula oxygen  Post-op Assessment: Report given to PACU RN and Post -op Vital signs reviewed and stable  Post vital signs: Reviewed and stable  Complications: No apparent anesthesia complications

## 2012-03-26 NOTE — H&P (Signed)
  Pleasant 37 year old female presents to the [facility] long endoscopy unit for evaluation of heme positive stool and anemia. She was seen at the family practice center last fall and noted to have a hemoglobin of 9.4 and a low ferritin level of 5. A barium enema was attempted but was unsuccessful. When I checked her in the office about 3 months ago, the stool was Hemoccult positive but she had recently been using ibuprofen.  Past medical history:  Allergies: Anaphylaxis to contrast it and latex. Also, allergy to Zofran.  Outpatient medications: Ibuprofen, Protonix, iron supplement  Operations: Hysterectomy  Past medical illnesses: Ring nodes syndrome, dysphagia status post esophageal dilatation about a year and a half ago, complicated by post procedurally. History of addiction.  Physical exam: An overweight but pleasant Caucasian female, in no acute distress. Anicteric, no frank pallor. Chest clear. Heart normal. Abdomen obese but without mass or tenderness.  Impression: Heme positive stool and anemia  Plan: Upper endoscopy and colonoscopy. Risks have been reviewed with the patient and she is agreeable.  Florencia Reasons, M.D. [phone]

## 2012-03-26 NOTE — Anesthesia Postprocedure Evaluation (Signed)
Anesthesia Post Note  Patient: Dominique [name]  Procedure(s) Performed: Procedure(s) (LRB): ESOPHAGOGASTRODUODENOSCOPY (EGD) WITH PROPOFOL (N/A) COLONOSCOPY WITH PROPOFOL (N/A)  Anesthesia type: General  Patient location: PACU  Post pain: Pain level controlled  Post assessment: Post-op Vital signs reviewed  Last Vitals:  Filed Vitals:   03/26/12 1030  BP: 116/75  Pulse:   Temp:   Resp: 15    Post vital signs: Reviewed  Level of consciousness: sedated  Complications: No apparent anesthesia complications

## 2012-03-26 NOTE — Op Note (Signed)
[facility] [address] [place], [address]   ENDOSCOPY PROCEDURE REPORT  PATIENT: Dominique [name], Dominique [name]  MR#: [id number] BIRTHDATE: May 02, 1975 , 36  yrs. old GENDER: Female ENDOSCOPIST:Dymond Gutt, MD REFERRED BY:  Dr. Glenice Bow [facility] PROCEDURE DATE:  03/26/2012 PROCEDURE:      upper endoscopy with biopsies ASA CLASS: INDICATIONS:   heme positive stool and iron deficiency anemia MEDICATION:   propofol, MAC TOPICAL ANESTHETIC:  DESCRIPTION OF PROCEDURE:   the patient came as an outpatient to the [facility] long endoscopy unit and remains stable while sedated by the anesthesia Department.  The Pentax video endoscope was passed under direct vision. The esophagus was readily entered and was normal in its entirety, without evidence of reflux esophagitis, Barrett's esophagus, varices, infection, neoplasia, ring, stricture, or hiatal hernia.  It the stomach had antral gastritis consistent with exposure to nonsteroidal anti-inflammatory drugs. There were patches of erythema and some focal erosions or at least mucosal divots suggestive of recent NSAID gastropathy.no frank ulcers were observed, however. No polyps or masses were seen, and a retroflex view the cardia was normal. The proximal stomach was normal in appearance.  The pylorus, duodenal bulb, and second duodenum looked normal. Random duodenal mucosal biopsies were obtained to look for evidence of celiac disease as a possible cause for iron deficiency anemia.  The patient tolerated the procedure well.     COMPLICATIONS: None  ENDOSCOPIC IMPRESSION:  1. Antral gastritis, erythematous and slightly erosive. This could account for the patient's iron deficiency anemia  RECOMMENDATIONS:  1. Await pathology results 2. Review medication history with patient, with particular reference to PPI prophylaxis and nonsteroidal anti-inflammatory drug  usage    _______________________________ eSignedBernette Redbird, MD 03/26/2012 10:33 AM    PATIENT NAME:  [name], [name] MR#: [id number]

## 2012-03-26 NOTE — Telephone Encounter (Signed)
Patient had her colonoscopy and endoscopy today.  The GI doctor wanted her to get with Dr. Lula Olszewski about her Hemoglobin.

## 2012-03-26 NOTE — Anesthesia Preprocedure Evaluation (Addendum)
Anesthesia Evaluation  Patient identified by MRN, date of birth, ID band Patient awake    Reviewed: Allergy & Precautions, H&P , NPO status , Patient's Chart, lab work & pertinent test results  Airway Mallampati: II TM Distance: >3 FB Neck ROM: Full    Dental  (+) Teeth Intact and Dental Advisory Given   Pulmonary neg pulmonary ROS,  breath sounds clear to auscultation  Pulmonary exam normal       Cardiovascular DVT - Peripheral Vascular Disease Rhythm:Regular Rate:Normal     Neuro/Psych Raynauds negative neurological ROS  negative psych ROS   GI/Hepatic Neg liver ROS, GERD-  Medicated,  Endo/Other  Morbid obesity  Renal/GU negative Renal ROS  negative genitourinary   Musculoskeletal negative musculoskeletal ROS (+)   Abdominal   Peds negative pediatric ROS (+)  Hematology negative hematology ROS (+)   Anesthesia Other Findings   Reproductive/Obstetrics negative OB ROS                          Anesthesia Physical Anesthesia Plan  ASA: II  Anesthesia Plan: MAC   Post-op Pain Management:    Induction: Intravenous  Airway Management Planned: Nasal Cannula  Additional Equipment:   Intra-op Plan:   Post-operative Plan:   Informed Consent: I have reviewed the patients History and Physical, chart, labs and discussed the procedure including the risks, benefits and alternatives for the proposed anesthesia with the patient or authorized representative who has indicated his/her understanding and acceptance.   Dental advisory given  Plan Discussed with: CRNA  Anesthesia Plan Comments:         Anesthesia Quick Evaluation

## 2012-03-26 NOTE — Telephone Encounter (Signed)
Please ask patient to make an office visit.

## 2012-03-26 NOTE — Op Note (Signed)
[facility] [address] [place], [address]   COLONOSCOPY PROCEDURE REPORT  PATIENT: Dominique [name], Dominique [name]  MR#: [id number] BIRTHDATE: 1975-12-12 , 36  yrs. old GENDER: Female ENDOSCOPIST: Bernette Redbird, MD REFERRED BY:   Dr. Burnett [facility] PROCEDURE DATE:  03/26/2012 PROCEDURE:     colonoscopy ASA CLASS: INDICATIONS:  heme positive stool and iron deficiency anemia MEDICATIONS:    propofol, per anesthesia  DESCRIPTION OF PROCEDURE: the patient came as an outpatient to the [facility] long endoscopy unit. This procedure was performed immediately following her upper endoscopy. She remained stable throughout both procedures.  The Pentax adult video colonoscope was readily advanced to the cecum as identified by visualization of the appendiceal orifice, and pullback was then performed. Brief attempts to enter the terminal ileum were unsuccessful, due to the patient's anatomy. There did not appear to be any stenosis or inflammation of the ileal cecal valve.  This was a normal examination.  No polyps, masses, colitis or other mucosal abnormalities, vascular ectasia, or diverticular disease were observed. A retroflex view of the rectum and reinspection of the rectum were unremarkable.  No biopsies were obtained.  The patient tolerated the procedure well.     COMPLICATIONS: None  ENDOSCOPIC IMPRESSION:  1. Normal examination 2. No source of iron deficiency anemia or heme positive stool evident on this exam. Her antral gastropathy might be responsible  RECOMMENDATIONS:  No followup colonoscopy is needed. Attention to upper GI tract as noted with respect to possible overuse of nonsteroidal anti-inflammatory drugs. Would recommend followup blood work and iron studies through her primary physician's office. evaluate for menstrual blood loss as needed.    _______________________________ eSignedBernette Redbird, MD 03/26/2012  10:39 AM

## 2012-03-27 ENCOUNTER — Encounter (HOSPITAL_COMMUNITY): Payer: Self-pay | Admitting: Gastroenterology

## 2012-03-27 NOTE — Telephone Encounter (Signed)
LM on identifiable VM that she will need to make an appt to discuss. Fleeger, Maryjo Rochester

## 2012-03-28 ENCOUNTER — Encounter: Payer: Self-pay | Admitting: Family Medicine

## 2012-03-28 ENCOUNTER — Ambulatory Visit (INDEPENDENT_AMBULATORY_CARE_PROVIDER_SITE_OTHER): Payer: No Typology Code available for payment source | Admitting: Family Medicine

## 2012-03-28 VITALS — BP 114/74 | HR 91 | Temp 99.6°F | Ht <= 58 in | Wt 189.5 lb

## 2012-03-28 DIAGNOSIS — D649 Anemia, unspecified: Secondary | ICD-10-CM

## 2012-03-28 LAB — CBC
HCT: 35.7 % — ABNORMAL LOW (ref 36.0–46.0)
Hemoglobin: 11.9 g/dL — ABNORMAL LOW (ref 12.0–15.0)
MCHC: 33.3 g/dL (ref 30.0–36.0)
RDW: 13.9 % (ref 11.5–15.5)
WBC: 7.1 10*3/uL (ref 4.0–10.5)

## 2012-03-28 LAB — FERRITIN: Ferritin: 18 ng/mL (ref 10–291)

## 2012-03-28 LAB — COMPREHENSIVE METABOLIC PANEL
ALT: 9 U/L (ref 0–35)
Albumin: 3.5 g/dL (ref 3.5–5.2)
Alkaline Phosphatase: 79 U/L (ref 39–117)
Glucose, Bld: 92 mg/dL (ref 70–99)
Potassium: 4 mEq/L (ref 3.5–5.3)
Sodium: 140 mEq/L (ref 135–145)
Total Bilirubin: 0.2 mg/dL — ABNORMAL LOW (ref 0.3–1.2)
Total Protein: 6.1 g/dL (ref 6.0–8.3)

## 2012-03-28 LAB — POCT HEMOGLOBIN: Hemoglobin: 11.3 g/dL — AB (ref 12.2–16.2)

## 2012-03-28 NOTE — Progress Notes (Signed)
  Subjective:    Patient ID: Dominique [name], female    DOB: [birth year], 37 y.o.   MRN: [id number]  HPI  1. Weakness. Going on for few months, but was worse today so made an acute visit. She has had anemia down to 8.3-9.7 in past 3 months, had an EGD/colonoscopy on 3/18 showing some gastritis otherwise no source of bleeding. She does not know of a reason to be weaker today, but states she ate some chicken nuggets, fries with extra salt before her appointment, and this made her feel better. 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  Had one BM today, no gross blood. S/p hysterectomy. She is taking iron 1-2 tabs daily, also s/p IV iron treatment in the hospital.   Review of Systems She endorses some slight left leg swelling which has persisted after a vein surgery recently. She thought she felt warm today, but no fever.  Denies syncope, orthostasis, chest pain, dyspnea, cough, weight loss, rash, abdominal pain.    Objective:   Physical Exam  Vitals reviewed. Constitutional: She is oriented to person, place, and time. She appears well-developed and well-nourished. No distress.  Appears well. Ambulates normally. No syncope with standing.  HENT:  Head: Normocephalic and atraumatic.  Mouth/Throat: Oropharynx is clear and moist. No oropharyngeal exudate.  Slight pale MM.  Eyes: EOM are normal. Pupils are equal, round, and reactive to light.  Neck: Neck supple.  Cardiovascular: Normal rate, regular rhythm and normal heart sounds.   No murmur heard. Pulmonary/Chest: Effort normal and breath sounds normal. No respiratory distress. She has no wheezes. She has no rales.  Abdominal: Soft. Bowel sounds are normal.  Musculoskeletal: She exhibits edema. She exhibits no tenderness.  Left LE trace pitting edema, no erythema or rash or tenderness.  Lymphadenopathy:    She has no cervical adenopathy.  Neurological: She is alert and oriented to person, place, and time.  Skin: No  rash noted. She is not diaphoretic.  Psychiatric: She has a normal mood and affect.       Assessment & Plan:

## 2012-03-28 NOTE — Assessment & Plan Note (Addendum)
Could be secondary to her anemia, for which is undergoing work up recently. Have low suspicion for glucose problems, electrolyte abnormalities. No systemic signs of infection or inflammatory issues currently. Recently completed EGD/colonoscopy. TSH recently wnl. Will check CBC, CMET for further evaluation. Recommend she drink plenty of fluids, continue iron, avoid excess salt, continue ppi in setting of gastritis which may be the cause of iron deficiency anemia. F/u with PCP in one week as already scheduled.

## 2012-03-28 NOTE — Patient Instructions (Addendum)
NIce to meet you. Will check some labwork today. Keep taking iron. Drink fluids.  Stay on prilosec daily. Make an appointment with Dr.Chamberlain if not already scheduled.   Anemia, Nonspecific Your exam and blood tests show you are anemic. This means your blood (hemoglobin) level is low. Normal hemoglobin values are 12 to 15 g/dL for females and 14 to 17 g/dL for males. Make a note of your hemoglobin level today. The hematocrit percent is also used to measure anemia. A normal hematocrit is 38% to 46% in females and 42% to 49% in males. Make a note of your hematocrit level today. CAUSES  Anemia can be due to many different causes.  Excessive bleeding from periods (in women).  Intestinal bleeding.  Poor nutrition.  Kidney, thyroid, liver, and bone marrow diseases. SYMPTOMS  Anemia can come on suddenly (acute). It can also come on slowly. Symptoms can include:  Minor weakness.  Dizziness.  Palpitations.  Shortness of breath. Symptoms may be absent until half your hemoglobin is missing if it comes on slowly. Anemia due to acute blood loss from an injury or internal bleeding may require blood transfusion if the loss is severe. Hospital care is needed if you are anemic and there is significant continual blood loss. TREATMENT   Stool tests for blood (Hemoccult) and additional lab tests are often needed. This determines the best treatment.  Further checking on your condition and your response to treatment is very important. It often takes many weeks to correct anemia. Depending on the cause, treatment can include:  Supplements of iron.  Vitamins B12 and folic acid.  Hormone medicines.If your anemia is due to bleeding, finding the cause of the blood loss is very important. This will help avoid further problems. SEEK IMMEDIATE MEDICAL CARE IF:   You develop fainting, extreme weakness, shortness of breath, or chest pain.  You develop heavy vaginal bleeding.  You develop bloody  or black, tarry stools or vomit up blood.  You develop a high fever, rash, repeated vomiting, or dehydration. Document Released: 02/03/2004 Document Revised: 03/20/2011 Document Reviewed: 11/10/2008 [facility] Patient Information 2013 [place], [place].

## 2012-03-29 ENCOUNTER — Telehealth: Payer: Self-pay | Admitting: *Deleted

## 2012-03-29 NOTE — Telephone Encounter (Signed)
Spoke with patient and informed her of below 

## 2012-03-29 NOTE — Telephone Encounter (Signed)
Message copied by Farrell Ours on Fri Mar 29, 2012  9:50 AM ------      Message from: Durwin Reges      Created: Fri Mar 29, 2012  8:17 AM       Please inform her labs were normal and hemoglobin is 11.7 now, much improved blood counts. Drink plenty of fluids. F/u with Dr. Lula Olszewski next week. ------

## 2012-04-02 ENCOUNTER — Ambulatory Visit (INDEPENDENT_AMBULATORY_CARE_PROVIDER_SITE_OTHER): Payer: No Typology Code available for payment source | Admitting: Family Medicine

## 2012-04-02 ENCOUNTER — Encounter: Payer: Self-pay | Admitting: Family Medicine

## 2012-04-02 VITALS — BP 120/81 | HR 81 | Temp 98.7°F | Ht <= 58 in | Wt 188.6 lb

## 2012-04-02 DIAGNOSIS — R531 Weakness: Secondary | ICD-10-CM

## 2012-04-02 DIAGNOSIS — K2990 Gastroduodenitis, unspecified, without bleeding: Secondary | ICD-10-CM

## 2012-04-02 DIAGNOSIS — K2970 Gastritis, unspecified, without bleeding: Secondary | ICD-10-CM

## 2012-04-02 DIAGNOSIS — D509 Iron deficiency anemia, unspecified: Secondary | ICD-10-CM

## 2012-04-02 DIAGNOSIS — R5381 Other malaise: Secondary | ICD-10-CM

## 2012-04-02 NOTE — Progress Notes (Signed)
  Subjective:    Patient ID: Dominique [name], female    DOB: 04/10/1975, 37 y.o.   MRN: [id number]  HPI:  Dominique [name] comes in for follow up.   Weakness spells- she says that over the past few weeks she has had several episodes that she feels extremely fatigued like she needs to lay down and rest.  This comes on all of a sudden and her co-workers have told her she looks pale.  She says once she went and got fast food with lots of salt and felt better. She denies any palpitations, dizziness, chest pain, dyspnea with these episodes.  She cannot think of anything that brings the episodes on but does admit that her family "is driving her crazy right now."   Anemia- CBC checked last visit Hg in December 8.9 -->11.9 last week, ferritin from <3 in December -->18 last week after starting iron supplementation.  EGD on 3/18 showed gastritis.  She has also re-started omeprazole.   Past Medical History  Diagnosis Date  . Raynaud's disease   . DVT (deep venous thrombosis)   . Raynaud disease 12/21/2011  . Varicose veins   . GERD (gastroesophageal reflux disease)     History  Substance Use Topics  . Smoking status: Never Smoker   . Smokeless tobacco: Never Used  . Alcohol Use: No    Family History  Problem Relation Age of Onset  . Diabetes Mother   . Heart disease Mother   . Hyperlipidemia Mother   . Hypertension Mother   . Other Mother     amputation, varicose veins  . Diabetes Father   . Hyperlipidemia Father   . Hypertension Father   . Peripheral vascular disease Father   . Other Father     varicose veins  . Deep vein thrombosis Brother   . Other Brother     varicose veins   ROS Pertinent items in HPI    Objective:  Physical Exam:  BP 120/81  Pulse 81  Temp(Src) 98.7 F (37.1 C) (Oral)  Ht 4\' 7"  (1.397 m)  Wt 188 lb 9.6 oz (85.548 kg)  BMI 43.83 kg/m2 General appearance: alert, cooperative and no distress Head: Normocephalic, without obvious abnormality,  atraumatic Lungs: clear to auscultation bilaterally Heart: regular rate and rhythm, S1, S2 normal, no murmur, click, rub or gallop Pulses: 2+ and symmetric       Assessment & Plan:

## 2012-04-02 NOTE — Assessment & Plan Note (Signed)
Discussed importance of avoidance of NSAIDS and omeprazole compliance.

## 2012-04-02 NOTE — Assessment & Plan Note (Signed)
Improved with iron supplementation.  Discussed importance of compliance with this, discussed I do not think this is cause of her weakness spells.

## 2012-04-02 NOTE — Patient Instructions (Addendum)
Your hemoglobin last visit was 11.9, and your iron level was also improved.  Please continue to take the iron supplement.   For your gastritis- please do not take any Aleve, Advil, Motrin, Ibuprofen or Asprin.  Please take your omeprazole every day.   Please be sure to drink plenty of fluids every day.

## 2012-04-02 NOTE — Assessment & Plan Note (Signed)
Orthostatics negative today, CBC, CMET, TSH all normal except mild anemia (see above).  Discussed unclear etiology- possibly related to emotional issues or dehydration.  Pt reassured.

## 2012-05-01 ENCOUNTER — Telehealth: Payer: Self-pay | Admitting: *Deleted

## 2012-05-01 NOTE — Telephone Encounter (Signed)
Dominique [name] walked in to [facility] office 04-30-2012 at 4:55PM with questions about continued swelling and "stinging" sensation in her lower extremities.  Dominique [name] is s/p endovenous laser ablation of left greater saphenous vein and stab phlebectomy 10-20 incisions left leg by Gretta [name] on 02-29-2012 and s/p endovenous laser ablation of right greater saphenous vein and stab phlebectomy 10-20 incisions right leg by Gretta Began MD on 02-01-2012.  One week following each procedure she had a venous duplex study of the respective legs that showed no DVT and closure of the respective greater vein that was treated.  Encouraged Dominique [name] to continue wearing compression stockings and keep her legs elevated frequently. 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  I told her I would review her medical record and call her on 05-01-2012 to continue the discussion.  On 05-01-2012 after reviewing her medical records, I called  Dominique [name] and discussed the results of her initial duplex exams of the right and left legs which both showed she has deep venous reflux in the right and left legs.  Explained to Dominique [name] that the deep venous reflux could be responsible for leg swelling and recommended that she wear her thigh high compression hose daily and elevate both legs as much as possible as these activities are the mainstay of treatment for deep venous reflux.  Offered appointment to see Dr. Arbie Cookey but Dominique [name] declined and seems reassured.  Requested that she call [facility] if symptoms worsened or if she experienced severe pain or swelling in calves, feet, or ankles.

## 2012-05-14 ENCOUNTER — Ambulatory Visit (INDEPENDENT_AMBULATORY_CARE_PROVIDER_SITE_OTHER): Payer: No Typology Code available for payment source | Admitting: Family Medicine

## 2012-05-14 VITALS — BP 116/81 | HR 94 | Temp 98.2°F | Ht <= 58 in | Wt 186.0 lb

## 2012-05-14 DIAGNOSIS — R300 Dysuria: Secondary | ICD-10-CM

## 2012-05-14 DIAGNOSIS — B373 Candidiasis of vulva and vagina: Secondary | ICD-10-CM

## 2012-05-14 DIAGNOSIS — N898 Other specified noninflammatory disorders of vagina: Secondary | ICD-10-CM

## 2012-05-14 DIAGNOSIS — J069 Acute upper respiratory infection, unspecified: Secondary | ICD-10-CM

## 2012-05-14 LAB — POCT URINALYSIS DIPSTICK
Ketones, UA: NEGATIVE
Protein, UA: NEGATIVE
Spec Grav, UA: 1.025
Urobilinogen, UA: 0.2
pH, UA: 6

## 2012-05-14 LAB — POCT WET PREP (WET MOUNT): Clue Cells Wet Prep Whiff POC: NEGATIVE

## 2012-05-14 LAB — POCT UA - MICROSCOPIC ONLY: Epithelial cells, urine per micros: >20

## 2012-05-14 MED ORDER — FLUCONAZOLE 150 MG PO TABS: 150.0000 mg | ORAL_TABLET | Freq: Once | ORAL | Status: DC

## 2012-05-14 NOTE — Assessment & Plan Note (Signed)
Neg wet prep, normal [place].  exam and symptoms consistent with yeast vaginitis.  Will treat empirically.  Advised if continues to have bladder symptoms, bring records from previous urology treatment to discuss with primary doctor

## 2012-05-14 NOTE — Progress Notes (Signed)
  Subjective:    Patient ID: Dominique [name], female    DOB: 1975-09-29, 37 y.o.   MRN: [id number]  HPI  4 days of URI  Fatigue, doubled up on zyrtec. 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  Sore throat, itchy, no cough or trouble breathing.  Had fever 2-3 days ago.  Diarrhea yesterday but thinks it may be consistent with IBS.  Patient is concerned due to her mother being in the hospital.  ? Dysuria:  Has been drinking juice to help.  Burning when urinating and at rest.  No vaginal discharge.  No urinary freuqency.  I have reviewed patient's  PMH, FH, and Social history and Medications as related to this visit. History of interstitial cystitis. Review of Systems See HPI    Objective:   Physical Exam GEN: Alert & Oriented, No acute distress HEENT: /AT. EOMI, PERRLA, no conjunctival injection or scleral icterus.  Bilateral tympanic membranes intact without erythema or effusion.  .  Nares without edema or rhinorrhea.  Oropharynx is without erythema or exudates.  No anterior or posterior cervical lymphadenopathy. CV:  Regular Rate & Rhythm, no murmur Respiratory:  Normal work of breathing, CTAB Abd:  + BS, soft, no tenderness to palpation Pelvic Exam:        External: normal female genitalia without lesions or masses        Vagina: normal without lesions or masses        Cervix: normal without lesions or masses        Adnexa: normal bimanual exam without masses or fullness        Uterus: normal by palpation        Samples for Wet prep obtained        Assessment & Plan:

## 2012-05-14 NOTE — Assessment & Plan Note (Signed)
Discussed supportive care and red flags for return

## 2012-05-14 NOTE — Patient Instructions (Addendum)
Follow-up with primary doctor.  Would be helpful to bring records from your urologist.  Upper Respiratory Infection, Adult An upper respiratory infection (URI) is also known as the common cold. It is often caused by a type of germ (virus). Colds are easily spread (contagious). You can pass it to others by kissing, coughing, sneezing, or drinking out of the same glass. Usually, you get better in 1 or 2 weeks.  HOME CARE   Only take medicine as told by your doctor.  Use a warm mist humidifier or breathe in steam from a hot shower.  Drink enough water and fluids to keep your pee (urine) clear or pale yellow.  Get plenty of rest.  Return to work when your temperature is back to normal or as told by your doctor. You may use a face mask and wash your hands to stop your cold from spreading. GET HELP RIGHT AWAY IF:   After the first few days, you feel you are getting worse.  You have questions about your medicine.  You have chills, shortness of breath, or brown or red spit (mucus).  You have yellow or brown snot (nasal discharge) or pain in the face, especially when you bend forward.  You have a fever, puffy (swollen) neck, pain when you swallow, or white spots in the back of your throat.  You have a bad headache, ear pain, sinus pain, or chest pain.  You have a high-pitched whistling sound when you breathe in and out (wheezing).  You have a lasting cough or cough up blood.  You have sore muscles or a stiff neck. MAKE SURE YOU:   Understand these instructions.  Will watch your condition.  Will get help right away if you are not doing well or get worse. Document Released: 06/14/2007 Document Revised: 03/20/2011 Document Reviewed: 05/02/2010 [facility] Patient Information 2013 [place], [place].

## 2012-05-15 ENCOUNTER — Telehealth: Payer: Self-pay | Admitting: Family Medicine

## 2012-05-15 NOTE — Telephone Encounter (Signed)
Patient was seen yesterday and dx'd with a uri, no abx given, but now she feels like both ears are infected and she was hoping that an abx can be called in for her.  She would like to speak to the triage nurse for other suggestions as well.

## 2012-05-15 NOTE — Telephone Encounter (Signed)
Pt states that her URI symptoms are not getting any better and that both ears are hurting. She would like to have antibiotic called into pharmacy. Please advise. Hayla Hinger, Harold Hedge, RN

## 2012-05-16 NOTE — Telephone Encounter (Signed)
Returned call to pt and advised again regarding antibiotics not treating viral infections. Pt states that she is no better and left work last night due to ear pain. Gave option to schedule appointment and did so for Friday May 9 at 9:30 with Dr. Gwendolyn Grant.Wyatt Haste, RN-BSN

## 2012-05-16 NOTE — Telephone Encounter (Signed)
Antibiotics do not treat viral illnesses.  I have read Dr. Leonie Green note, antibiotics are not appropriate.  Please call her and let her know she can make a same day visit if she is not feeling better.

## 2012-05-17 ENCOUNTER — Ambulatory Visit: Payer: No Typology Code available for payment source | Admitting: Family Medicine

## 2012-06-07 ENCOUNTER — Ambulatory Visit (INDEPENDENT_AMBULATORY_CARE_PROVIDER_SITE_OTHER): Payer: No Typology Code available for payment source | Admitting: Family Medicine

## 2012-06-07 ENCOUNTER — Encounter: Payer: Self-pay | Admitting: Family Medicine

## 2012-06-07 VITALS — BP 103/61 | HR 81 | Temp 99.2°F | Ht <= 58 in | Wt 190.0 lb

## 2012-06-07 DIAGNOSIS — E669 Obesity, unspecified: Secondary | ICD-10-CM
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
## 2012-06-07 NOTE — Patient Instructions (Signed)
It was good to see you.  I want you to try to eventually eliminate any drinks with sugar/calories.  For now, try to cut down.  Replace one-two soda a day with a crystal lite, diet drink, etc.  If you can replace one with water that is great too.   To help you work on improving your nutrition, remember the plate rule for each meal:  - 1/4 of the plate or less should be a whole grain starch (brown rice, whole grain pasta, wheat bread, etc.)  - 1/4 of the plate should be a lean source of protein (chicken, [place], fish, beans, egg whites).  - 1/2 the plate or more should be fruits and vegetables - the more vegetables the better!   Please come back and see me in one week.

## 2012-06-07 NOTE — Progress Notes (Signed)
Medical Nutrition Therapy:  Appt start time: 1100 end time:  1130.  Assessment:  Primary concerns today: Weight management.   Usual eating pattern includes 2 meals and 1-2 snacks per day. Patient works 2nd shift, difficult to eat dinner.    Usual physical activity includes occasional work-out video.  Frequent foods include sweets- brownies, little debbie's, twix, grilled chicken, baked potato.  Avoided foods include eggs (?allergy), broccoli, brussels sprouts.    24-hr recall: (Up at  AM) 7:30 B ( AM)-  sand which PBJ, white bread  Snk ( AM)-  Weight watchers ice cream bar  L ( PM)- stew beef and rice and lima beans Snk ( PM)- cream cheese breakfast bars D ( PM)- stew beef and rice and lima beans  Snk ( PM)- none Drinks: 2 16 oz pepsi. 1 can mountain dew  Progress Towards Goal(s):  In progress.

## 2012-06-18 ENCOUNTER — Ambulatory Visit (INDEPENDENT_AMBULATORY_CARE_PROVIDER_SITE_OTHER): Payer: No Typology Code available for payment source | Admitting: Family Medicine

## 2012-06-18 VITALS — BP 110/80 | HR 103 | Temp 98.8°F | Wt 189.0 lb

## 2012-06-18 DIAGNOSIS — M25571 Pain in right ankle and joints of right foot: Secondary | ICD-10-CM

## 2012-06-18 DIAGNOSIS — M7989 Other specified soft tissue disorders: Secondary | ICD-10-CM

## 2012-06-18 DIAGNOSIS — M25579 Pain in unspecified ankle and joints of unspecified foot: Secondary | ICD-10-CM | POA: Insufficient documentation

## 2012-06-18 DIAGNOSIS — M79609 Pain in unspecified limb: Secondary | ICD-10-CM

## 2012-06-18 NOTE — Assessment & Plan Note (Signed)
Feel this is dependent edema from venous insufficiency.  Advised compression hose.

## 2012-06-18 NOTE — Patient Instructions (Signed)
I think your ankle pain and swelling is from two things:  Chronic ankle sprain Venous insufficiency (the veins not pumping the blood back well).  Please wear the ankle brace on your Right ankle when you are on your feet.  I also suggest you get compression hose for your legs.   Please ice your ankles after exercise and after work.   Please see the attached exercises, you should do these twice a day to make your ankle stronger.

## 2012-06-18 NOTE — Progress Notes (Signed)
  Subjective:    Patient ID: Dominique [name], female    DOB: [birth year], 37 y.o.   MRN: [id number]  HPI  Dominique [name] comes in for bilateral ankle swelling and pain, R>L.  She broke her R ankle years ago, and had to wear a boot.  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  She sees vascular surgery for her painful verucose veins, and they have looked for a DVT and told her she has venous insufficiency.   Review of Systems See HPI    Objective:   Physical Exam BP 110/80  Pulse 103  Temp(Src) 98.8 F (37.1 C) (Oral)  Wt 189 lb (85.73 kg)  BMI 43.12 kg/m2 General appearance: alert, cooperative and no distress LE: Non pitting edema of bilaterally LE Ankle: Range of motion is full in all directions. Strength is 5/5 in all directions. Lateral ligaments tender to palpation and pain with soupination Talar dome nontender; No pain at base of 5th MT; No tenderness over cuboid; No tenderness over N spot or navicular prominence No tenderness on posterior aspects of lateral and medial malleolus     Assessment & Plan:

## 2012-06-18 NOTE — Assessment & Plan Note (Signed)
I reviewed x-rays from 2013, no sign of arthritis, can see healed fracture.  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  Gave hand out with ankle rehab exercises, and ASO brace for R ankle.  Advised to ice ankle after long shifts at work.  F/U if not improving.

## 2012-08-05 ENCOUNTER — Ambulatory Visit: Payer: Self-pay | Admitting: Family Medicine

## 2012-08-27 ENCOUNTER — Emergency Department (HOSPITAL_COMMUNITY)
Admission: EM | Admit: 2012-08-27 | Discharge: 2012-08-27 | Disposition: A | Discharge status: Home / Self Care | Payer: No Typology Code available for payment source | Attending: Emergency Medicine | Admitting: Emergency Medicine

## 2012-08-27 ENCOUNTER — Emergency Department (HOSPITAL_COMMUNITY): Payer: No Typology Code available for payment source

## 2012-08-27 ENCOUNTER — Encounter (HOSPITAL_COMMUNITY): Payer: Self-pay | Admitting: Emergency Medicine

## 2012-08-27 DIAGNOSIS — H538 Other visual disturbances: Secondary | ICD-10-CM | POA: Insufficient documentation

## 2012-08-27 DIAGNOSIS — M79609 Pain in unspecified limb: Secondary | ICD-10-CM | POA: Insufficient documentation

## 2012-08-27 DIAGNOSIS — M79662 Pain in left lower leg: Secondary | ICD-10-CM

## 2012-08-27 DIAGNOSIS — K219 Gastro-esophageal reflux disease without esophagitis: Secondary | ICD-10-CM | POA: Insufficient documentation

## 2012-08-27 DIAGNOSIS — Z8679 Personal history of other diseases of the circulatory system: Secondary | ICD-10-CM | POA: Insufficient documentation

## 2012-08-27 DIAGNOSIS — R531 Weakness: Secondary | ICD-10-CM

## 2012-08-27 DIAGNOSIS — Z86718 Personal history of other venous thrombosis and embolism: Secondary | ICD-10-CM | POA: Insufficient documentation

## 2012-08-27 DIAGNOSIS — Z3202 Encounter for pregnancy test, result negative: Secondary | ICD-10-CM | POA: Insufficient documentation

## 2012-08-27 DIAGNOSIS — R5381 Other malaise: Secondary | ICD-10-CM | POA: Insufficient documentation

## 2012-08-27 DIAGNOSIS — M25579 Pain in unspecified ankle and joints of unspecified foot: Secondary | ICD-10-CM | POA: Insufficient documentation

## 2012-08-27 DIAGNOSIS — Z79899 Other long term (current) drug therapy: Secondary | ICD-10-CM | POA: Insufficient documentation

## 2012-08-27 DIAGNOSIS — Z91040 Latex allergy status: Secondary | ICD-10-CM | POA: Insufficient documentation

## 2012-08-27 LAB — BASIC METABOLIC PANEL
BUN: 9 mg/dL (ref 6–23)
CO2: 25 mEq/L (ref 19–32)
Calcium: 9.3 mg/dL (ref 8.4–10.5)
Chloride: 101 mEq/L (ref 96–112)
Creatinine, Ser: 0.69 mg/dL (ref 0.50–1.10)
GFR calc Af Amer: >90 mL/min (ref >90)
GFR calc non Af Amer: >90 mL/min (ref >90)
Glucose, Bld: 82 mg/dL (ref 70–99)
Potassium: 3.7 mEq/L (ref 3.5–5.1)
Sodium: 137 mEq/L (ref 135–145)

## 2012-08-27 LAB — CBC WITH DIFFERENTIAL/PLATELET
Basophils Absolute: 0 10*3/uL (ref 0.0–0.1)
Basophils Relative: 0 % (ref 0–1)
Eosinophils Absolute: 0.3 10*3/uL (ref 0.0–0.7)
Eosinophils Relative: 3 % (ref 0–5)
HCT: 41.4 % (ref 36.0–46.0)
Hemoglobin: 13.8 g/dL (ref 12.0–15.0)
Lymphocytes Relative: 32 % (ref 12–46)
Lymphs Abs: 3.2 10*3/uL (ref 0.7–4.0)
MCH: 31.2 pg (ref 26.0–34.0)
MCHC: 33.3 g/dL (ref 30.0–36.0)
MCV: 93.5 fL (ref 78.0–100.0)
Monocytes Absolute: 0.7 10*3/uL (ref 0.1–1.0)
Monocytes Relative: 7 % (ref 3–12)
Neutro Abs: 5.8 10*3/uL (ref 1.7–7.7)
Neutrophils Relative %: 58 % (ref 43–77)
Platelets: 386 10*3/uL (ref 150–400)
RBC: 4.43 MIL/uL (ref 3.87–5.11)
RDW: 12.6 % (ref 11.5–15.5)
WBC: 10 10*3/uL (ref 4.0–10.5)

## 2012-08-27 LAB — URINALYSIS, ROUTINE W REFLEX MICROSCOPIC
Bilirubin Urine: NEGATIVE
Glucose, UA: NEGATIVE mg/dL
Hgb urine dipstick: NEGATIVE
Ketones, ur: NEGATIVE mg/dL
Leukocytes, UA: NEGATIVE
Nitrite: NEGATIVE
Protein, ur: NEGATIVE mg/dL
Specific Gravity, Urine: 1.015 (ref 1.005–1.030)
Urobilinogen, UA: 0.2 mg/dL (ref 0.0–1.0)
pH: 6.5 (ref 5.0–8.0)

## 2012-08-27 LAB — PREGNANCY, URINE: Preg Test, Ur: NEGATIVE

## 2012-08-27 MED ORDER — ENOXAPARIN SODIUM 100 MG/ML ~~LOC~~ SOLN
1.0000 mg/kg | Freq: Once | SUBCUTANEOUS | Status: AC
  Administered 2012-08-27: 85 mg via SUBCUTANEOUS
  Filled 2012-08-27: qty 1

## 2012-08-27 MED ORDER — PROMETHAZINE HCL 25 MG/ML IJ SOLN
12.5000 mg | Freq: Once | INTRAMUSCULAR | Status: AC
  Administered 2012-08-27: 12.5 mg via INTRAVENOUS
  Filled 2012-08-27: qty 1

## 2012-08-27 MED ORDER — IBUPROFEN 800 MG PO TABS: 800.0000 mg | ORAL_TABLET | Freq: Three times a day (TID) | ORAL | Status: DC | PRN

## 2012-08-27 MED ORDER — SODIUM CHLORIDE 0.9 % IV BOLUS (SEPSIS)
1000.0000 mL | Freq: Once | INTRAVENOUS | Status: AC
  Administered 2012-08-27: 1000 mL via INTRAVENOUS

## 2012-08-27 MED ORDER — KETOROLAC TROMETHAMINE 30 MG/ML IJ SOLN
30.0000 mg | Freq: Once | INTRAMUSCULAR | Status: AC
  Administered 2012-08-27: 30 mg via INTRAVENOUS
  Filled 2012-08-27: qty 1

## 2012-08-27 NOTE — ED Provider Notes (Signed)
CSN: [id number]     Arrival date & time 08/27/12  1731 History     First MD Initiated Contact with Patient 08/27/12 1924     Chief Complaint  Patient presents with  . Ankle Pain  . Weakness  . Blurred Vision   (Consider location/radiation/quality/duration/timing/severity/associated sxs/prior Treatment) HPI Patient presents emergency department with left foot pain, weakness, and blurry vision.  Patient, states she's had a lot going on with a family member, who is sick in the hospital.  Patient, states she stands on her feet a lot, and she feels this may be causing pain in her foot.  Patient denies chest pain, shortness of breath headache nausea, vomiting, diarrhea, pain, fever, cough, neck pain, back pain dizziness, or syncope.  The patient, states she did not take any medications prior to arrival for her symptoms.  The patient, states she does have a primary care Dr. who she is to followup with in the next few days.  Patient had low hemoglobin in the past, and that's what caused her weakness previously.  Patient is also complaining of left calf pain with a history of DVT Past Medical History  Diagnosis Date  . Raynaud's disease   . DVT (deep venous thrombosis)   . Raynaud disease 12/21/2011  . Varicose veins   . GERD (gastroesophageal reflux disease)    Past Surgical History  Procedure Laterality Date  . Abdominal hysterectomy    . Cesarean section    . Appendectomy    . Hernia repair  2006  . Endovenous ablation saphenous vein w/ laser  02-01-2012    right greater saphenous vein and stab phlebectomy 10-20 incisions right leg  by Gretta Began, MD  . Endovenous ablation saphenous vein w/ laser Left 02-29-2012    left greater saphenous vein and stab phlebectomy left leg 10-20 incisions  by Gretta Began MD  . Esophagogastroduodenoscopy (egd) with propofol N/A 03/26/2012    Procedure: ESOPHAGOGASTRODUODENOSCOPY (EGD) WITH PROPOFOL;  Surgeon: Florencia Reasons, MD;  Location: [facility];   Service: Endoscopy;  Laterality: N/A;  . Colonoscopy with propofol N/A 03/26/2012    Procedure: COLONOSCOPY WITH PROPOFOL;  Surgeon: Florencia Reasons, MD;  Location: [facility];  Service: Endoscopy;  Laterality: N/A;   Family History  Problem Relation Age of Onset  . Diabetes Mother   . Heart disease Mother   . Hyperlipidemia Mother   . Hypertension Mother   . Other Mother     amputation, varicose veins  . Diabetes Father   . Hyperlipidemia Father   . Hypertension Father   . Peripheral vascular disease Father   . Other Father     varicose veins  . Deep vein thrombosis Brother   . Other Brother     varicose veins   History  Substance Use Topics  . Smoking status: Never Smoker   . Smokeless tobacco: Never Used  . Alcohol Use: No   OB History   Grav Para Term Preterm Abortions TAB SAB Ect Mult Living                 Review of Systems All other systems negative except as documented in the HPI. All pertinent positives and negatives as reviewed in the HPI. Allergies  Contrast media; Latex; Zofran; Eggs or egg-derived products; and Lactose intolerance (gi)  Home Medications   Current Outpatient Rx  Name  Route  Sig  Dispense  Refill  . acetaminophen (TYLENOL) 500 MG tablet   Oral   Take 1,000  mg by mouth every 6 (six) hours as needed. For pain.         . cetirizine (ZYRTEC) 10 MG tablet   Oral   Take 1 tablet (10 mg total) by mouth daily.   30 tablet   11   . ferrous sulfate 325 (65 FE) MG EC tablet   Oral   Take 325 mg by mouth daily with breakfast.         . Multiple Vitamin (MULTIVITAMIN WITH MINERALS) TABS   Oral   Take 1 tablet by mouth daily.         Marland Kitchen omeprazole (PRILOSEC) 20 MG capsule   Oral   Take 20 mg by mouth daily.          BP 106/71  Pulse 72  Temp(Src) 98.9 F (37.2 C) (Oral)  Resp 17  SpO2 95% Physical Exam  Nursing note and vitals reviewed. Constitutional: She is oriented to person, place, and time. She appears  well-developed and well-nourished. No distress.  HENT:  Head: Normocephalic and atraumatic.  Mouth/Throat: Oropharynx is clear and moist.  Eyes: Pupils are equal, round, and reactive to light.  Neck: Normal range of motion. Neck supple.  Cardiovascular: Normal rate, regular rhythm and normal heart sounds.  Exam reveals no gallop and no friction rub.   No murmur heard. Pulmonary/Chest: Effort normal and breath sounds normal. No respiratory distress.  Abdominal: Soft. Bowel sounds are normal.  Musculoskeletal:  Patient has trace edema, bilaterally and she does have palpable left calf pain  Neurological: She is alert and oriented to person, place, and time. Coordination normal.  Skin: Skin is warm and dry. No rash noted. No erythema.    ED Course   Procedures (including critical care time)  Labs Reviewed  CBC WITH DIFFERENTIAL  BASIC METABOLIC PANEL  PREGNANCY, URINE  URINALYSIS, ROUTINE W REFLEX MICROSCOPIC   Dg Foot Complete Left  08/27/2012   *RADIOLOGY REPORT*  Clinical Data: Left foot pain without injury.  LEFT FOOT - COMPLETE 3+ VIEW  Comparison: None.  Findings: No acute fracture or dislocation is noted.  No gross soft tissue abnormality is seen.  A small plantar spur is noted.  An accessory navicular bone is noted medially.  IMPRESSION: No acute abnormality noted.   Original Report Authenticated By: Alcide Clever, M.D.   Patient be given pain control, and also, I will order a venous Doppler of the left lower extremity tomorrow morning.  We will give her Lovenox as a prophylactic measure patient is advised to return here as needed.  Also advised to continue to followup with her primary care Dr.  MDM    Carlyle Dolly, PA-C 08/27/12 2239

## 2012-08-27 NOTE — ED Notes (Signed)
Pt c/o of left ankle pain, weakness, fatigue, blurred vision. States that she has not been getting enough sleep. Hx DVT. NAD at this time.

## 2012-08-28 ENCOUNTER — Ambulatory Visit (HOSPITAL_COMMUNITY)
Admission: RE | Admit: 2012-08-28 | Discharge: 2012-08-28 | Disposition: A | Discharge status: Home / Self Care | Payer: No Typology Code available for payment source | Source: Ambulatory Visit | Attending: Emergency Medicine | Admitting: Emergency Medicine

## 2012-08-28 ENCOUNTER — Ambulatory Visit (INDEPENDENT_AMBULATORY_CARE_PROVIDER_SITE_OTHER): Payer: No Typology Code available for payment source | Admitting: Family Medicine

## 2012-08-28 ENCOUNTER — Encounter: Payer: Self-pay | Admitting: Family Medicine

## 2012-08-28 VITALS — BP 111/78 | HR 73 | Temp 98.3°F | Ht <= 58 in | Wt 189.0 lb

## 2012-08-28 DIAGNOSIS — M79609 Pain in unspecified limb: Secondary | ICD-10-CM

## 2012-08-28 DIAGNOSIS — M7989 Other specified soft tissue disorders: Secondary | ICD-10-CM

## 2012-08-28 DIAGNOSIS — M79672 Pain in left foot: Secondary | ICD-10-CM | POA: Insufficient documentation

## 2012-08-28 DIAGNOSIS — M79605 Pain in left leg: Secondary | ICD-10-CM

## 2012-08-28 DIAGNOSIS — M79604 Pain in right leg: Secondary | ICD-10-CM | POA: Insufficient documentation

## 2012-08-28 NOTE — Assessment & Plan Note (Signed)
Believe that this is most likely a soft tissue injury given negative imaging. Advised to use ibuprofen, ice, and elevation.

## 2012-08-28 NOTE — Assessment & Plan Note (Signed)
Calf pain. Has already had LE dopplers to evaluate for DVT. Will f/u on these results and treat accordingly. Given patient report of no clot, is likely MSK in origin.

## 2012-08-28 NOTE — Progress Notes (Signed)
VASCULAR LAB PRELIMINARY  PRELIMINARY  PRELIMINARY  PRELIMINARY  Left lower extremity venous duplex completed.    Preliminary report:  Left:  No evidence of DVT, superficial thrombosis, or Baker's cyst.  Mendell Bontempo, RVT 08/28/2012, 10:07 AM

## 2012-08-28 NOTE — ED Provider Notes (Signed)
Medical screening examination/treatment/procedure(s) were performed by non-physician practitioner and as supervising physician I was immediately available for consultation/collaboration.  Layla Maw Jakorey Mcconathy, DO 08/28/12 0006

## 2012-08-28 NOTE — Patient Instructions (Signed)
Nice to meet you today. For your foot you can take ibuprofen, use ice, and elevate it for the pain. There is no fracture. We will let you know if there is a blood clot in your leg.

## 2012-08-28 NOTE — Progress Notes (Signed)
  Subjective:    Patient ID: Dominique [name], female    DOB: Jun 20, 1975, 37 y.o.   MRN: [id number]  HPI Patient is a 37 yo female who presents for evaluation of left fore foot swelling.  Started last Tuesday while she was cleaning a 3 story house. Does not remember injuring it specifically. Middle of lateral fore foot. Hurts to bend it. Noticed some swelling. Went to ED last night due to being fed up with the pain. XR revealed no acute fracture. Also noted some calf pain at that time and LE dopplers were ordered that were completed this morning. She was given a shot of lovenox int he ED prophylactically. Notably has a history of what sounds like varicose veins, though she thought it was the same as a DVT. Denies chest pain or difficulty breathing. States was told at the [place] by the tech that there was no clot.    Review of Systems see HPI     Objective:   Physical Exam  Constitutional: She appears well-developed and well-nourished.  HENT:  Head: Normocephalic and atraumatic.  Cardiovascular: Normal rate and regular rhythm.   Pulmonary/Chest: Effort normal and breath sounds normal.  Musculoskeletal:  Left fore foot with no apparent abnormality, swelling, or tenderness to palpation. Left calf with out tenderness, swelling, erythema, or cords. Both calves measured 43 cm.  Neurological: She is alert.  Skin: Skin is warm and dry.  BP 111/78  Pulse 73  Temp(Src) 98.3 F (36.8 C) (Oral)  Ht 4\' 7"  (1.397 m)  Wt 189 lb (85.73 kg)  BMI 43.93 kg/m2     Assessment & Plan:

## 2012-09-02 ENCOUNTER — Encounter: Payer: Self-pay | Admitting: Family Medicine

## 2012-09-02 ENCOUNTER — Ambulatory Visit (INDEPENDENT_AMBULATORY_CARE_PROVIDER_SITE_OTHER): Payer: No Typology Code available for payment source | Admitting: Emergency Medicine

## 2012-09-02 ENCOUNTER — Encounter: Payer: Self-pay | Admitting: Emergency Medicine

## 2012-09-02 VITALS — BP 128/84 | HR 66 | Ht <= 58 in | Wt 190.0 lb

## 2012-09-02 DIAGNOSIS — M79604 Pain in right leg: Secondary | ICD-10-CM

## 2012-09-02 DIAGNOSIS — M79609 Pain in unspecified limb: Secondary | ICD-10-CM

## 2012-09-02 MED ORDER — KETOROLAC TROMETHAMINE 60 MG/2ML IJ SOLN
60.0000 mg | Freq: Once | INTRAMUSCULAR | Status: AC
  Administered 2012-09-02: 60 mg via INTRAMUSCULAR

## 2012-09-02 NOTE — Progress Notes (Signed)
  Subjective:    Patient ID: Dominique [name], female    DOB: 02-20-1975, 37 y.o.   MRN: [id number]  HPI Jaqueline Uber is here for a SDA for leg pain.  She reports that she has bilateral lower leg pain, worse on the left.  She states this started last week with pain and swelling in her left foot. She was seen by Dr. Antony Haste at that time who recommended conservative therapy with RICE.  She was seen in the ED on 8/19 and had an negative x-ray and negative dopplers.  Today she states the pain is worse and now involves both lower legs.  Described as a constant achy, "crippling," pain and as a heavy feeling.  Feels like her legs are weak.  No numbness or tingling.  No bowel or bladder incontinence.  Swelling is much better today.  She reports an episode of low back pain about 3-4 weeks ago, but none currently.  No chest pain or shortness of breath.  She does have varicose veins and had surgery on the left leg in January of this year.  She has been taking ibuprofen which helps some.  I have reviewed and updated the following as appropriate: allergies and current medications SHx: never smoker   Review of Systems See HPI    Objective:   Physical Exam BP 128/84  Pulse 66  Ht 4\' 7"  (1.397 m)  Wt 190 lb (86.183 kg)  BMI 44.16 kg/m2 Gen: alet, cooperative, NAD Ext: no edema, 2+ DP pulses bilaterally, minimally tender to palpation along bilateral shins Neuro: 4/5 strength through bilateral lower extremities but very poor effort  Left doppler reviewed and negative for DVT.     Assessment & Plan:

## 2012-09-02 NOTE — Patient Instructions (Addendum)
It was nice to meet you!  I do not see anything bad going on.  This pain may be coming from the varicose veins.  Please wear your support stockings as much as you can.  You can take ibuprofen up to 800mg  3 times a day.  Follow up in 2 weeks if not improving.

## 2012-09-02 NOTE — Telephone Encounter (Signed)
error 

## 2012-09-02 NOTE — Assessment & Plan Note (Signed)
Worse on left. No radicular signs. Dopplers negative for DVT. I suspect venous claudication from varicose veins. Discussed support stockings and weight loss.  She will discuss metformin or topomax to help with weight loss with PCP. Toradol 60mg  IM today, start ibuprofn 800mg  TID prn tomorrow. Follow up if not improving in 2 weeks.

## 2012-09-17 ENCOUNTER — Ambulatory Visit: Payer: No Typology Code available for payment source | Admitting: Family Medicine

## 2012-09-24 ENCOUNTER — Telehealth: Payer: Self-pay | Admitting: Family Medicine

## 2012-09-24 NOTE — Telephone Encounter (Signed)
pts mother died on sept 41. 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 She knows she has a yeast infection and would like the pill called in to the [facility] on [address] Please advise

## 2012-09-25 ENCOUNTER — Other Ambulatory Visit: Payer: Self-pay | Admitting: Family Medicine

## 2012-09-25 MED ORDER — FLUCONAZOLE 150 MG PO TABS: 150.0000 mg | ORAL_TABLET | Freq: Once | ORAL | Status: DC

## 2012-09-27 ENCOUNTER — Ambulatory Visit (INDEPENDENT_AMBULATORY_CARE_PROVIDER_SITE_OTHER): Payer: No Typology Code available for payment source | Admitting: Family Medicine

## 2012-09-27 ENCOUNTER — Telehealth: Payer: Self-pay | Admitting: Family Medicine

## 2012-09-27 VITALS — BP 121/77 | HR 73 | Temp 98.8°F | Ht <= 58 in | Wt 189.5 lb

## 2012-09-27 DIAGNOSIS — IMO0002 Reserved for concepts with insufficient information to code with codable children: Secondary | ICD-10-CM

## 2012-09-27 DIAGNOSIS — Z0000 Encounter for general adult medical examination without abnormal findings: Secondary | ICD-10-CM

## 2012-09-27 DIAGNOSIS — R300 Dysuria: Secondary | ICD-10-CM | POA: Insufficient documentation

## 2012-09-27 LAB — POCT URINALYSIS DIPSTICK
Blood, UA: NEGATIVE
Glucose, UA: NEGATIVE
Ketones, UA: NEGATIVE
Nitrite, UA: NEGATIVE
Spec Grav, UA: >=1.03
pH, UA: 6

## 2012-09-27 MED ORDER — PHENAZOPYRIDINE HCL 100 MG PO TABS: 200.0000 mg | ORAL_TABLET | Freq: Three times a day (TID) | ORAL | Status: DC | PRN

## 2012-09-27 NOTE — Patient Instructions (Addendum)
He seems that he might have a bladder irritation. Your urinalysis was negative for bacteria of blood or leukocytes. Please take medication as prescribed for 2 days and followup if you develop any fever, nausea, vomiting, or new onset back pain.

## 2012-09-27 NOTE — Assessment & Plan Note (Signed)
Negative UA. No fever flank pain or other symptoms that suggest pylo. We'll start Pyridium at 200mg   3 times a day for 2 days and followup as needed. Increase fluid intake. Discussed signs of worsening condition that should prompt re-evaluation.

## 2012-09-27 NOTE — Progress Notes (Signed)
Family Medicine Office Visit Note   Subjective:   Patient ID: Dominique [name], female  DOB: 13-Dec-1975, 37 y.o.. MRN: [id number]   Pt that comes today for same the appointment complaining of dysuria for about 3 days. She denies frequency, fever or flank pain. He she reports had history of cystitis in the past and started taking over-the-counter Phenazopyridine 1 tab (97mg ) last night.   Also pt is requesting in UDS. 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 She recently lost her mother and is in the process of burying the body. She reports is grieving but is able to function. Denies suicidal thoughts or plans.   Review of Systems:  Per history of present illness  Objective:   Physical Exam: Gen:  NAD HEENT: Moist mucous membranes  CV: Regular rate and rhythm, no murmurs rubs or gallops PULM: Clear to auscultation bilaterally. No wheezes/rales/rhonchi ABD: Soft, non tender, non distended, normal bowel sounds. No CVA tenderness. EXT: No edema Neuro: Alert and oriented x3. No focalization  Assessment & Plan:

## 2012-09-28 LAB — DRUG SCREEN, URINE
Amphetamine Screen, Ur: NEGATIVE
Barbiturate Quant, Ur: NEGATIVE
Benzodiazepines.: NEGATIVE
Cocaine Metabolites: NEGATIVE
Creatinine,U: 480.85 mg/dL
Marijuana Metabolite: NEGATIVE
Methadone: NEGATIVE
Opiates: NEGATIVE
Phencyclidine (PCP): NEGATIVE
Propoxyphene: NEGATIVE

## 2012-10-02 NOTE — Progress Notes (Signed)
Chart open twice by mistake. Visit already documented

## 2012-10-08 ENCOUNTER — Ambulatory Visit: Payer: No Typology Code available for payment source | Admitting: Family Medicine

## 2012-10-09 ENCOUNTER — Telehealth: Payer: Self-pay | Admitting: *Deleted

## 2012-10-09 NOTE — Telephone Encounter (Signed)
Patient calling to request copy of her recent UDS results.  Needs 2 copies (for employer and drug treatment program).  Copies placed at front desk and patient will pick up today.  Gaylene Brooks, RN

## 2012-10-15 ENCOUNTER — Ambulatory Visit (INDEPENDENT_AMBULATORY_CARE_PROVIDER_SITE_OTHER): Payer: No Typology Code available for payment source | Admitting: Family Medicine

## 2012-10-15 ENCOUNTER — Encounter: Payer: Self-pay | Admitting: Family Medicine

## 2012-10-15 ENCOUNTER — Other Ambulatory Visit (HOSPITAL_COMMUNITY)
Admission: RE | Admit: 2012-10-15 | Discharge: 2012-10-15 | Disposition: A | Discharge status: Home / Self Care | Payer: No Typology Code available for payment source | Source: Ambulatory Visit | Attending: Family Medicine | Admitting: Family Medicine

## 2012-10-15 VITALS — BP 103/73 | HR 82 | Ht <= 58 in | Wt 191.0 lb

## 2012-10-15 DIAGNOSIS — Z113 Encounter for screening for infections with a predominantly sexual mode of transmission: Secondary | ICD-10-CM | POA: Insufficient documentation

## 2012-10-15 DIAGNOSIS — N898 Other specified noninflammatory disorders of vagina: Secondary | ICD-10-CM

## 2012-10-15 DIAGNOSIS — N3020 Other chronic cystitis without hematuria: Secondary | ICD-10-CM

## 2012-10-15 DIAGNOSIS — E669 Obesity, unspecified: Secondary | ICD-10-CM

## 2012-10-15 LAB — POCT UA - MICROSCOPIC ONLY

## 2012-10-15 LAB — POCT URINALYSIS DIPSTICK
Bilirubin, UA: NEGATIVE
Glucose, UA: NEGATIVE
Ketones, UA: NEGATIVE
Nitrite, UA: NEGATIVE
pH, UA: 7

## 2012-10-15 LAB — POCT WET PREP (WET MOUNT)

## 2012-10-15 NOTE — Progress Notes (Signed)
[facility]  Patient name: Dominique [name] MRN [id number]  Date of birth: 1975/11/12  CC & HPI:  Dominique [name] is a 37 y.o. female presenting today for concerns for weight loss and pelvic pain.   Pelvis pain: She reports ongoing pelvic pain and pain with urination for the past several months. It is difficult for her to localize the pain, but reports constant pain that can be made worse with urination. She reports frequent small voids. She has some nocturia but is able to sleep >6 hrs w/o voiding, and has large voids in the morning. She has been treated with Pyridium and Diflucan, but with some improvement but no resolution. She denies any sexual contact in the last several years due to her addiction recovery process, and denies any douches, new bath products, or others contact irritants. She says she was evaluated by a urologist 10 years ago, and told she had interstitial cystitis but has not followed up with them.   ROS:  Please See HPI above   Pertinent History Reviewed:  Medical & Surgical Hx:  Reviewed:  Medications & Allergies: Reviewed & Updated - see associated section  Social History: Reviewed:   reports that she has never smoked. She has never used smokeless tobacco.  History  Alcohol Use No   History  Drug Use No    Comment: former narcotic presription abuse    Objective Findings:  Vitals: BP 103/73  Pulse 82  Ht 4' 7.5" (1.41 m)  Wt 191 lb (86.637 kg)  BMI 43.58 kg/m2  Gen: NAD CV: RRR w/o m/r/g, pulses +2 b/l Resp: CTAB w/ normal respiratory effort GI: No skin changes; BS +; No tenderness or masses, No CVA tenderness Pelvic: Bimanual exam revealed mild Lt adnexal tenderness, Vaginal mucosa appeared pink, moist and healthy. No masses appreciated.    Assessment & Plan:   Please See Problem Focused Assessment & Plan

## 2012-10-15 NOTE — Assessment & Plan Note (Signed)
Discussed Weight loss strategies with patient - She requested Medications to assist with "starting weight loss" - Agreeable to Nutritional counseling: Given Referral  And advised to call to schedule apt

## 2012-10-15 NOTE — Addendum Note (Signed)
Addended by: Jennette Bill on: 10/15/2012 12:05 PM   Modules accepted: Orders
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
## 2012-10-15 NOTE — Patient Instructions (Addendum)
It was great seeing you today. Below is a list of the things we talked about:   1) We will refer you to urology today to be evaluated for bladder disfunction  2) We Will refer you to Nutrition to assist with weight loss   Please check-out at the front desk before leaving the clinic.  I look forward to talking with you again at our next visit. If you have any questions or concerns before then, please call the clinic at [phone].  See you soon,   Dr Wenda Low

## 2012-11-14 ENCOUNTER — Ambulatory Visit (INDEPENDENT_AMBULATORY_CARE_PROVIDER_SITE_OTHER): Payer: No Typology Code available for payment source | Admitting: Family Medicine

## 2012-11-14 ENCOUNTER — Encounter: Payer: Self-pay | Admitting: Family Medicine

## 2012-11-14 ENCOUNTER — Other Ambulatory Visit: Payer: Self-pay

## 2012-11-14 VITALS — Ht <= 58 in | Wt 188.4 lb

## 2012-11-14 DIAGNOSIS — E669 Obesity, unspecified: Secondary | ICD-10-CM

## 2012-11-14 NOTE — Patient Instructions (Addendum)
-   Eat at least 3 meals and 1-2 snacks per day.  Aim for no more than 5 hours between eating.  This will help you have energy all day, and to prevent a dangerous blood sugar drop.    - Eat breakfast at 9, lunch by 1, and a snack before work if you go in at 3; if you work at 5, try to have a bigger snack/small meal before work, and get food at work when you can.  - Eat a source of protein at least 3 times a day.  Protein foods include: Meat, fish, poultry, eggs, cheese, and beans such as pinto and kidney beans (beans also provide carbohydrate).  - Include vegetables with both lunch and dinner whenever you can.  Fresh or frozen veg's are best.   - Reduce your intake of soda, and increase water intake.  A good goal for now is 8 oz per day.  An ultimate goal is at least 48 oz of water per day.   - Taste preferences are learned.   - Write down everything you eat and drink each day for the next two weeks.  Start your food record again for at least one week before your next nutrition appt.  Bring your food record to follow-up.    - REVIEW YOUR FOOD RECORD AT LEAST 3-4 X WK, AND ASK YOURSELF IF YOU HAVE BEEN FOLLOWING RECOMMENDATIONS ABOVE AS WELL AS ARE YOU MAKING FOOD CHOICES YOU KNOW WILL HELP YOU LOSE WEIGHT.   - Planning ahead will be crucial.  Try to keep healthy foods on hand at home.
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
## 2012-11-26 ENCOUNTER — Ambulatory Visit (INDEPENDENT_AMBULATORY_CARE_PROVIDER_SITE_OTHER): Payer: No Typology Code available for payment source | Admitting: Family Medicine

## 2012-11-26 VITALS — BP 106/55 | HR 83 | Temp 99.3°F | Wt 187.0 lb

## 2012-11-26 DIAGNOSIS — B009 Herpesviral infection, unspecified: Secondary | ICD-10-CM

## 2012-11-26 DIAGNOSIS — B001 Herpesviral vesicular dermatitis: Secondary | ICD-10-CM

## 2012-11-26 MED ORDER — ACYCLOVIR 400 MG PO TABS: 400.0000 mg | ORAL_TABLET | Freq: Three times a day (TID) | ORAL | Status: DC

## 2012-11-26 MED ORDER — NAPROXEN 500 MG PO TABS: 500.0000 mg | ORAL_TABLET | Freq: Three times a day (TID) | ORAL | Status: DC

## 2012-11-26 NOTE — Progress Notes (Signed)
  Subjective:    Patient ID: Dominique [name], female    DOB: 06/06/1975, 37 y.o.   MRN: [id number]  HPI 37 yo F presents for same-day visit to discuss the following:  1. Cold sore: x 3 days. With R lateral tongue lesion. No fever. History of cold sores. No treatment. Previous drug abuses. Does not take narcotics.   Review of Systems As per HPI     Objective:   Physical Exam BP 106/55  Pulse 83  Temp(Src) 99.3 F (37.4 C) (Oral)  Wt 187 lb (84.823 kg) General appearance: alert, cooperative and no distress Throat: abnormal findings: herpetic lesion  on R lower lip and shallow ulcer on R lateral tongue.   Lungs: clear to auscultation bilaterally Heart: regular rate and rhythm, S1, S2 normal, no murmur, click, rub or gallop     Assessment & Plan:

## 2012-11-26 NOTE — Patient Instructions (Addendum)
Diala,  Thank you for coming in today. I sent your prescriptions to your pharmacy.  Please use cold compress. Take naproxen instead of ibuprofen for the next 5 days. Acyclovir.   F/u as needed if symptoms worsens or fail to resolve.  Dr. Armen Pickup

## 2012-11-27 NOTE — Assessment & Plan Note (Signed)
Lesions on lip and tongue.   Please use cold compress. Take naproxen instead of ibuprofen for the next 5 days. Acyclovir 400 mg PO TID x 5 days   F/u as needed if symptoms worsens or fail to resolve.

## 2012-12-25 ENCOUNTER — Ambulatory Visit: Payer: Self-pay | Admitting: Family Medicine

## 2012-12-27 ENCOUNTER — Encounter: Payer: Self-pay | Admitting: Family Medicine

## 2012-12-27 ENCOUNTER — Ambulatory Visit (INDEPENDENT_AMBULATORY_CARE_PROVIDER_SITE_OTHER): Payer: No Typology Code available for payment source | Admitting: Family Medicine

## 2012-12-27 VITALS — BP 119/81 | HR 93 | Temp 98.5°F | Wt 189.0 lb

## 2012-12-27 DIAGNOSIS — M7989 Other specified soft tissue disorders: Secondary | ICD-10-CM | POA: Insufficient documentation

## 2012-12-27 MED ORDER — TRIAMCINOLONE ACETONIDE 0.1 % EX OINT: 1.0000 "application " | TOPICAL_OINTMENT | Freq: Two times a day (BID) | CUTANEOUS | Status: DC

## 2012-12-27 MED ORDER — EUCERIN EX CREA: TOPICAL_CREAM | CUTANEOUS | Status: DC | PRN

## 2012-12-27 NOTE — Patient Instructions (Signed)
It was great seeing you today.   1. Apply creams to hands: Steroid cream, twice a day & Eucerin as needed 2. Stop using alcohol in hand soaks    Please bring all your medications to every doctors visit  Sign up for My Chart to have easy access to your labs results, and communication with your Primary care physician.  Next Appointment  With Dr Gayla Doss in 1 month   I look forward to talking with you again at our next visit. If you have any questions or concerns before then, please call the clinic at [phone].  Take Care,   Dr Wenda Low

## 2012-12-27 NOTE — Assessment & Plan Note (Signed)
Pertinent S&O  Bilateral intermittent hand swelling and erythema; no pain; previously seen by rheumatology (told it was not Raynauds disease) and vascular surgeons without diagnosis Assessment  Appears to be confined to the skin without joint or bone pain or swelling; unknown etiology; no signs of infection Plan  Trial of triamcinolone and Eucerin creams (May compound in the future if beneficial)  Stop soaking with alcohol  Consider dermatology referral

## 2012-12-27 NOTE — Progress Notes (Signed)
Subjective:     Patient ID: Dominique [name], female   DOB: 23-Sep-1975, 37 y.o.   MRN: [id number]  HPI Comments: She reports chronic (~ 2yrs) of intermittent hand swelling and erythema with most recent episode beginning Sunday. She denies any pain with the these episodes. 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  Denies any new medications or foods.  Occasionally her legs also swell; this occurred this past episode and resolved with ibuprofen. She reports being previously seen by rheumatology for evaluation of Raynauds phenomenon, but was told that she does not have this.  She has also been seen by vascular surgery and told that she has varicose veins in her lites, but no vascular abnormalities in her hands.  She has not been seen by dermatology.  She reports being tested for hepatitis B and C. While in prison and was negative for these. Does report that she has been followed by GI for "bleeding" and is still undergoing evaluation. She also endorses a lot of stress in her life, but reports this is no more than usual.  Hand Pain      Review of Systems  Constitutional: Negative for fever, chills, activity change, appetite change and fatigue.  Gastrointestinal: Positive for constipation and blood in stool.  Musculoskeletal: Positive for arthralgias, back pain and myalgias.  Skin: Positive for color change.       Objective:   Physical Exam  Constitutional: She appears well-developed and well-nourished.  Eyes: Conjunctivae are normal. Pupils are equal, round, and reactive to light.  Cardiovascular: Normal rate and regular rhythm.   Pulmonary/Chest: Effort normal and breath sounds normal.  Skin:  Right hand: mild edema and blanching erythema Both palmar and dorsal to the wrist. Left hand: mild edema and blanching erythema on digits 1-3   Assessment/Plan:      See Problem Focused Assessment & Plan

## 2013-01-03 ENCOUNTER — Emergency Department (HOSPITAL_COMMUNITY)
Admission: EM | Admit: 2013-01-03 | Discharge: 2013-01-04 | Disposition: A | Discharge status: Home / Self Care | Payer: No Typology Code available for payment source | Attending: Emergency Medicine | Admitting: Emergency Medicine

## 2013-01-03 ENCOUNTER — Encounter (HOSPITAL_COMMUNITY): Payer: Self-pay | Admitting: Emergency Medicine

## 2013-01-03 DIAGNOSIS — K219 Gastro-esophageal reflux disease without esophagitis: Secondary | ICD-10-CM | POA: Insufficient documentation

## 2013-01-03 DIAGNOSIS — Z791 Long term (current) use of non-steroidal anti-inflammatories (NSAID): Secondary | ICD-10-CM | POA: Insufficient documentation

## 2013-01-03 DIAGNOSIS — K644 Residual hemorrhoidal skin tags: Secondary | ICD-10-CM | POA: Insufficient documentation

## 2013-01-03 DIAGNOSIS — K625 Hemorrhage of anus and rectum: Secondary | ICD-10-CM

## 2013-01-03 DIAGNOSIS — Z79899 Other long term (current) drug therapy: Secondary | ICD-10-CM | POA: Insufficient documentation

## 2013-01-03 DIAGNOSIS — Z91040 Latex allergy status: Secondary | ICD-10-CM | POA: Insufficient documentation

## 2013-01-03 DIAGNOSIS — Z86718 Personal history of other venous thrombosis and embolism: Secondary | ICD-10-CM | POA: Insufficient documentation

## 2013-01-03 LAB — COMPREHENSIVE METABOLIC PANEL
ALT: 10 U/L (ref 0–35)
Alkaline Phosphatase: 87 U/L (ref 39–117)
BUN: 11 mg/dL (ref 6–23)
CO2: 26 mEq/L (ref 19–32)
GFR calc Af Amer: >90 mL/min (ref >90)
GFR calc non Af Amer: >90 mL/min (ref >90)
Glucose, Bld: 88 mg/dL (ref 70–99)
Potassium: 3.6 mEq/L (ref 3.5–5.1)
Sodium: 137 mEq/L (ref 135–145)
Total Bilirubin: <0.1 mg/dL — ABNORMAL LOW (ref 0.3–1.2)

## 2013-01-03 LAB — CBC WITH DIFFERENTIAL/PLATELET
Hemoglobin: 12.7 g/dL (ref 12.0–15.0)
Lymphocytes Relative: 39 % (ref 12–46)
Lymphs Abs: 3.4 10*3/uL (ref 0.7–4.0)
MCH: 31.5 pg (ref 26.0–34.0)
MCV: 95 fL (ref 78.0–100.0)
Monocytes Relative: 8 % (ref 3–12)
Neutrophils Relative %: 49 % (ref 43–77)
Platelets: 389 10*3/uL (ref 150–400)
RBC: 4.03 MIL/uL (ref 3.87–5.11)
WBC: 8.9 10*3/uL (ref 4.0–10.5)

## 2013-01-03 LAB — LIPASE, BLOOD: Lipase: 75 U/L — ABNORMAL HIGH (ref 11–59)

## 2013-01-03 NOTE — ED Notes (Signed)
Pt c/o rectal bleeding onset 2 days ago. Pt states she feels pressure on rectum with very little stool. Pt c/o low mid abd pain. Denies n/v

## 2013-01-04 NOTE — ED Provider Notes (Signed)
CSN: [id number]     Arrival date & time 01/03/13  1713 History   First MD Initiated Contact with Patient 01/04/13 0019     Chief Complaint  Patient presents with  . Rectal Bleeding   (Consider location/radiation/quality/duration/timing/severity/associated sxs/prior Treatment) HPI  37 year old female with history of Raynaud's disease, DVT, and GERD presents for evaluations of rectal bleeding. Patient states for the past 2-3 days she has had recurrent bright red blood per rectum. States 2 days ago she had multiple bouts of bowel movement and each time she noticed blood dripping into the toilet bowl. Unable to quantify the amount but states it was "a lot". She has about 5-10 bouts of loose stools on the first day.  She complains of low rectal pressure but no significant pain. She reports each time she has a bowel movement she only see very little stool.  She was afraid to eat today because she feels "queasy".  She denies having any significant headache, lightheadedness, dizziness, chest pain, shortness of breath, dysuria. She denies any recent trauma. Report having similar barrette blood per rectum the past year in which she was evaluated by Dr. Matthias Hughs.  States she had a endoscopy performed and was told that she has hemorrhoids.  Also has hx of anemia as well.  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  Denies alcohol abuse, rec drug use and is a non smoker.  No hx of cancer.  Not taking any blood thinner medication.    Past Medical History  Diagnosis Date  . Raynaud's disease   . DVT (deep venous thrombosis)   . Raynaud disease 12/21/2011  . Varicose veins   . GERD (gastroesophageal reflux disease)    Past Surgical History  Procedure Laterality Date  . Abdominal hysterectomy    . Cesarean section    . Appendectomy    . Hernia repair  2006  . Endovenous ablation saphenous vein w/ laser  02-01-2012    right greater saphenous vein and stab phlebectomy 10-20  incisions right leg  by Gretta Began, MD  . Endovenous ablation saphenous vein w/ laser Left 02-29-2012    left greater saphenous vein and stab phlebectomy left leg 10-20 incisions  by Gretta Began MD  . Esophagogastroduodenoscopy (egd) with propofol N/A 03/26/2012    Procedure: ESOPHAGOGASTRODUODENOSCOPY (EGD) WITH PROPOFOL;  Surgeon: Florencia Reasons, MD;  Location: [facility];  Service: Endoscopy;  Laterality: N/A;  . Colonoscopy with propofol N/A 03/26/2012    Procedure: COLONOSCOPY WITH PROPOFOL;  Surgeon: Florencia Reasons, MD;  Location: [facility];  Service: Endoscopy;  Laterality: N/A;   Family History  Problem Relation Age of Onset  . Diabetes Mother   . Heart disease Mother   . Hyperlipidemia Mother   . Hypertension Mother   . Other Mother     amputation, varicose veins  . Diabetes Father   . Hyperlipidemia Father   . Hypertension Father   . Peripheral vascular disease Father   . Other Father     varicose veins  . Deep vein thrombosis Brother   . Other Brother     varicose veins   History  Substance Use Topics  . Smoking status: Never Smoker   . Smokeless tobacco: Never Used  . Alcohol Use: No   OB History   Grav Para Term Preterm Abortions TAB SAB Ect Mult Living                 Review of  Systems  All other systems reviewed and are negative.    Allergies  Contrast media; Latex; Zofran; Eggs or egg-derived products; and Lactose intolerance (gi)  Home Medications   Current Outpatient Rx  Name  Route  Sig  Dispense  Refill  . acetaminophen (TYLENOL) 500 MG tablet   Oral   Take 1,000 mg by mouth every 6 (six) hours as needed. For pain.         Marland Kitchen acyclovir (ZOVIRAX) 400 MG tablet   Oral   Take 1 tablet (400 mg total) by mouth 3 (three) times daily.   15 tablet   0   . cetirizine (ZYRTEC) 10 MG tablet   Oral   Take 1 tablet (10 mg total) by mouth daily.   30 tablet   11   . CHOLECALCIFEROL PO   Oral   Take by mouth.         . ferrous sulfate  325 (65 FE) MG EC tablet   Oral   Take 325 mg by mouth every other day.          . ibuprofen (ADVIL,MOTRIN) 800 MG tablet   Oral   Take 800 mg by mouth every 8 (eight) hours as needed for pain.         . Multiple Vitamin (MULTIVITAMIN WITH MINERALS) TABS   Oral   Take 1 tablet by mouth daily.         . naproxen (NAPROSYN) 500 MG tablet   Oral   Take 1 tablet (500 mg total) by mouth 3 (three) times daily with meals.   30 tablet   0   . omeprazole (PRILOSEC) 20 MG capsule   Oral   Take 20 mg by mouth daily.         . Skin Protectants, Misc. (EUCERIN) cream   Topical   Apply topically as needed for dry skin (apply 2-3 times a day, every night and after shower/bath or dish washing).   454 g   0   . triamcinolone ointment (KENALOG) 0.1 %   Topical   Apply 1 application topically 2 (two) times daily. Apply to hands   90 g   0    BP 140/86  Pulse 74  Temp(Src) 99.1 F (37.3 C) (Oral)  Resp 16  Ht 4\' 7"  (1.397 m)  Wt 189 lb (85.73 kg)  BMI 43.93 kg/m2  SpO2 100% Physical Exam  Nursing note and vitals reviewed. Constitutional: She is oriented to person, place, and time. She appears well-developed and well-nourished. No distress.  Awake, alert, nontoxic appearance  HENT:  Head: Atraumatic.  Eyes: Conjunctivae are normal. Right eye exhibits no discharge. Left eye exhibits no discharge.  Neck: Neck supple.  Cardiovascular: Normal rate and regular rhythm.   Pulmonary/Chest: Effort normal. No respiratory distress. She exhibits no tenderness.  Abdominal: Soft. There is no tenderness. There is no rebound.  Genitourinary: Guaiac negative stool.  Chaperone present:  Rectal exam with non thrombosed external hemorrhoid, normal stool color, normal rectal tone.  No anal fissure and no melena.    Musculoskeletal: She exhibits no tenderness.  ROM appears intact, no obvious focal weakness  Neurological: She is alert and oriented to person, place, and time.  Mental status  and motor strength appears intact  Skin: No rash noted.  Psychiatric: She has a normal mood and affect.    ED Course  Procedures (including critical care time)  12:48 AM Pt with hx of BRBPR with recurrent rectal bleeding which is  improving.  abd soft and nontender.  No frank bleeding at this time.  She is hemodynamically stable.  No evidence of anemia, and fecal occult is negative.  Therefore, will have pt f/u with GI specialist for further management.  Otherwise pt is stable for discharge.  She agrees with plan.  Strict return precaution given.      Labs Review Labs Reviewed  COMPREHENSIVE METABOLIC PANEL - Abnormal; Notable for the following:    Albumin 3.3 (*)    Total Bilirubin <0.1 (*)    All other components within normal limits  LIPASE, BLOOD - Abnormal; Notable for the following:    Lipase 75 (*)    All other components within normal limits  CBC WITH DIFFERENTIAL  OCCULT BLOOD, POC DEVICE   Imaging Review No results found.  EKG Interpretation   None       MDM   1. Rectal bleeding   2. External hemorrhoid    BP 140/86  Pulse 74  Temp(Src) 99.1 F (37.3 C) (Oral)  Resp 16  Ht 4\' 7"  (1.397 m)  Wt 189 lb (85.73 kg)  BMI 43.93 kg/m2  SpO2 100%  I have reviewed nursing notes and vital signs. I reviewed available ER/hospitalization records thought the EMR     Dominique [name], [place] 01/04/13 1610

## 2013-01-04 NOTE — ED Notes (Addendum)
Pt presents with c/o rectal bleeding. Pt says she has a hx of same. Pt says she started having rectal bleeding 2 days ago consistently. Pt denies n/v.

## 2013-01-04 NOTE — ED Provider Notes (Signed)
Medical screening examination/treatment/procedure(s) were performed by non-physician practitioner and as supervising physician I was immediately available for consultation/collaboration.   Allahna Husband, MD 01/04/13 0749 

## 2013-01-15 ENCOUNTER — Telehealth: Payer: Self-pay | Admitting: Family Medicine

## 2013-01-15 NOTE — Telephone Encounter (Signed)
Pt called to tell Dr. Berkley Harvey that the cream he prescribed for her is not working and her employer wants to lay her off if she can not get this under control. She would like to know what she should do. jw

## 2013-01-16 ENCOUNTER — Other Ambulatory Visit: Payer: Self-pay | Admitting: Family Medicine

## 2013-01-16 ENCOUNTER — Ambulatory Visit: Payer: Self-pay | Admitting: Family Medicine

## 2013-01-16 ENCOUNTER — Encounter: Payer: Self-pay | Admitting: Family Medicine

## 2013-01-16 DIAGNOSIS — M7989 Other specified soft tissue disorders: Secondary | ICD-10-CM

## 2013-01-16 MED ORDER — PREDNISONE 20 MG PO TABS: 40.0000 mg | ORAL_TABLET | Freq: Every day | ORAL | Status: DC

## 2013-01-16 NOTE — Progress Notes (Signed)
Pt would like you to know that the redness is creeping down into her legs.  Also her boss is concerned at work because there are times when her hands are bleeding.  Please advise.  She is fine with making an appt just needs to know if you want her to see you or if you are fine with someone else. Jazmin Hartsell,CMA

## 2013-01-16 NOTE — Progress Notes (Signed)
Pt is aware of this. Dominique [name],CMA  

## 2013-01-16 NOTE — Progress Notes (Signed)
Please call patient advised her that I have placed a referral for dermatology for further evaluation of her hand swelling.  In the meantime, I have called in a prescription for prednisone, and have left a letter for her at our front desk stating that she is medically cleared for work.  Please have her make an appointment to be seen if her hand swelling and redness is progressing, she has new symptoms, or fevers.   Thanks,  [employer]

## 2013-02-03 ENCOUNTER — Ambulatory Visit: Payer: No Typology Code available for payment source | Admitting: Family Medicine

## 2013-02-17 ENCOUNTER — Ambulatory Visit: Payer: Self-pay | Admitting: Family Medicine

## 2013-03-24 ENCOUNTER — Emergency Department (HOSPITAL_COMMUNITY)
Admission: EM | Admit: 2013-03-24 | Discharge: 2013-03-24 | Disposition: A | Discharge status: Home / Self Care | Payer: No Typology Code available for payment source | Attending: Emergency Medicine | Admitting: Emergency Medicine

## 2013-03-24 ENCOUNTER — Encounter (HOSPITAL_COMMUNITY): Payer: Self-pay | Admitting: Emergency Medicine

## 2013-03-24 DIAGNOSIS — Z91040 Latex allergy status: Secondary | ICD-10-CM | POA: Insufficient documentation

## 2013-03-24 DIAGNOSIS — Z8679 Personal history of other diseases of the circulatory system: Secondary | ICD-10-CM | POA: Insufficient documentation

## 2013-03-24 DIAGNOSIS — K2990 Gastroduodenitis, unspecified, without bleeding: Principal | ICD-10-CM

## 2013-03-24 DIAGNOSIS — Z90710 Acquired absence of both cervix and uterus: Secondary | ICD-10-CM | POA: Insufficient documentation

## 2013-03-24 DIAGNOSIS — K219 Gastro-esophageal reflux disease without esophagitis: Secondary | ICD-10-CM | POA: Insufficient documentation

## 2013-03-24 DIAGNOSIS — Z86718 Personal history of other venous thrombosis and embolism: Secondary | ICD-10-CM | POA: Insufficient documentation

## 2013-03-24 DIAGNOSIS — Z9089 Acquired absence of other organs: Secondary | ICD-10-CM | POA: Insufficient documentation

## 2013-03-24 DIAGNOSIS — K859 Acute pancreatitis without necrosis or infection, unspecified: Secondary | ICD-10-CM | POA: Insufficient documentation

## 2013-03-24 DIAGNOSIS — K2970 Gastritis, unspecified, without bleeding: Secondary | ICD-10-CM | POA: Insufficient documentation

## 2013-03-24 DIAGNOSIS — Z79899 Other long term (current) drug therapy: Secondary | ICD-10-CM | POA: Insufficient documentation

## 2013-03-24 LAB — COMPREHENSIVE METABOLIC PANEL
ALT: 12 U/L (ref 0–35)
AST: 20 U/L (ref 0–37)
Albumin: 3 g/dL — ABNORMAL LOW (ref 3.5–5.2)
Alkaline Phosphatase: 93 U/L (ref 39–117)
BUN: 12 mg/dL (ref 6–23)
CHLORIDE: 102 meq/L (ref 96–112)
CO2: 23 mEq/L (ref 19–32)
CREATININE: 0.57 mg/dL (ref 0.50–1.10)
Calcium: 8.8 mg/dL (ref 8.4–10.5)
GFR calc Af Amer: >90 mL/min (ref >90)
GFR calc non Af Amer: >90 mL/min (ref >90)
GLUCOSE: 144 mg/dL — AB (ref 70–99)
POTASSIUM: 4.3 meq/L (ref 3.7–5.3)
Sodium: 140 mEq/L (ref 137–147)
Total Bilirubin: <0.2 mg/dL — ABNORMAL LOW (ref 0.3–1.2)
Total Protein: 6.4 g/dL (ref 6.0–8.3)

## 2013-03-24 LAB — CBC WITH DIFFERENTIAL/PLATELET
BASOS PCT: 0 % (ref 0–1)
Basophils Absolute: 0 10*3/uL (ref 0.0–0.1)
EOS ABS: 0 10*3/uL (ref 0.0–0.7)
Eosinophils Relative: 0 % (ref 0–5)
HCT: 38.2 % (ref 36.0–46.0)
HEMOGLOBIN: 13.1 g/dL (ref 12.0–15.0)
LYMPHS ABS: 1.1 10*3/uL (ref 0.7–4.0)
Lymphocytes Relative: 10 % — ABNORMAL LOW (ref 12–46)
MCH: 32.2 pg (ref 26.0–34.0)
MCHC: 34.3 g/dL (ref 30.0–36.0)
MCV: 93.9 fL (ref 78.0–100.0)
MONO ABS: 0.2 10*3/uL (ref 0.1–1.0)
Monocytes Relative: 2 % — ABNORMAL LOW (ref 3–12)
NEUTROS PCT: 88 % — AB (ref 43–77)
Neutro Abs: 8.9 10*3/uL — ABNORMAL HIGH (ref 1.7–7.7)
Platelets: 379 10*3/uL (ref 150–400)
RBC: 4.07 MIL/uL (ref 3.87–5.11)
RDW: 12.3 % (ref 11.5–15.5)
WBC: 10.1 10*3/uL (ref 4.0–10.5)

## 2013-03-24 LAB — I-STAT TROPONIN, ED: TROPONIN I, POC: 0 ng/mL (ref 0.00–0.08)

## 2013-03-24 LAB — LIPASE, BLOOD: LIPASE: 64 U/L — AB (ref 11–59)

## 2013-03-24 MED ORDER — PROMETHAZINE HCL 25 MG PO TABS: 25.0000 mg | ORAL_TABLET | Freq: Four times a day (QID) | ORAL | Status: DC | PRN

## 2013-03-24 MED ORDER — FAMOTIDINE IN NACL 20-0.9 MG/50ML-% IV SOLN
20.0000 mg | Freq: Once | INTRAVENOUS | Status: AC
  Administered 2013-03-24: 20 mg via INTRAVENOUS
  Filled 2013-03-24: qty 50

## 2013-03-24 MED ORDER — PROMETHAZINE HCL 25 MG/ML IJ SOLN
25.0000 mg | Freq: Once | INTRAMUSCULAR | Status: AC
  Administered 2013-03-24: 25 mg via INTRAVENOUS
  Filled 2013-03-24: qty 1

## 2013-03-24 MED ORDER — OMEPRAZOLE 20 MG PO CPDR: 20.0000 mg | DELAYED_RELEASE_CAPSULE | Freq: Every day | ORAL | Status: DC

## 2013-03-24 NOTE — ED Provider Notes (Signed)
CSN: [id number]     Arrival date & time 03/24/13  0254 History   First MD Initiated Contact with Patient 03/24/13 0431     Chief Complaint  Patient presents with  . Abdominal Pain     (Consider location/radiation/quality/duration/timing/severity/associated sxs/prior Treatment) HPI Comments: 38 y/o female, comes in to the ER with cc of abd pain. Has hx of Raynauds, GERD, Gastritis, Pancreatitis and Appendectomy. Reports, that since y'day, she has been having some epigastric abd pain with nausea, no emesis. Last BM was today - possibly some BRBPR, passing flatus. Pt describes the pain similar to her pancreatitis. The pain is non radiating. She does reports that she has some GERd like sx, with burning of her throat as well. Pt is recovering addict and trying to stay away from narcotics. Pt denies alcohol abuse. Finally, pt als reports having a GI doctor, and GI bleed in the past. She has had upper and lower endoscopy, with no yield on the source.   Patient is a 38 y.o. female presenting with abdominal pain. The history is provided by the patient.  Abdominal Pain Associated symptoms: nausea and vomiting   Associated symptoms: no chest pain, no dysuria and no shortness of breath     Past Medical History  Diagnosis Date  . Raynaud's disease   . DVT (deep venous thrombosis)   . Raynaud disease 12/21/2011  . Varicose veins   . GERD (gastroesophageal reflux disease)    Past Surgical History  Procedure Laterality Date  . Abdominal hysterectomy    . Cesarean section    . Appendectomy    . Hernia repair  2006  . Endovenous ablation saphenous vein w/ laser  02-01-2012    right greater saphenous vein and stab phlebectomy 10-20 incisions right leg  by Curt Jews, MD  . Endovenous ablation saphenous vein w/ laser Left 02-29-2012    left greater saphenous vein and stab phlebectomy left leg 10-20 incisions  by Curt Jews MD  . Esophagogastroduodenoscopy (egd) with propofol N/A 03/26/2012   Procedure: ESOPHAGOGASTRODUODENOSCOPY (EGD) WITH PROPOFOL;  Surgeon: Cleotis Nipper, MD;  Location: [facility];  Service: Endoscopy;  Laterality: N/A;  . Colonoscopy with propofol N/A 03/26/2012    Procedure: COLONOSCOPY WITH PROPOFOL;  Surgeon: Cleotis Nipper, MD;  Location: [facility];  Service: Endoscopy;  Laterality: N/A;   Family History  Problem Relation Age of Onset  . Diabetes Mother   . Heart disease Mother   . Hyperlipidemia Mother   . Hypertension Mother   . Other Mother     amputation, varicose veins  . Diabetes Father   . Hyperlipidemia Father   . Hypertension Father   . Peripheral vascular disease Father   . Other Father     varicose veins  . Deep vein thrombosis Brother   . Other Brother     varicose veins   History  Substance Use Topics  . Smoking status: Never Smoker   . Smokeless tobacco: Never Used  . Alcohol Use: No   OB History   Grav Para Term Preterm Abortions TAB SAB Ect Mult Living                 Review of Systems  Constitutional: Positive for activity change.  Respiratory: Negative for shortness of breath.   Cardiovascular: Negative for chest pain.  Gastrointestinal: Positive for nausea, vomiting, abdominal pain and blood in stool.  Genitourinary: Negative for dysuria.  Musculoskeletal: Negative for neck pain.  Neurological: Negative for headaches.  Hematological:  Does not bruise/bleed easily.  All other systems reviewed and are negative.      Allergies  Contrast media; Latex; Zofran; Eggs or egg-derived products; and Lactose intolerance (gi)  Home Medications   Current Outpatient Rx  Name  Route  Sig  Dispense  Refill  . acetaminophen (TYLENOL) 500 MG tablet   Oral   Take 1,000 mg by mouth every 6 (six) hours as needed. For pain.         Marland Kitchen esomeprazole (NEXIUM) 40 MG capsule   Oral   Take 40 mg by mouth daily at 12 noon.         . Multiple Vitamin (MULTIVITAMIN WITH MINERALS) TABS   Oral   Take 1 tablet by mouth  daily.         Marland Kitchen omeprazole (PRILOSEC) 20 MG capsule   Oral   Take 1 capsule (20 mg total) by mouth daily.   30 capsule   0   . promethazine (PHENERGAN) 25 MG tablet   Oral   Take 1 tablet (25 mg total) by mouth every 6 (six) hours as needed for nausea.   30 tablet   0    BP 111/70  Pulse 76  Temp(Src) 98.5 F (36.9 C) (Oral)  Resp 16  SpO2 97% Physical Exam  Constitutional: She is oriented to person, place, and time. She appears well-developed and well-nourished.  HENT:  Head: Normocephalic and atraumatic.  Eyes: EOM are normal. Pupils are equal, round, and reactive to light.  Neck: Neck supple.  Cardiovascular: Normal rate, regular rhythm and normal heart sounds.   No murmur heard. Pulmonary/Chest: Effort normal. No respiratory distress.  Abdominal: Soft. She exhibits no distension. There is tenderness. There is no rebound and no guarding.  Epigastric tenderness only, oral exam is normal.  Neurological: She is alert and oriented to person, place, and time.  Skin: Skin is warm and dry.    ED Course  Procedures (including critical care time) Labs Review Labs Reviewed  CBC WITH DIFFERENTIAL - Abnormal; Notable for the following:    Neutrophils Relative % 88 (*)    Neutro Abs 8.9 (*)    Lymphocytes Relative 10 (*)    Monocytes Relative 2 (*)    All other components within normal limits  COMPREHENSIVE METABOLIC PANEL - Abnormal; Notable for the following:    Glucose, Bld 144 (*)    Albumin 3.0 (*)    Total Bilirubin <0.2 (*)    All other components within normal limits  LIPASE, BLOOD - Abnormal; Notable for the following:    Lipase 64 (*)    All other components within normal limits  URINALYSIS, ROUTINE W REFLEX MICROSCOPIC  I-STAT TROPOININ, ED   Imaging Review No results found.   EKG Interpretation None      MDM   Final diagnoses:  Gastritis  Pancreatitis    Pt comes in with cc of abd pain. Hx of pancreatitis, gastritis - and reports similar  pain. + GI bleed as well - but it appears that she has had this in the past with no yield.  Based on the hx and exam findings, and slightly elevated lipase - i think she is having gastritis/esiphagitis and may be pancreatitis. She tolerated po well in the ER, and we feel she is stable for d.c  I called her mobile # and her work phone to verbalize return precautions - and ended up leaving a Voice mail at 8:30 am.  Varney Biles, MD 03/27/13 [phone]

## 2013-03-24 NOTE — ED Notes (Signed)
Pt. reports upper abdominal pain with slight nausea and diarrhea onset yesterday , pt. also reported sore throat " burning " .

## 2013-03-24 NOTE — ED Notes (Signed)
Patient reports she had to void before calling for staff.  No sample collected.  Reinforced the need for urine sample, and reviewed plan of care.

## 2013-03-24 NOTE — ED Notes (Signed)
Discussed the need to void for a urine sample. Patient states she already went to void.

## 2013-03-24 NOTE — ED Notes (Signed)
2 missed attempts to start IV. Notified phlebotomist for lab draws.

## 2013-03-24 NOTE — ED Notes (Signed)
IV team paged.  

## 2013-03-24 NOTE — Discharge Instructions (Signed)
Acute Pancreatitis Acute pancreatitis is a disease in which the pancreas becomes suddenly inflamed. The pancreas is a large gland located behind your stomach. The pancreas produces enzymes that help digest food. The pancreas also releases the hormones glucagon and insulin that help regulate blood sugar. Damage to the pancreas occurs when the digestive enzymes from the pancreas are activated and begin attacking the pancreas before being released into the intestine. Most acute attacks last a couple of days and can cause serious complications. Some people become dehydrated and develop low blood pressure. In severe cases, bleeding into the pancreas can lead to shock and can be life-threatening. The lungs, heart, and kidneys may fail. CAUSES  Pancreatitis can happen to anyone. In some cases, the cause is unknown. Most cases are caused by:  Alcohol abuse.  Gallstones. Other less common causes are:  Certain medicines.  Exposure to certain chemicals.  Infection.  Damage caused by an accident (trauma).  Abdominal surgery. SYMPTOMS   Pain in the upper abdomen that may radiate to the back.  Tenderness and swelling of the abdomen.  Nausea and vomiting. DIAGNOSIS  Your caregiver will perform a physical exam. Blood and stool tests may be done to confirm the diagnosis. Imaging tests may also be done, such as X-rays, CT scans, or an ultrasound of the abdomen. TREATMENT  Treatment usually requires a stay in the hospital. Treatment may include:  Pain medicine.  Fluid replacement through an intravenous line (IV).  Placing a tube in the stomach to remove stomach contents and control vomiting.  Not eating for 3 or 4 days. This gives your pancreas a rest, because enzymes are not being produced that can cause further damage.  Antibiotic medicines if your condition is caused by an infection.  Surgery of the pancreas or gallbladder. HOME CARE INSTRUCTIONS   Follow the diet advised by your  caregiver. This may involve avoiding alcohol and decreasing the amount of fat in your diet.  Eat smaller, more frequent meals. This reduces the amount of digestive juices the pancreas produces.  Drink enough fluids to keep your urine clear or pale yellow.  Only take over-the-counter or prescription medicines as directed by your caregiver.  Avoid drinking alcohol if it caused your condition.  Do not smoke.  Get plenty of rest.  Check your blood sugar at home as directed by your caregiver.  Keep all follow-up appointments as directed by your caregiver. SEEK MEDICAL CARE IF:   You do not recover as quickly as expected.  You develop new or worsening symptoms.  You have persistent pain, weakness, or nausea.  You recover and then have another episode of pain. SEEK IMMEDIATE MEDICAL CARE IF:   You are unable to eat or keep fluids down.  Your pain becomes severe.  You have a fever or persistent symptoms for more than 2 to 3 days.  You have a fever and your symptoms suddenly get worse.  Your skin or the white part of your eyes turn yellow (jaundice).  You develop vomiting.  You feel dizzy, or you faint.  Your blood sugar is high (over 300 mg/dL). MAKE SURE YOU:   Understand these instructions.  Will watch your condition.  Will get help right away if you are not doing well or get worse. Document Released: 12/26/2004 Document Revised: 06/27/2011 Document Reviewed: 04/06/2011 [facility] Patient Information 2014 [place].  Gastritis, Adult Gastritis is soreness and swelling (inflammation) of the lining of the stomach. Gastritis can develop as a sudden onset (acute) or  long-term (chronic) condition. If gastritis is not treated, it can lead to stomach bleeding and ulcers. CAUSES  Gastritis occurs when the stomach lining is weak or damaged. Digestive juices from the stomach then inflame the weakened stomach lining. The stomach lining may be weak or damaged due to viral or  bacterial infections. One common bacterial infection is the Helicobacter pylori infection. Gastritis can also result from excessive alcohol consumption, taking certain medicines, or having too much acid in the stomach.  SYMPTOMS  In some cases, there are no symptoms. When symptoms are present, they may include:  Pain or a burning sensation in the upper abdomen.  Nausea.  Vomiting.  An uncomfortable feeling of fullness after eating. DIAGNOSIS  Your caregiver may suspect you have gastritis based on your symptoms and a physical exam. To determine the cause of your gastritis, your caregiver may perform the following:  Blood or stool tests to check for the H pylori bacterium.  Gastroscopy. A thin, flexible tube (endoscope) is passed down the esophagus and into the stomach. The endoscope has a light and camera on the end. Your caregiver uses the endoscope to view the inside of the stomach.  Taking a tissue sample (biopsy) from the stomach to examine under a microscope. TREATMENT  Depending on the cause of your gastritis, medicines may be prescribed. If you have a bacterial infection, such as an H pylori infection, antibiotics may be given. If your gastritis is caused by too much acid in the stomach, H2 blockers or antacids may be given. Your caregiver may recommend that you stop taking aspirin, ibuprofen, or other nonsteroidal anti-inflammatory drugs (NSAIDs). HOME CARE INSTRUCTIONS  Only take over-the-counter or prescription medicines as directed by your caregiver.  If you were given antibiotic medicines, take them as directed. Finish them even if you start to feel better.  Drink enough fluids to keep your urine clear or pale yellow.  Avoid foods and drinks that make your symptoms worse, such as:  Caffeine or alcoholic drinks.  Chocolate.  Peppermint or mint flavorings.  Garlic and onions.  Spicy foods.  Citrus fruits, such as oranges, lemons, or limes.  Tomato-based foods such  as sauce, chili, salsa, and pizza.  Fried and fatty foods.  Eat small, frequent meals instead of large meals. SEEK IMMEDIATE MEDICAL CARE IF:   You have black or dark red stools.  You vomit blood or material that looks like coffee grounds.  You are unable to keep fluids down.  Your abdominal pain gets worse.  You have a fever.  You do not feel better after 1 week.  You have any other questions or concerns. MAKE SURE YOU:  Understand these instructions.  Will watch your condition.  Will get help right away if you are not doing well or get worse. Document Released: 12/20/2000 Document Revised: 06/27/2011 Document Reviewed: 02/08/2011 [facility] Patient Information 2014 [place].

## 2013-03-27 ENCOUNTER — Ambulatory Visit (INDEPENDENT_AMBULATORY_CARE_PROVIDER_SITE_OTHER): Payer: No Typology Code available for payment source | Admitting: Family Medicine

## 2013-03-27 ENCOUNTER — Encounter: Payer: Self-pay | Admitting: Family Medicine

## 2013-03-27 VITALS — BP 147/85 | HR 84 | Ht <= 58 in | Wt 196.0 lb

## 2013-03-27 DIAGNOSIS — K648 Other hemorrhoids: Secondary | ICD-10-CM | POA: Insufficient documentation

## 2013-03-27 DIAGNOSIS — K2970 Gastritis, unspecified, without bleeding: Secondary | ICD-10-CM

## 2013-03-27 DIAGNOSIS — M25579 Pain in unspecified ankle and joints of unspecified foot: Secondary | ICD-10-CM

## 2013-03-27 DIAGNOSIS — K2990 Gastroduodenitis, unspecified, without bleeding: Secondary | ICD-10-CM

## 2013-03-27 DIAGNOSIS — K625 Hemorrhage of anus and rectum: Secondary | ICD-10-CM

## 2013-03-27 LAB — POCT HEMOGLOBIN: Hemoglobin: 11.2 g/dL — AB (ref 12.2–16.2)

## 2013-03-27 MED ORDER — ACETAMINOPHEN ER 650 MG PO TBCR: 650.0000 mg | EXTENDED_RELEASE_TABLET | Freq: Three times a day (TID) | ORAL | Status: DC

## 2013-03-27 MED ORDER — POLYETHYLENE GLYCOL 3350 17 GM/SCOOP PO POWD: 17.0000 g | Freq: Two times a day (BID) | ORAL | Status: DC | PRN

## 2013-03-27 NOTE — Patient Instructions (Signed)
For the hemorrhoids, continue sitz baths. If you have worsening rectal bleeding, return to see Dr. Wallis Mart.   Take 2 nexium.   Take tylenol three times a day for 5 days.

## 2013-03-27 NOTE — Assessment & Plan Note (Signed)
Pain improved.  Avoid NSAIDs Take nexium 40mg  bid instead of qd for the next few days.

## 2013-03-27 NOTE — Progress Notes (Signed)
Patient ID: Dominique [name]    DOB: 08-Apr-1975, 38 y.o.   MRN: [id number] --- Subjective:  Tinia is a 38 y.o.female with h/o hemorrhoids, GERD, pancreatitis who presents with rectal bleeding and abdominal pain.  Patient was seen on 03/24/13 for abdominal pain and nausea. She was found to have a mildly elevated lipase. She was discharged with phenergan. 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  Abdominal pain and nausea have improved. THe abdominal pain is located in the epigastric and left upper quadrant region. She has had some diarrhea.  She reports having bright red blood per rectum worst on Tuesday and subsequently every time she had bowel movement. BM's were watery. Rectal bleeding stopped yesterday. Denies light headedness.  She has been taking 6 OTC ibuprofen per day. She denies melanotic stools.   She had upper and lower scopes on 03/26/12 by Dr. Cristina Gong. Colonoscopy was normal and EGD showed antral gastritis.   ROS: see HPI Past Medical History: reviewed and updated medications and allergies. Social History: Tobacco:  Objective: Filed Vitals:   03/27/13 1603  BP: 147/85  Pulse: 84    Physical Examination:   General appearance - alert, well appearing, and in no distress Abdomen - soft, non tender, non distended, no rebound, no guarding.  Rectal - no fissure, tags around anus, anoscope exam showing inflamed and easily bleeding mucosa, possibly internal hemorrhoid.

## 2013-03-27 NOTE — Assessment & Plan Note (Signed)
Normal EGD and colonoscopy in March 2014.  LIkely from hemorrhoids.  Sitz baths miralax for regular soft stools If worsening or recurrent bleeding, follow up with Dr. Cristina Gong.  poct Hg in clinic at 11.2. Baseline is between 11.3 and 13. Asymptomatic and will therefore not further workup anemia at this time unless she has recurrence of bleeding

## 2013-03-27 NOTE — Assessment & Plan Note (Signed)
Tylenol 650mg  tid scheduled for 4-5 days then as needed.  LFT's normal.

## 2013-04-30 ENCOUNTER — Ambulatory Visit (INDEPENDENT_AMBULATORY_CARE_PROVIDER_SITE_OTHER): Payer: No Typology Code available for payment source | Admitting: Family Medicine

## 2013-04-30 ENCOUNTER — Encounter: Payer: Self-pay | Admitting: Family Medicine

## 2013-04-30 VITALS — BP 113/79 | HR 92 | Temp 98.5°F | Wt 191.0 lb

## 2013-04-30 DIAGNOSIS — B001 Herpesviral vesicular dermatitis: Secondary | ICD-10-CM

## 2013-04-30 DIAGNOSIS — B009 Herpesviral infection, unspecified: Secondary | ICD-10-CM

## 2013-04-30 MED ORDER — ACYCLOVIR 400 MG PO TABS: 400.0000 mg | ORAL_TABLET | Freq: Three times a day (TID) | ORAL | Status: DC

## 2013-04-30 NOTE — Patient Instructions (Signed)
Take medication three times daily for the next 5 days. Hold on to the extra medication. If you start to feel the tingle, start the medicine again.  Let us know if you do not get better or if you need anything.  Amber M. Hairford, M.D.

## 2013-04-30 NOTE — Assessment & Plan Note (Signed)
A: Recurrent herpes outbreak.  P: - Acyclovir TID x5 days - Given Rx with #60 in the bottle, instructed to start another round of treatment at first sign of tingling. - Return if lesions worsen, she develops fevers or fails to improve.

## 2013-04-30 NOTE — Progress Notes (Signed)
Patient ID: Dominique [name], female   DOB: [birth year], 38 y.o.   MRN: [id number]    Subjective: HPI: Patient is a 38 y.o. female presenting to clinic today for same day appointment for herpes outbreak.  Herpes labialis- Has longstanding history of fever blisters. Had severe outbreak in November. Treated with Acyclovir and fully resolved. She reports a lot of stress lately with death of both parents, and she has also been sick recently with pancreatitis and influenza. Does not smoke, does not use drugs. No new partners. No fevers.  History Reviewed: Non smoker. ROS: Please see HPI above.  Objective: Office vital signs reviewed. BP 113/79  Pulse 92  Temp(Src) 98.5 F (36.9 C) (Oral)  Wt 191 lb (86.637 kg)  Physical Examination:  General: Awake, alert. NAD HEENT: Atraumatic, normocephalic. MMM. Large cluster of clear vesicles on left lower lip, one red lesion noted on right lower lip. Erythematous, shiny lesion on tip of tongue. 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

## 2013-05-05 ENCOUNTER — Telehealth: Payer: Self-pay | Admitting: Family Medicine

## 2013-05-05 NOTE — Telephone Encounter (Signed)
Would like to have phenergan refilled. Has been taking to valtrex and has one dose left. The medicine makes her nauseated. When she takes  Phenergan, it takes care of the nausea.  Also wants to know what dr Berkley Harvey is going to do about her hands Please advise

## 2013-05-06 ENCOUNTER — Emergency Department (HOSPITAL_COMMUNITY)
Admission: EM | Admit: 2013-05-06 | Discharge: 2013-05-06 | Discharge status: Against Medical Advice | Payer: No Typology Code available for payment source | Attending: Emergency Medicine | Admitting: Emergency Medicine

## 2013-05-06 ENCOUNTER — Other Ambulatory Visit: Payer: Self-pay | Admitting: Family Medicine

## 2013-05-06 ENCOUNTER — Encounter (HOSPITAL_COMMUNITY): Payer: Self-pay | Admitting: Emergency Medicine

## 2013-05-06 DIAGNOSIS — R51 Headache: Secondary | ICD-10-CM | POA: Insufficient documentation

## 2013-05-06 DIAGNOSIS — K219 Gastro-esophageal reflux disease without esophagitis: Secondary | ICD-10-CM | POA: Insufficient documentation

## 2013-05-06 DIAGNOSIS — Z91040 Latex allergy status: Secondary | ICD-10-CM | POA: Insufficient documentation

## 2013-05-06 DIAGNOSIS — Z8679 Personal history of other diseases of the circulatory system: Secondary | ICD-10-CM | POA: Insufficient documentation

## 2013-05-06 DIAGNOSIS — R110 Nausea: Secondary | ICD-10-CM | POA: Insufficient documentation

## 2013-05-06 DIAGNOSIS — Z86718 Personal history of other venous thrombosis and embolism: Secondary | ICD-10-CM | POA: Insufficient documentation

## 2013-05-06 DIAGNOSIS — Z79899 Other long term (current) drug therapy: Secondary | ICD-10-CM | POA: Insufficient documentation

## 2013-05-06 MED ORDER — PROMETHAZINE HCL 25 MG PO TABS: 25.0000 mg | ORAL_TABLET | Freq: Four times a day (QID) | ORAL | Status: DC | PRN

## 2013-05-06 NOTE — Telephone Encounter (Signed)
Pt called back and stated she is having a bad headache; not sure if it is coming from her Valtrex.  She would like her phenergan refilled because she also has complaints of nausea.  She said if she could get the phenergan filled, it would help.  Derl Barrow, RN

## 2013-05-06 NOTE — Telephone Encounter (Signed)
Left voice message regarding Dr. Darnelle Going note.  See note below. Derl Barrow, RN

## 2013-05-06 NOTE — Telephone Encounter (Signed)
Phenergan sent to [facility]. Acyclovir can cause headaches sometimes. However, IF she is having fevers, stiff neck, changes in vision or headaches do not resolve after she finishes her medication, she should be seen in clinic or ED since her fever blisters could also cause more serious problems.  Thanks, [employer]. Rayona Sardinha, M.D.

## 2013-05-06 NOTE — ED Notes (Signed)
Pt LWBS after triage.

## 2013-05-06 NOTE — ED Notes (Addendum)
Reports having recent outbreak of cold sores and was started on acyclovir. Now having headaches, chills and nausea x 2 days.

## 2013-05-07 ENCOUNTER — Encounter: Payer: Self-pay | Admitting: Family Medicine

## 2013-05-07 ENCOUNTER — Ambulatory Visit (INDEPENDENT_AMBULATORY_CARE_PROVIDER_SITE_OTHER): Payer: No Typology Code available for payment source | Admitting: Family Medicine

## 2013-05-07 VITALS — BP 112/78 | HR 163 | Temp 97.4°F | Ht <= 58 in | Wt 197.6 lb

## 2013-05-07 DIAGNOSIS — R51 Headache: Secondary | ICD-10-CM

## 2013-05-07 DIAGNOSIS — R519 Headache, unspecified: Secondary | ICD-10-CM | POA: Insufficient documentation

## 2013-05-07 DIAGNOSIS — B001 Herpesviral vesicular dermatitis: Secondary | ICD-10-CM

## 2013-05-07 DIAGNOSIS — B009 Herpesviral infection, unspecified: Secondary | ICD-10-CM

## 2013-05-07 MED ORDER — VALACYCLOVIR HCL 1 G PO TABS: 2000.0000 mg | ORAL_TABLET | Freq: Two times a day (BID) | ORAL | Status: DC

## 2013-05-07 NOTE — Progress Notes (Signed)
   Subjective:    Patient ID: Dominique [name], female    DOB: March 26, 1975, 38 y.o.   MRN: [id number]  HPI 38 year old female presents for followup. Current concerns:  1) Herpes Labialis - Patient was seen for this on 4/22. - She was given acyclovir at that time.  - Patient reports that she has had some improvement but continues to have lesions.   2) Headache - Patient reports that after starting the medication she began to have headache. - Headache is located frontally (bilaterally) and currently 10 out of 10 in severity. - Associated symptoms: Mild photophobia. - Patient has had some intermittent nausea.  Review of Systems Per HPI    Objective:   Physical Exam Filed Vitals:   05/07/13 0900  BP: 112/78  Pulse: 163  Temp: 97.4 F (36.3 C)   Exam: General: well appearing female in no acute distress. HEENT: Oropharynx clear.  No sinus tenderness on exam. Neck: No nuchal rigidity noted.  Cardiovascular: RRR. No murmurs, rubs, or gallops. Respiratory: CTAB. No rales, rhonchi, or wheeze. Neuro: Alert and oriented x3.  No focal deficits on examination.   Assessment & Plan:  See Problem List

## 2013-05-07 NOTE — Assessment & Plan Note (Signed)
Given lack of significant improvement patient requested additional medication for this. I advised her to discontinue acyclovir. Will treat with valacyclovir 2 g twice a day x1 day.

## 2013-05-07 NOTE — Patient Instructions (Signed)
Stop taking the acyclovir.  Use Ibuprofen for your headache (800 mg three times daily).  Follow up with your PCP as indicated.

## 2013-05-07 NOTE — Assessment & Plan Note (Signed)
Occurred after starting Acyclovir (headache is a known side effect of this medication).  Is located frontally does not appear to be consistent with migraine headache given history. I advised patient to discontinue acyclovir and use ibuprofen as needed for headache.  No other medication was given as patient has a history of narcotic abuse.

## 2013-05-08 NOTE — ED Provider Notes (Signed)
CSN: [id number]     Arrival date & time 05/06/13  1524 History   None    Chief Complaint  Patient presents with  . Headache  . Nausea     (Consider location/radiation/quality/duration/timing/severity/associated sxs/prior Treatment) HPI  LWBS   Past Medical History  Diagnosis Date  . Raynaud's disease   . DVT (deep venous thrombosis)   . Raynaud disease 12/21/2011  . Varicose veins   . GERD (gastroesophageal reflux disease)    Past Surgical History  Procedure Laterality Date  . Abdominal hysterectomy    . Cesarean section    . Appendectomy    . Hernia repair  2006  . Endovenous ablation saphenous vein w/ laser  02-01-2012    right greater saphenous vein and stab phlebectomy 10-20 incisions right leg  by Curt Jews, MD  . Endovenous ablation saphenous vein w/ laser Left 02-29-2012    left greater saphenous vein and stab phlebectomy left leg 10-20 incisions  by Curt Jews MD  . Esophagogastroduodenoscopy (egd) with propofol N/A 03/26/2012    Procedure: ESOPHAGOGASTRODUODENOSCOPY (EGD) WITH PROPOFOL;  Surgeon: Cleotis Nipper, MD;  Location: [facility] END[facility];  Service: Endoscopy;  Laterality: N/A;  . Colonoscopy with propofol N/A 03/26/2012    Procedure: COLONOSCOPY WITH PROPOFOL;  Surgeon: Cleotis Nipper, MD;  Location: [facility] END[facility];  Service: Endoscopy;  Laterality: N/A;   Family History  Problem Relation Age of Onset  . Diabetes Mother   . Heart disease Mother   . Hyperlipidemia Mother   . Hypertension Mother   . Other Mother     amputation, varicose veins  . Diabetes Father   . Hyperlipidemia Father   . Hypertension Father   . Peripheral vascular disease Father   . Other Father     varicose veins  . Deep vein thrombosis Brother   . Other Brother     varicose veins   History  Substance Use Topics  . Smoking status: Never Smoker   . Smokeless tobacco: Never Used  . Alcohol Use: No   OB History   Grav Para Term Preterm Abortions TAB SAB Ect Mult Living            Review of Systems    Allergies  Contrast media; Latex; Zofran; Eggs or egg-derived products; and Lactose intolerance (gi)  Home Medications   Prior to Admission medications   Medication Sig Start Date End Date Taking? Authorizing Provider  acetaminophen (TYLENOL ARTHRITIS PAIN) 650 MG CR tablet Take 1 tablet (650 mg total) by mouth 3 (three) times daily. Do not exceed 3000mg  03/27/13  Yes Kandis Nab, MD  acyclovir (ZOVIRAX) 400 MG tablet Take 1 tablet (400 mg total) by mouth 3 (three) times daily. 04/30/13  Yes Amber Fidel Levy, MD  esomeprazole (NEXIUM) 40 MG capsule Take 40 mg by mouth daily at 12 noon.   Yes Historical Provider, MD  Multiple Vitamin (MULTIVITAMIN WITH MINERALS) TABS Take 1 tablet by mouth daily.   Yes Historical Provider, MD  polyethylene glycol powder (GLYCOLAX/MIRALAX) powder Take 17 g by mouth 2 (two) times daily as needed. 03/27/13  Yes Kandis Nab, MD  promethazine (PHENERGAN) 25 MG tablet Take 1 tablet (25 mg total) by mouth every 6 (six) hours as needed for nausea. 05/06/13   Amber Fidel Levy, MD  valACYclovir (VALTREX) 1000 MG tablet Take 2 tablets (2,000 mg total) by mouth 2 (two) times daily. 05/07/13   Jayce G Cook, DO   BP 126/92  Pulse 87  Temp(Src) 99.2  F (37.3 C) (Oral)  Resp 18  Ht 4\' 7"  (1.397 m)  Wt 196 lb 12.8 oz (89.268 kg)  BMI 45.74 kg/m2  SpO2 97% Physical Exam  ED Course  Procedures (including critical care time) Labs Review Labs Reviewed - No data to display  Imaging Review No results found.   EKG Interpretation None      MDM   Final diagnoses:  None    LWBS     Monico Blitz, PA-C 05/08/13 1953

## 2013-05-08 NOTE — ED Provider Notes (Signed)
Medical screening examination/treatment/procedure(s) were performed by non-physician practitioner and as supervising physician I was immediately available for consultation/collaboration.  Aarionna Germer T Hanifah Royse, MD 05/08/13 2322 

## 2013-05-11 ENCOUNTER — Telehealth: Payer: Self-pay | Admitting: Family Medicine

## 2013-05-11 ENCOUNTER — Emergency Department (HOSPITAL_COMMUNITY)
Admission: EM | Admit: 2013-05-11 | Discharge: 2013-05-11 | Discharge status: Against Medical Advice | Payer: No Typology Code available for payment source | Attending: Emergency Medicine | Admitting: Emergency Medicine

## 2013-05-11 ENCOUNTER — Encounter (HOSPITAL_COMMUNITY): Payer: Self-pay | Admitting: Emergency Medicine

## 2013-05-11 DIAGNOSIS — M7989 Other specified soft tissue disorders: Secondary | ICD-10-CM | POA: Insufficient documentation

## 2013-05-11 NOTE — ED Notes (Signed)
PT called to for room @ 5:10 with no answer.

## 2013-05-11 NOTE — ED Notes (Signed)
Called numerus times.  Not anywhere she told no on that she was leaving

## 2013-05-11 NOTE — ED Notes (Signed)
The pt is c/o lt lower leg redness with swelling and pain for 2-3 days.  History of the same

## 2013-05-11 NOTE — Telephone Encounter (Signed)
Pt called after hours line stating her left leg was erythematous, edematous and tender.  She had been working all day on her feet and was unsure if she should have this examined.  Her right leg was slightly edematous as well but not to the point that the L was.  Denied SOB, CP, pleuritic inspiratory pain.  Recommended evaluation in ED for DVT w/u or cellulitis.  Tamela Oddi Awanda Mink, DO of [facility] 05/11/2013, 6:40 AM

## 2013-05-12 ENCOUNTER — Emergency Department (HOSPITAL_COMMUNITY)
Admission: EM | Admit: 2013-05-12 | Discharge: 2013-05-12 | Disposition: A | Discharge status: Home / Self Care | Payer: No Typology Code available for payment source | Source: Home / Self Care | Attending: Family Medicine | Admitting: Family Medicine

## 2013-05-12 ENCOUNTER — Encounter (HOSPITAL_COMMUNITY): Payer: Self-pay | Admitting: Emergency Medicine

## 2013-05-12 DIAGNOSIS — L0390 Cellulitis, unspecified: Secondary | ICD-10-CM

## 2013-05-12 DIAGNOSIS — L0291 Cutaneous abscess, unspecified: Secondary | ICD-10-CM

## 2013-05-12 DIAGNOSIS — I809 Phlebitis and thrombophlebitis of unspecified site: Secondary | ICD-10-CM

## 2013-05-12 MED ORDER — MELOXICAM 15 MG PO TABS: 15.0000 mg | ORAL_TABLET | Freq: Every day | ORAL | Status: DC

## 2013-05-12 MED ORDER — KETOROLAC TROMETHAMINE 60 MG/2ML IM SOLN
INTRAMUSCULAR | Status: AC
  Filled 2013-05-12: qty 2

## 2013-05-12 MED ORDER — KETOROLAC TROMETHAMINE 60 MG/2ML IM SOLN
60.0000 mg | Freq: Once | INTRAMUSCULAR | Status: AC
  Administered 2013-05-12: 60 mg via INTRAMUSCULAR

## 2013-05-12 MED ORDER — CEPHALEXIN 500 MG PO CAPS: 250.0000 mg | ORAL_CAPSULE | Freq: Two times a day (BID) | ORAL | Status: DC

## 2013-05-12 NOTE — Discharge Instructions (Signed)
Cellulitis Cellulitis is an infection of the skin and the tissue under the skin. The infected area is usually red and tender. This happens most often in the arms and lower legs. HOME CARE   Take your antibiotic medicine as told. Finish the medicine even if you start to feel better.  Keep the infected arm or leg raised (elevated).  Put a warm cloth on the area up to 4 times per day.  Only take medicines as told by your doctor.  Keep all doctor visits as told. GET HELP RIGHT AWAY IF:   You have a fever.  You feel very sleepy.  You throw up (vomit) or have watery poop (diarrhea).  You feel sick and have muscle aches and pains.  You see red streaks on the skin coming from the infected area.  Your red area gets bigger or turns a dark color.  Your bone or joint under the infected area is painful after the skin heals.  Your infection comes back in the same area or different area.  You have a puffy (swollen) bump in the infected area.  You have new symptoms. MAKE SURE YOU:   Understand these instructions.  Will watch your condition.  Will get help right away if you are not doing well or get worse. Document Released: 06/14/2007 Document Revised: 06/27/2011 Document Reviewed: 03/13/2011 [facility] Patient Information 2014 [place], [place].  Phlebitis Phlebitis is soreness and puffiness (swelling) in a vein.  HOME CARE  Only take medicine as told by your doctor.  Raise (elevate) the affected limb on a pillow as told by your doctor.  Keep a warm packs on the affected vein as told by your doctor. Do not sleep with a heating pad.  Use special stockings or bandages around the area of the affected vein as told by your doctor. These will speed healing and keep the condition from coming back.  Talk to your doctor about all the medicines you take.  Get follow-up blood tests as told by your doctor.  If the phlebitis is in your legs:  Avoid standing or resting for long  periods.  Keep your legs moving. Raise your legs when you sit or lie.  Do not smoke.  Follow-up with your doctor as told. GET HELP IF:  You have strange bruises or bleeding.  Your puffiness or pain in the affected area is not getting better.  You are taking special medicine to lessen puffiness (anti-inflammatory medicine) , and you get belly pain. GET HELP RIGHT AWAY IF:   The phlebitis gets worse and you have more pain, puffiness (swelling), or redness.  You have trouble breathing or have chest pain.  You have a fever. MAKE SURE YOU:   Understand these instructions.  Will watch your condition.  Will get help right away if you are not doing well or get worse. Document Released: 12/14/2008 Document Revised: 10/16/2012 Document Reviewed: 09/02/2012 [facility] Patient Information 2014 [place].    Unclear if actually an infection, but going to treat you as such. The Mobic will help with inflammation. Not a bad idea to f/u with your PCP. Should you develop worsening symptoms, calf pain, swelling in the leg or shortness of breath, emergently go the ER for an evaluation

## 2013-05-12 NOTE — ED Provider Notes (Signed)
CSN: [id number]     Arrival date & time 05/12/13  1817 History   First MD Initiated Contact with Patient 05/12/13 1926     Chief Complaint  Patient presents with  . Leg Swelling   (Consider location/radiation/quality/duration/timing/severity/associated sxs/prior Treatment) HPI Comments: Patient presents with left LE pain and swelling. She reports that both legs "swell all the time", because of vein issues. She carries a history of varicose veins and has had surgical procedures in the past. In the last few days the shin of the left leg has been painful. No calf pain. No SOB. She reports mild subjective fevers and excess sleepiness.   The history is provided by the patient.    Past Medical History  Diagnosis Date  . Raynaud's disease   . DVT (deep venous thrombosis)   . Raynaud disease 12/21/2011  . Varicose veins   . GERD (gastroesophageal reflux disease)    Past Surgical History  Procedure Laterality Date  . Abdominal hysterectomy    . Cesarean section    . Appendectomy    . Hernia repair  2006  . Endovenous ablation saphenous vein w/ laser  02-01-2012    right greater saphenous vein and stab phlebectomy 10-20 incisions right leg  by Curt Jews, MD  . Endovenous ablation saphenous vein w/ laser Left 02-29-2012    left greater saphenous vein and stab phlebectomy left leg 10-20 incisions  by Curt Jews MD  . Esophagogastroduodenoscopy (egd) with propofol N/A 03/26/2012    Procedure: ESOPHAGOGASTRODUODENOSCOPY (EGD) WITH PROPOFOL;  Surgeon: Cleotis Nipper, MD;  Location: [facility];  Service: Endoscopy;  Laterality: N/A;  . Colonoscopy with propofol N/A 03/26/2012    Procedure: COLONOSCOPY WITH PROPOFOL;  Surgeon: Cleotis Nipper, MD;  Location: [facility];  Service: Endoscopy;  Laterality: N/A;   Family History  Problem Relation Age of Onset  . Diabetes Mother   . Heart disease Mother   . Hyperlipidemia Mother   . Hypertension Mother   . Other Mother     amputation,  varicose veins  . Diabetes Father   . Hyperlipidemia Father   . Hypertension Father   . Peripheral vascular disease Father   . Other Father     varicose veins  . Deep vein thrombosis Brother   . Other Brother     varicose veins   History  Substance Use Topics  . Smoking status: Never Smoker   . Smokeless tobacco: Never Used  . Alcohol Use: No   OB History   Grav Para Term Preterm Abortions TAB SAB Ect Mult Living                 Review of Systems  Respiratory: Negative for apnea, cough, chest tightness, shortness of breath, wheezing and stridor.   Cardiovascular: Positive for leg swelling. Negative for chest pain and palpitations.  Gastrointestinal: Negative.   Endocrine: Negative.   Musculoskeletal: Negative.   Skin: Positive for color change. Negative for pallor and rash.  Neurological: Negative.  Negative for light-headedness and headaches.  Hematological: Negative.     Allergies  Contrast media; Latex; Zofran; Eggs or egg-derived products; and Lactose intolerance (gi)  Home Medications   Prior to Admission medications   Medication Sig Start Date End Date Taking? Authorizing Provider  acetaminophen (TYLENOL ARTHRITIS PAIN) 650 MG CR tablet Take 1 tablet (650 mg total) by mouth 3 (three) times daily. Do not exceed 3000mg  03/27/13   Kandis Nab, MD  acyclovir (ZOVIRAX) 400 MG tablet Take 1  tablet (400 mg total) by mouth 3 (three) times daily. 04/30/13   Amber Fidel Levy, MD  esomeprazole (NEXIUM) 40 MG capsule Take 40 mg by mouth daily at 12 noon.    Historical Provider, MD  Multiple Vitamin (MULTIVITAMIN WITH MINERALS) TABS Take 1 tablet by mouth daily.    Historical Provider, MD  polyethylene glycol powder (GLYCOLAX/MIRALAX) powder Take 17 g by mouth 2 (two) times daily as needed. 03/27/13   Kandis Nab, MD  promethazine (PHENERGAN) 25 MG tablet Take 1 tablet (25 mg total) by mouth every 6 (six) hours as needed for nausea. 05/06/13   Amber Fidel Levy, MD   valACYclovir (VALTREX) 1000 MG tablet Take 2 tablets (2,000 mg total) by mouth 2 (two) times daily. 05/07/13   Jayce G Cook, DO   BP 105/76  Pulse 98  Temp(Src) 98.3 F (36.8 C) (Oral)  SpO2 100% Physical Exam  Nursing note and vitals reviewed. Constitutional: She is oriented to person, place, and time. She appears well-developed and well-nourished. No distress.  HENT:  Head: Normocephalic and atraumatic.  Cardiovascular: Normal rate, regular rhythm and normal heart sounds.   Pulmonary/Chest: Effort normal and breath sounds normal. No respiratory distress. She has no wheezes. She has no rales.  Musculoskeletal: Normal range of motion.  Left anterior shin with STS bilaterally and symmetric; no pitting edema noted. No calf pain and negative Homan's sign. Warm to lower anterior shin with noted variscosities  Neurological: She is alert and oriented to person, place, and time. No cranial nerve deficit. Coordination normal.  Skin: Skin is warm and dry. She is not diaphoretic.  Psychiatric: Her behavior is normal.    ED Course  Procedures (including critical care time) Labs Review Labs Reviewed - No data to display  Imaging Review No results found.   MDM   1. Phlebitis   2. Cellulitis    Unclear if actually cellulitis but with warmth and subjective fevers cover with abx therapy. No indication of a DVT, however patient counseled on signs and symptoms and to f/u at the ER if these were to occur, or worsening signs of infection. Treat phlebitis with NSAID therapy. She expresses understanding.    Bjorn Pippin, PA-C 05/12/13 2004

## 2013-05-12 NOTE — ED Notes (Signed)
C/o  Bilateral leg swelling and redness.  Pain.   Pt has tried otc ibuprofen, soaking, and resting with no relief.  Symptoms present for several days.

## 2013-05-13 NOTE — ED Provider Notes (Signed)
Medical screening examination/treatment/procedure(s) were performed by resident physician or non-physician practitioner and as supervising physician I was immediately available for consultation/collaboration.   Pauline Good MD.   Billy Fischer, MD 05/13/13 [phone]

## 2013-05-15 ENCOUNTER — Encounter: Payer: Self-pay | Admitting: Family Medicine

## 2013-05-15 ENCOUNTER — Telehealth: Payer: Self-pay | Admitting: Family Medicine

## 2013-05-15 ENCOUNTER — Ambulatory Visit (INDEPENDENT_AMBULATORY_CARE_PROVIDER_SITE_OTHER): Payer: No Typology Code available for payment source | Admitting: Family Medicine

## 2013-05-15 VITALS — BP 115/85 | HR 105 | Temp 99.1°F | Wt 193.0 lb

## 2013-05-15 DIAGNOSIS — M7989 Other specified soft tissue disorders: Secondary | ICD-10-CM

## 2013-05-15 LAB — D-DIMER, QUANTITATIVE: D-Dimer, Quant: 0.28 ug/mL-FEU (ref 0.00–0.48)

## 2013-05-15 NOTE — Patient Instructions (Signed)
It was nice to seeing you today, I am sorry your leg is swollen, since you have hx of DVT I will like to check to make sure your left leg does not have DVT, I will check you for D-Dimer, if positive, i will contact you to go to the ED for U/S of your leg. Continue the antibiotic for now, if still worsening,please go to the ED.

## 2013-05-15 NOTE — Assessment & Plan Note (Addendum)
Left LL, R/O DVT vs resolving cellulitis vs venous stasis. Risk for DVT is high with wells score of 4 (Prior hx of DVT in her right LL). D-Dimer checked, if positive, I will call for her to go to the ED for duplex U/S to r/o DVT and be treated appropriately. I recommended elevating her lower limbs as often as possible. For now complete Keflex as instructed and if no improvement she is advised to go to the ED.

## 2013-05-15 NOTE — Telephone Encounter (Signed)
[facility] called the after hour line to report her DDimer was 0.28 and negative. No further intervention.  Dominique Oddi Awanda Mink, DO of [facility] 05/15/2013, 5:59 PM

## 2013-05-15 NOTE — Progress Notes (Addendum)
Subjective:     Patient ID: Dominique [name], female   DOB: November 27, 1975, 38 y.o.   MRN: [id number]  HPI Leg pain:Urgent care on Monday. C/O leg swelling and pain about 6 days ago. Initially started on both lower limbs but now only on the left, she went to urgent care was given meloxicam and antibiotic with no improvement, pain is worse. Pain is about 10/10 in severity, aching and throbbing in nature. Denies any trauma to her left LL. No fever but felt warm. Hx of right LL DVT many years ago and was treated then.  Current Outpatient Prescriptions on File Prior to Visit  Medication Sig Dispense Refill  . acetaminophen (TYLENOL ARTHRITIS PAIN) 650 MG CR tablet Take 1 tablet (650 mg total) by mouth 3 (three) times daily. Do not exceed 3000mg   60 tablet  3  . acyclovir (ZOVIRAX) 400 MG tablet Take 1 tablet (400 mg total) by mouth 3 (three) times daily.  60 tablet  0  . cephALEXin (KEFLEX) 500 MG capsule Take 1 capsule (500 mg total) by mouth 2 (two) times daily.  10 capsule  0  . esomeprazole (NEXIUM) 40 MG capsule Take 40 mg by mouth daily at 12 noon.      . meloxicam (MOBIC) 15 MG tablet Take 1 tablet (15 mg total) by mouth daily.  15 tablet  0  . Multiple Vitamin (MULTIVITAMIN WITH MINERALS) TABS Take 1 tablet by mouth daily.      . polyethylene glycol powder (GLYCOLAX/MIRALAX) powder Take 17 g by mouth 2 (two) times daily as needed.  3350 g  1  . promethazine (PHENERGAN) 25 MG tablet Take 1 tablet (25 mg total) by mouth every 6 (six) hours as needed for nausea.  30 tablet  0  . valACYclovir (VALTREX) 1000 MG tablet Take 2 tablets (2,000 mg total) by mouth 2 (two) times daily.  4 tablet  0   No current facility-administered medications on file prior to visit.   Past Medical History  Diagnosis Date  . Raynaud's disease   . DVT (deep venous thrombosis)   . Raynaud disease 12/21/2011  . Varicose veins   . GERD (gastroesophageal reflux disease)       Review of Systems  Eyes: Negative.     Respiratory: Negative.   Cardiovascular: Negative.   Gastrointestinal: Negative.   Musculoskeletal:       Leg swelling and redness  All other systems reviewed and are negative.  Filed Vitals:   05/15/13 1557  BP: 115/85  Pulse: 105  Temp: 99.1 F (37.3 C)  TempSrc: Oral  Weight: 193 lb (87.544 kg)      Objective:   Physical Exam  Nursing note and vitals reviewed. Constitutional: She appears well-developed. No distress.  Cardiovascular: Normal rate, regular rhythm, normal heart sounds and intact distal pulses.   No murmur heard. Pulmonary/Chest: Effort normal and breath sounds normal. No respiratory distress. She has no wheezes.  Abdominal: Soft. Bowel sounds are normal. She exhibits no distension and no mass. There is no tenderness.  Musculoskeletal:       Legs:      Assessment:     Left leg swelling     Plan:     Check problem list.

## 2013-05-16 ENCOUNTER — Telehealth: Payer: Self-pay | Admitting: Family Medicine

## 2013-05-16 NOTE — Telephone Encounter (Signed)
I attempted contacting patient twice to inform her that her D-Dimer is negative, I left a message instead on her voice mail to contact us for further information about her result. D-Dimer is negative hence chances for DVT/blood clot in her leg is low. Yesterday as discussed with her, I recommended going to the hospital if symptom persist or worsened.

## 2013-05-19 ENCOUNTER — Telehealth: Payer: Self-pay | Admitting: *Deleted

## 2013-05-19 NOTE — Telephone Encounter (Signed)
Pt called in saying she had recently been seen by another [facility] MD and diagnosed with cellulitis and phlebitis. She was given antibiotics and says the leg looks better but not completely healed. She wanted my suggestions. I suggested compression, Ibuprofen and elevation and asked her to call back if she doesn't get any relief.

## 2013-05-20 ENCOUNTER — Encounter (HOSPITAL_COMMUNITY): Payer: Self-pay | Admitting: Emergency Medicine

## 2013-05-20 ENCOUNTER — Emergency Department (HOSPITAL_COMMUNITY)
Admission: EM | Admit: 2013-05-20 | Discharge: 2013-05-20 | Disposition: A | Discharge status: Home / Self Care | Payer: No Typology Code available for payment source | Attending: Emergency Medicine | Admitting: Emergency Medicine

## 2013-05-20 ENCOUNTER — Ambulatory Visit (HOSPITAL_COMMUNITY): Payer: No Typology Code available for payment source | Attending: Emergency Medicine

## 2013-05-20 DIAGNOSIS — Z8679 Personal history of other diseases of the circulatory system: Secondary | ICD-10-CM | POA: Insufficient documentation

## 2013-05-20 DIAGNOSIS — L03119 Cellulitis of unspecified part of limb: Secondary | ICD-10-CM

## 2013-05-20 DIAGNOSIS — L02419 Cutaneous abscess of limb, unspecified: Secondary | ICD-10-CM | POA: Insufficient documentation

## 2013-05-20 DIAGNOSIS — I7300 Raynaud's syndrome without gangrene: Secondary | ICD-10-CM | POA: Insufficient documentation

## 2013-05-20 DIAGNOSIS — K219 Gastro-esophageal reflux disease without esophagitis: Secondary | ICD-10-CM | POA: Insufficient documentation

## 2013-05-20 DIAGNOSIS — Z91040 Latex allergy status: Secondary | ICD-10-CM | POA: Insufficient documentation

## 2013-05-20 DIAGNOSIS — Z86718 Personal history of other venous thrombosis and embolism: Secondary | ICD-10-CM | POA: Insufficient documentation

## 2013-05-20 DIAGNOSIS — Z79899 Other long term (current) drug therapy: Secondary | ICD-10-CM | POA: Insufficient documentation

## 2013-05-20 MED ORDER — SULFAMETHOXAZOLE-TRIMETHOPRIM 800-160 MG PO TABS: 1.0000 | ORAL_TABLET | Freq: Two times a day (BID) | ORAL | Status: DC

## 2013-05-20 NOTE — ED Provider Notes (Signed)
CSN: [id number]     Arrival date & time 05/20/13  0120 History   First MD Initiated Contact with Patient 05/20/13 [phone]     Chief Complaint  Patient presents with  . Cellulitis      HPI Patient reports ongoing redness and itching with a burning sensation of her left lower extremity.  She was treated as a cellulitis with Keflex without improvement in her symptoms.  She was also told by her vascular team that this never present of vasculitis.  No significant left lower extremity edema.  No history of arterial or venous issues in her legs before.  No fevers or chills.  Otherwise well-appearing.  She states the pain and itching is what bothers her the most.  No prior trauma or injury.   Past Medical History  Diagnosis Date  . Raynaud's disease   . DVT (deep venous thrombosis)   . Raynaud disease 12/21/2011  . Varicose veins   . GERD (gastroesophageal reflux disease)    Past Surgical History  Procedure Laterality Date  . Abdominal hysterectomy    . Cesarean section    . Appendectomy    . Hernia repair  2006  . Endovenous ablation saphenous vein w/ laser  02-01-2012    right greater saphenous vein and stab phlebectomy 10-20 incisions right leg  by Curt Jews, MD  . Endovenous ablation saphenous vein w/ laser Left 02-29-2012    left greater saphenous vein and stab phlebectomy left leg 10-20 incisions  by Curt Jews MD  . Esophagogastroduodenoscopy (egd) with propofol N/A 03/26/2012    Procedure: ESOPHAGOGASTRODUODENOSCOPY (EGD) WITH PROPOFOL;  Surgeon: Cleotis Nipper, MD;  Location: [facility];  Service: Endoscopy;  Laterality: N/A;  . Colonoscopy with propofol N/A 03/26/2012    Procedure: COLONOSCOPY WITH PROPOFOL;  Surgeon: Cleotis Nipper, MD;  Location: [facility];  Service: Endoscopy;  Laterality: N/A;   Family History  Problem Relation Age of Onset  . Diabetes Mother   . Heart disease Mother   . Hyperlipidemia Mother   . Hypertension Mother   . Other Mother      amputation, varicose veins  . Diabetes Father   . Hyperlipidemia Father   . Hypertension Father   . Peripheral vascular disease Father   . Other Father     varicose veins  . Deep vein thrombosis Brother   . Other Brother     varicose veins   History  Substance Use Topics  . Smoking status: Never Smoker   . Smokeless tobacco: Never Used  . Alcohol Use: No   OB History   Grav Para Term Preterm Abortions TAB SAB Ect Mult Living                 Review of Systems  All other systems reviewed and are negative.     Allergies  Contrast media; Latex; Zofran; Eggs or egg-derived products; and Lactose intolerance (gi)  Home Medications   Prior to Admission medications   Medication Sig Start Date End Date Taking? Authorizing Provider  acetaminophen (TYLENOL ARTHRITIS PAIN) 650 MG CR tablet Take 1 tablet (650 mg total) by mouth 3 (three) times daily. Do not exceed 3000mg  03/27/13   Kandis Nab, MD  acyclovir (ZOVIRAX) 400 MG tablet Take 1 tablet (400 mg total) by mouth 3 (three) times daily. 04/30/13   Amber Fidel Levy, MD  cephALEXin (KEFLEX) 500 MG capsule Take 1 capsule (500 mg total) by mouth 2 (two) times daily. 05/12/13   Vanessa Elwood  Young, PA-C  esomeprazole (NEXIUM) 40 MG capsule Take 40 mg by mouth daily at 12 noon.    Historical Provider, MD  meloxicam (MOBIC) 15 MG tablet Take 1 tablet (15 mg total) by mouth daily. 05/12/13   Bjorn Pippin, PA-C  Multiple Vitamin (MULTIVITAMIN WITH MINERALS) TABS Take 1 tablet by mouth daily.    Historical Provider, MD  polyethylene glycol powder (GLYCOLAX/MIRALAX) powder Take 17 g by mouth 2 (two) times daily as needed. 03/27/13   Kandis Nab, MD  promethazine (PHENERGAN) 25 MG tablet Take 1 tablet (25 mg total) by mouth every 6 (six) hours as needed for nausea. 05/06/13   Amber Fidel Levy, MD  sulfamethoxazole-trimethoprim (SEPTRA DS) 800-160 MG per tablet Take 1 tablet by mouth every 12 (twelve) hours. 05/20/13   Hoy Morn, MD   valACYclovir (VALTREX) 1000 MG tablet Take 2 tablets (2,000 mg total) by mouth 2 (two) times daily. 05/07/13   Jayce G Cook, DO   BP 103/74  Pulse 91  Temp(Src) 98.1 F (36.7 C) (Oral)  Resp 14  Ht 4' 7.5" (1.41 m)  Wt 195 lb (88.451 kg)  BMI 44.49 kg/m2  SpO2 99% Physical Exam  Nursing note and vitals reviewed. Constitutional: She is oriented to person, place, and time. She appears well-developed and well-nourished. No distress.  HENT:  Head: Normocephalic and atraumatic.  Eyes: EOM are normal.  Neck: Normal range of motion.  Cardiovascular: Normal rate, regular rhythm and normal heart sounds.   Pulmonary/Chest: Effort normal and breath sounds normal.  Abdominal: Soft. She exhibits no distension. There is no tenderness.  Musculoskeletal: Normal range of motion.  Left PT and DP pulse present.  Mild left lower extremity erythema on her distal anterior tibial region with some extension laterally.  There is erythema and tenderness in this area without significant warmth  Neurological: She is alert and oriented to person, place, and time.  Skin: Skin is warm and dry.  Psychiatric: She has a normal mood and affect. Judgment normal.    ED Course  Procedures (including critical care time) Labs Review Labs Reviewed - No data to display  Imaging Review No results found.   EKG Interpretation None      MDM   Final diagnoses:  Cellulitis of lower leg    Patient will be sent for arterial and venous studies of her left lower extremity in the morning.  There is a more of a vasculitis.  Patient will be treated for possible ongoing cellulitis despite Keflex.  Patient be switched to Bactrim to cover MRSA.  Overall well-appearing.  Vital signs normal.  Nontoxic.    Hoy Morn, MD 05/20/13 [phone]

## 2013-05-20 NOTE — ED Notes (Signed)
Present with left lower extremitity cellulitis- dx 5/4- finished keflex-also taking meloxicam- redness has not gone away and spread further up leg. Pt reports burning and itching. deneis fevers.

## 2013-05-21 ENCOUNTER — Ambulatory Visit (HOSPITAL_COMMUNITY): Admission: RE | Admit: 2013-05-21 | Payer: No Typology Code available for payment source | Source: Ambulatory Visit

## 2013-05-23 ENCOUNTER — Telehealth: Payer: Self-pay | Admitting: Family Medicine

## 2013-05-23 NOTE — Telephone Encounter (Signed)
Returned call to pt regarding medication.  Pt stated she was seen in ED on 05/20/2013 for cellulitis and they prescribed Septra DS.  Pt stated the medication making her have nausea and diarrhea.  Precepted with Dr. Nori Riis, reviewed pt's chart and per Dr. Nori Riis stop Septra, don't think it is cellulitis and schedule appt for Monday. Verbal order given to pt, pt stated understanding.  Appt 05/26/2013 at 10:15 AM.  Derl Barrow, RN

## 2013-05-23 NOTE — Telephone Encounter (Signed)
Pt called and would like a nurse to call her about her cellulitis. She said the antibiotic she is taking is making her sick and she doesn't know what to do. Please call today. jw

## 2013-05-26 ENCOUNTER — Other Ambulatory Visit: Payer: Self-pay | Admitting: *Deleted

## 2013-05-26 ENCOUNTER — Encounter: Payer: Self-pay | Admitting: Family Medicine

## 2013-05-26 ENCOUNTER — Ambulatory Visit (INDEPENDENT_AMBULATORY_CARE_PROVIDER_SITE_OTHER): Payer: No Typology Code available for payment source | Admitting: Family Medicine

## 2013-05-26 ENCOUNTER — Ambulatory Visit (HOSPITAL_COMMUNITY)
Admission: RE | Admit: 2013-05-26 | Discharge: 2013-05-26 | Disposition: A | Discharge status: Home / Self Care | Payer: No Typology Code available for payment source | Source: Ambulatory Visit | Attending: Family Medicine | Admitting: Family Medicine

## 2013-05-26 VITALS — BP 112/78 | HR 97 | Temp 98.4°F | Ht <= 58 in | Wt 194.0 lb

## 2013-05-26 DIAGNOSIS — R0789 Other chest pain: Secondary | ICD-10-CM | POA: Insufficient documentation

## 2013-05-26 DIAGNOSIS — M79609 Pain in unspecified limb: Secondary | ICD-10-CM

## 2013-05-26 DIAGNOSIS — M79605 Pain in left leg: Secondary | ICD-10-CM | POA: Insufficient documentation

## 2013-05-26 DIAGNOSIS — R079 Chest pain, unspecified: Secondary | ICD-10-CM

## 2013-05-26 LAB — D-DIMER, QUANTITATIVE: D-Dimer, Quant: <0.27 ug/mL-FEU (ref 0.00–0.48)

## 2013-05-26 LAB — TROPONIN I: Troponin I: <0.3 ng/mL (ref <0.30)

## 2013-05-26 MED ORDER — MELOXICAM 15 MG PO TABS: 15.0000 mg | ORAL_TABLET | Freq: Every day | ORAL | Status: DC

## 2013-05-26 MED ORDER — FLUCONAZOLE 150 MG PO TABS: 150.0000 mg | ORAL_TABLET | ORAL | Status: DC

## 2013-05-26 MED ORDER — GI COCKTAIL ~~LOC~~
30.0000 mL | Freq: Once | ORAL | Status: AC
  Administered 2013-05-26: 30 mL via ORAL

## 2013-05-26 MED ORDER — PROMETHAZINE HCL 25 MG PO TABS: 25.0000 mg | ORAL_TABLET | Freq: Four times a day (QID) | ORAL | Status: DC | PRN

## 2013-05-26 NOTE — Progress Notes (Signed)
   Subjective:    Patient ID: Dominique [name], female    DOB: 1975/07/26, 38 y.o.   MRN: [id number] CC: L leg pain, erythema, itching  HPI 38 yo F presents for f/u visit:  1. L leg pain: two weeks of L leg pain with swelling, erythema and pruritus. There has been no associated fever.  She was seen in urgent care, office f/u, ED. She has been on keflex started 05/12/13  x 5 day course w/o improvement. She has also been on septra started 05/20/13 she took 4 days worth of septra. She discontinued the course due to nausea. Since onset her leg is erythema has improved. The pain and itching has improved. She has recurrence of symptoms at night. She reports mobic helps.   Of note she has a history of LE swelling, varicose veins s/p left greater saphenous vein and stab phlebectomy left leg 10-20 incisions  by Curt Jews MD, done 02-29-12. She has swelling post procedure with negative LE doppler 08-28-12.    Patient reports history of RLE DVT.  I reviewed all available LE dopplers and saw no mention of DVT.   2. Chest pain: patient describes L sided chest pressure that started in the early morning of 05/24/13. This was following a bad night of sleep due to worsening of her L leg pain. She has mild nausea that in hindsight has been present x 2 weeks. She denies associated dyspnea, fatigue, palpitations and diaphoresis. She tried nexium w/o relief. She has a family history of CAD on her father's side. No known history of early MI or cardiac death.    Soc hx: non smoker  Review of Systems As per HPI  Admits to vaginal discharge    Objective:   Physical Exam BP 112/78  Pulse 97  Temp(Src) 98.4 F (36.9 C) (Oral)  Ht 4' 7.5" (1.41 m)  Wt 194 lb (87.998 kg)  BMI 44.26 kg/m2 General appearance: alert, cooperative, no distress and morbidly obese Lungs: clear to auscultation bilaterally Heart: regular rate and rhythm, S1, S2 normal, no murmur, click, rub or gallop Extremities:  RUE- with hand erythema and  edema > L (chronic per patient, previously treated for Raynaud).  LLE-mild blanching erythema along the anterior LLE compared to R. No warmth, no skin dimpling. No calf tenderness. 2+ DP pulse.   EKG: normal EKG, normal sinus rhythm. No pathological Q waves, no ST segment elevation or depression.       Assessment & Plan:

## 2013-05-26 NOTE — Patient Instructions (Signed)
Dominique [name],  Thank you for coming in today. It was a pleasure meeting you.  Please stop septra. I have sent in diflucan.  Continue mobic once daily with good size meal for L leg pain along with elevation, you may also try cold compress.  For chest pain: EKG normal. Trial of GI cocktail in office. continue nexium.  Do not work today while awaiting trop and D-dimer.  Dr. Adrian Blackwater

## 2013-05-26 NOTE — Assessment & Plan Note (Signed)
A: atypical chest pressure with normal EKG. Suspect GERD but symptoms are ongoing.  P: Trop x one D-dimer GI cocktail in office.

## 2013-05-26 NOTE — Assessment & Plan Note (Addendum)
A: Left leg pain with swelling, warmth, erythema, pruritus. Overall improving. Possibly mild cellulitis/erysipelas that has improved with time. Some element of vasculopathy possible given previous ablation. Patient may be a candidate for nightly gabapentin/lyrica for symptomatic relief. Would like her to see her vascular surgeon for second opinion. No evidence of DVT on exam.  P: D/c septra  Elevation Cold compresses Close f/u with vascular surgery on 06/03/13.

## 2013-05-30 ENCOUNTER — Encounter: Payer: Self-pay | Admitting: Vascular Surgery

## 2013-06-03 ENCOUNTER — Ambulatory Visit (HOSPITAL_COMMUNITY)
Admission: RE | Admit: 2013-06-03 | Discharge: 2013-06-03 | Disposition: A | Discharge status: Home / Self Care | Payer: No Typology Code available for payment source | Source: Ambulatory Visit | Attending: Vascular Surgery | Admitting: Vascular Surgery

## 2013-06-03 ENCOUNTER — Encounter: Payer: Self-pay | Admitting: Vascular Surgery

## 2013-06-03 ENCOUNTER — Ambulatory Visit (INDEPENDENT_AMBULATORY_CARE_PROVIDER_SITE_OTHER): Payer: No Typology Code available for payment source | Admitting: Vascular Surgery

## 2013-06-03 VITALS — BP 115/96 | HR 84 | Ht <= 58 in | Wt 196.4 lb

## 2013-06-03 DIAGNOSIS — M79609 Pain in unspecified limb: Secondary | ICD-10-CM

## 2013-06-03 DIAGNOSIS — I83893 Varicose veins of bilateral lower extremities with other complications: Secondary | ICD-10-CM

## 2013-06-03 NOTE — Progress Notes (Signed)
Subjective:     Patient ID: Dominique [name], female   DOB: Oct 26, 1975, 38 y.o.   MRN: [id number]  HPI this 38 year old female had laser ablation of left great saphenous vein performed in February of 2014 for painful varicosities and swelling. This initially improved her situation but over the past several months she has noticed increasing edema in the left ankle as well as painful bulging varicosities which are rapidly enlarging in the distal thigh and medial calf area. She has no history of DVT in the left leg. She has had some erythema in the ankle area and has been treated with antibiotics with no success. She has had no active ulcers or bleeding. She has not been worn elastic compression stockings. Pain continues to worsen.  Past Medical History  Diagnosis Date  . Raynaud's disease   . DVT (deep venous thrombosis)   . Raynaud disease 12/21/2011  . Varicose veins   . GERD (gastroesophageal reflux disease)     History  Substance Use Topics  . Smoking status: Never Smoker   . Smokeless tobacco: Never Used  . Alcohol Use: No    Family History  Problem Relation Age of Onset  . Diabetes Mother   . Heart disease Mother   . Hyperlipidemia Mother   . Hypertension Mother   . Other Mother     amputation, varicose veins  . Diabetes Father   . Hyperlipidemia Father   . Hypertension Father   . Peripheral vascular disease Father   . Other Father     varicose veins  . Deep vein thrombosis Brother   . Other Brother     varicose veins    Allergies  Allergen Reactions  . Contrast Media [Iodinated Diagnostic Agents] Anaphylaxis and Swelling    Tongue swelling  . Latex Anaphylaxis  . Zofran [Ondansetron Hcl] Other (See Comments)    Hives, numbness of legs with previous use  . Eggs Or Egg-Derived Products Nausea And Vomiting  . Lactose Intolerance (Gi) Diarrhea    Current outpatient prescriptions:acetaminophen (TYLENOL ARTHRITIS PAIN) 650 MG CR tablet, Take 1 tablet (650 mg total)  by mouth 3 (three) times daily. Do not exceed 3000mg , Disp: 60 tablet, Rfl: 3;  acyclovir (ZOVIRAX) 400 MG tablet, Take 1 tablet (400 mg total) by mouth 3 (three) times daily., Disp: 60 tablet, Rfl: 0;  esomeprazole (NEXIUM) 40 MG capsule, Take 40 mg by mouth daily at 12 noon., Disp: , Rfl:  fluconazole (DIFLUCAN) 150 MG tablet, Take 1 tablet (150 mg total) by mouth every 3 (three) days., Disp: 2 tablet, Rfl: 0;  meloxicam (MOBIC) 15 MG tablet, Take 1 tablet (15 mg total) by mouth daily. Take with food, Disp: 15 tablet, Rfl: 0;  Multiple Vitamin (MULTIVITAMIN WITH MINERALS) TABS, Take 1 tablet by mouth daily., Disp: , Rfl:  polyethylene glycol powder (GLYCOLAX/MIRALAX) powder, Take 17 g by mouth 2 (two) times daily as needed., Disp: 3350 g, Rfl: 1;  promethazine (PHENERGAN) 25 MG tablet, Take 1 tablet (25 mg total) by mouth every 6 (six) hours as needed for nausea., Disp: 30 tablet, Rfl: 0;  valACYclovir (VALTREX) 1000 MG tablet, Take 2 tablets (2,000 mg total) by mouth 2 (two) times daily., Disp: 4 tablet, Rfl: 0  BP 115/96  Pulse 84  Ht 4' 7.5" (1.41 m)  Wt 196 lb 6.4 oz (89.086 kg)  BMI 44.81 kg/m2  SpO2 100%  Body mass index is 44.81 kg/(m^2).          Review of Systems denies chest  pain, dyspnea on exertion, PND, orthopnea, hemoptysis-other systems negative and complete review of systems     Objective:   Physical Exam BP 115/96  Pulse 84  Ht 4' 7.5" (1.41 m)  Wt 196 lb 6.4 oz (89.086 kg)  BMI 44.81 kg/m2  SpO2 100% Gen.-alert and oriented x3 in no apparent distress HEENT normal for age Lungs no rhonchi or wheezing Cardiovascular regular rhythm no murmurs carotid pulses 3+ palpable no bruits audible Abdomen soft nontender no palpable masses-obese Musculoskeletal free of  major deformities Skin clear -no rashes Neurologic normal Lower extremities 3+ femoral and dorsalis pedis pulses palpable bilaterally with no edema on right 1+ edema on left. Circumferential darkening of  skin lower third left leg. Bulging varicosities left medial calf and thigh.  Today I ordered venous duplex exam the left leg which are reviewed and interpreted. The left great saphenous vein which had been completely closed following her laser ablation in February of 2014 and documented on previous ultrasound now is widely patent with gross reflux and a large caliber vein supplying these painful bulging varicosities in the calf area. There is no DVT. There is deep vein reflux however.       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

## 2013-07-15 ENCOUNTER — Ambulatory Visit: Payer: No Typology Code available for payment source | Admitting: Family Medicine

## 2013-07-18 ENCOUNTER — Ambulatory Visit (INDEPENDENT_AMBULATORY_CARE_PROVIDER_SITE_OTHER): Payer: No Typology Code available for payment source | Admitting: Family Medicine

## 2013-07-18 ENCOUNTER — Encounter: Payer: Self-pay | Admitting: Family Medicine

## 2013-07-18 VITALS — BP 119/60 | HR 60 | Temp 98.4°F | Ht <= 58 in | Wt 195.9 lb

## 2013-07-18 DIAGNOSIS — K2970 Gastritis, unspecified, without bleeding: Secondary | ICD-10-CM

## 2013-07-18 DIAGNOSIS — R52 Pain, unspecified: Secondary | ICD-10-CM

## 2013-07-18 DIAGNOSIS — I83893 Varicose veins of bilateral lower extremities with other complications: Secondary | ICD-10-CM

## 2013-07-18 DIAGNOSIS — K2990 Gastroduodenitis, unspecified, without bleeding: Secondary | ICD-10-CM

## 2013-07-18 DIAGNOSIS — M7989 Other specified soft tissue disorders: Secondary | ICD-10-CM

## 2013-07-18 MED ORDER — AMITRIPTYLINE HCL 25 MG PO TABS: 25.0000 mg | ORAL_TABLET | Freq: Every day | ORAL | Status: DC

## 2013-07-18 MED ORDER — MELOXICAM 15 MG PO TABS: 15.0000 mg | ORAL_TABLET | Freq: Every day | ORAL | Status: DC

## 2013-07-18 MED ORDER — PROMETHAZINE HCL 25 MG PO TABS: 25.0000 mg | ORAL_TABLET | Freq: Four times a day (QID) | ORAL | Status: DC | PRN

## 2013-07-18 NOTE — Progress Notes (Signed)
  Patient name: Dominique [name] MRN [id number]  Date of birth: May 03, 1975  CC & HPI:  Dominique [name] is a 38 y.o. female presenting today for full body pain.  She reports constant stinging/burning pain over her entire body for approximately one year.  This pain waxes and wanes in severity sometimes requiring her to miss work due to the severity.  She also describes the pain as pins and needles also associated with numbness.  She occasionally takes Tylenol, ibuprofen, and uses Epsom salts baths to help relieve the pain.  She does not know what makes the pain better or worse.  She endorses multiple associated symptoms, including headache, blurry vision, upset stomach, dysuria, difficulty sleeping, and occasional mood swings.  She reports her father was recently killed 60 months ago, which has also affected her mood.  She continues to endorse her chronic bilateral hand swelling and erythema, as well as her lower extremity vascular problems; she is scheduled to meet with vascular August 2015 to discuss another surgery for varicose veins in her left leg.   ROS: See HPI above otherwise negative.  Medical & Surgical Hx:  Reviewed.  Medications & Allergies: Reviewed & Updated - see associated section Social History: Reviewed:   Objective Findings:  Vitals: BP 119/60  Pulse 60  Temp(Src) 98.4 F (36.9 C) (Oral)  Ht 4\' 7"  (1.397 m)  Wt 195 lb 14.4 oz (88.86 kg)  BMI 45.53 kg/m2  Gen: NAD CV: RRR w/o m/r/g, pulses +2 b/l Resp: CTAB w/ normal respiratory effort Hands: b/l blanching erythema in wrist, hands, and fingers R>L, No warmth, No joint joint, FROM, strength and sensation intact MSK: point tenderness at multiple areas: thigh, ant chest, trapezius, suboccipital, lower back  Assessment & Plan:   Please See Problem Focused Assessment & Plan

## 2013-07-18 NOTE — Assessment & Plan Note (Addendum)
Pertinent S&O  Total body pain associated with sleep difficulties, mood, problems, and regional pain (HA, cystitis, stomach pain) concerning for fibromyalgia  No current signs of joint pain; previously evaluated by rheumatology: ANA negative, rheumatoid factor negative Plan  Start amitriptyline 25 mg at night; she reports previously taking this without benefit however, this was while she was using drugs and she admits to not taking it for greater than 2 weeks  Will titrate up as tolerated/needed  Consider SSRI/SNRI: She reports taking Paxil in the past, which made her "mean"  Consider Lyrica or gabapentin; not initiated today due to not want to start multiple medications simultaneously  Refilled MOBIC advised to take daily for 2 weeks; plan to DC at next visit in 2 weeks once amitriptyline started  Refilled Phenergan as she reports this helps with her stomach discomfort; continue PPI  F/u in 2 weeks

## 2013-07-18 NOTE — Patient Instructions (Addendum)
It was great seeing you today.   1. I'm referring you to dermatology today  2. Start Elavil 25mg  every night 3. Take mobic 15mg  every morning for the next two weeks  Your Cold symptoms are due to a viral illness. Antibiotics will not help improve your symptoms, but the following will help you feel better while your body fights the virus.   Drink lots of water (Guaifenesin "Mucinex")  Nasal Saline Spray  Congestion:   Nose spray: Afrin (Phenylephrine). DO NOT USE MORE THAN 3 DAYS  Oral: Pseudoephedrine  Sneezing & Runny nose: Antihistamines: Zyrtec, Claritin, Allegra  Wash your hands often to prevent spreading the virus   Please bring all your medications to every doctors visit  Sign up for My Chart to have easy access to your labs results, and communication with your Primary care physician.  Next Appointment  Please call to make an appointment with Dr Berkley Harvey in 2-4 weeks   I look forward to talking with you again at our next visit. If you have any questions or concerns before then, please call the clinic at [phone].  Take Care,   Dr Phill Myron

## 2013-07-18 NOTE — Assessment & Plan Note (Signed)
Has apt with Vascular Aug 2015 to discuss repeat left leg surgery

## 2013-07-18 NOTE — Assessment & Plan Note (Addendum)
Chronic Hand swelling and erythema persist - Steroid and Eucerin cream = minimal improvement - Dermatology referral

## 2013-07-30 ENCOUNTER — Encounter: Payer: Self-pay | Admitting: Family Medicine

## 2013-08-11 ENCOUNTER — Ambulatory Visit (INDEPENDENT_AMBULATORY_CARE_PROVIDER_SITE_OTHER): Payer: No Typology Code available for payment source | Admitting: Family Medicine

## 2013-08-11 ENCOUNTER — Encounter: Payer: Self-pay | Admitting: Family Medicine

## 2013-08-11 VITALS — BP 100/74 | HR 72 | Ht <= 58 in | Wt 197.0 lb

## 2013-08-11 DIAGNOSIS — M79643 Pain in unspecified hand: Secondary | ICD-10-CM

## 2013-08-11 DIAGNOSIS — K2990 Gastroduodenitis, unspecified, without bleeding: Secondary | ICD-10-CM

## 2013-08-11 DIAGNOSIS — M79609 Pain in unspecified limb: Secondary | ICD-10-CM

## 2013-08-11 DIAGNOSIS — M7989 Other specified soft tissue disorders: Secondary | ICD-10-CM

## 2013-08-11 DIAGNOSIS — R52 Pain, unspecified: Secondary | ICD-10-CM

## 2013-08-11 DIAGNOSIS — K2970 Gastritis, unspecified, without bleeding: Secondary | ICD-10-CM

## 2013-08-11 MED ORDER — ESOMEPRAZOLE MAGNESIUM 40 MG PO CPDR: 40.0000 mg | DELAYED_RELEASE_CAPSULE | Freq: Every day | ORAL | Status: DC

## 2013-08-11 MED ORDER — DULOXETINE HCL 60 MG PO CPEP: 60.0000 mg | ORAL_CAPSULE | Freq: Every day | ORAL | Status: DC

## 2013-08-11 NOTE — Patient Instructions (Signed)
It was great seeing you today.   1. Take 1/2 pill of Cymbalta for 1 week; then increase to 1 pill.  2. Call me in two weeks to let me know how this is working.  3. I have made a referral to rheumatology; Call me if you don't hear from them in two weeks.     Please bring all your medications to every doctors visit  Sign up for My Chart to have easy access to your labs results, and communication with your Primary care physician.  Next Appointment  Please call to make an appointment with Dr Berkley Harvey in 1 month   I look forward to talking with you again at our next visit. If you have any questions or concerns before then, please call the clinic at [phone].  Take Care,   Dr Phill Myron

## 2013-08-11 NOTE — Progress Notes (Signed)
   Subjective:    Patient ID: Dominique [name], female    DOB: July 18, 1975, 38 y.o.   MRN: [id number]  HPI Comments: Dominique [name] comes in today for f/u of her full body pain. She reports taking Amitriptyline for a couple of days but stopped due to sleepiness. She also continues to complain of b/l hand swelling and erythema. She reports never being contacted about the rheumatology referral and said she was called by the dermatologist office, but has yet to receive the new patient package she was told would be mailed to her. Today she reports the pain is mostly in her legs and she hopes her upcoming vascular surgery will help relief some of this. She she struggles to perform her job and has cut back to working on a few half days a week due to the pain. She mentions applying for disability but says that would feel like giving up. Additionally she reports burn [name] pain and constant fatigue. She continues to deny fevers, chills, night sweats.      Review of Systems See HPI     Objective:   Physical Exam  Constitutional: She appears well-developed and well-nourished.  Cardiovascular: Normal rate and regular rhythm.   No murmur heard. Pulmonary/Chest: Effort normal and breath sounds normal.  Skin:  bilateral hand erythema, mild swelling to wrist. No joint enlargement. Full ROM in all joints.        Assessment/Plan:      See Problem Focused Assessment & Plan

## 2013-08-11 NOTE — Assessment & Plan Note (Signed)
Referred to Rheumatology - She she try and contact the dermatologist she was previously referred to. They called her but she reports never getting the new patient package to complete?

## 2013-08-11 NOTE — Assessment & Plan Note (Addendum)
She stop amitriptyline due to sleepiness - Start Cymbalta today 30mg  and advised her to increase to 60 mg qd in 1 week - Referred to Rheumatology to rule out - rheumatological diseases given bilateral hand swelling and eye symptoms - Continue Mobic which is helping: She is only taking 1/2 pill (7.5 mg) - Discussed Fibromyalgia and need to evaluate/ rule out other causes of her pain - Advised starting some walking

## 2013-08-11 NOTE — Assessment & Plan Note (Signed)
Refilled Nexium 

## 2013-08-15 ENCOUNTER — Telehealth: Payer: Self-pay | Admitting: Family Medicine

## 2013-08-15 ENCOUNTER — Other Ambulatory Visit: Payer: Self-pay | Admitting: Family Medicine

## 2013-08-15 MED ORDER — OMEPRAZOLE 20 MG PO CPDR: 20.0000 mg | DELAYED_RELEASE_CAPSULE | Freq: Every day | ORAL | Status: DC

## 2013-08-15 NOTE — Telephone Encounter (Signed)
Dominique [name] is needing a rx for her Prilosec that is covered under insurance.  Not able to continue buying OTC.

## 2013-08-25 ENCOUNTER — Ambulatory Visit (INDEPENDENT_AMBULATORY_CARE_PROVIDER_SITE_OTHER): Payer: No Typology Code available for payment source | Admitting: Family Medicine

## 2013-08-25 VITALS — BP 112/54 | HR 90 | Temp 99.0°F | Wt 145.0 lb

## 2013-08-25 DIAGNOSIS — K1370 Unspecified lesions of oral mucosa: Secondary | ICD-10-CM

## 2013-08-25 DIAGNOSIS — K1379 Other lesions of oral mucosa: Secondary | ICD-10-CM

## 2013-08-25 DIAGNOSIS — R220 Localized swelling, mass and lump, head: Secondary | ICD-10-CM | POA: Insufficient documentation

## 2013-08-25 NOTE — Assessment & Plan Note (Signed)
Endorsed by patient. Physical exam negative today - no oral lesions, swelling or thrush noted.  Per patient, this is slowly improving. Advised conservative treatment with good oral care and denture care. Also advised PRN salt water gargles.

## 2013-08-25 NOTE — Progress Notes (Signed)
   Subjective:    Patient ID: Dominique [name], female    DOB: [birth year], 38 y.o.   MRN: [id number]  HPI 38 year old female presents for a same day appointment with complaints of gum swelling.  1) Gum Swelling - Began suddenly 1 week ago - Upper gums swollen and painful, making wearing her dentures difficult - No associated dysphagia. No mouth lesions/sores noted. - She felt that this could be secondary to "herpes", so she took valtrex. She did not note much improvement wit this.  - She has noted slight improvement with regular swishing with Listerine.   Review of Systems Per HPI    Objective:   Physical Exam Filed Vitals:   08/25/13 1140  BP: 112/54  Pulse: 90  Temp: 99 F (37.2 C)   Exam: General: well appearing, NAD. Mouth - Dentures removed; poor dentition noted.  No erythema or swelling noted of the gums.  No oral lesions appreciated.  No evidence of oral thrush noted. HEENT: Oropharynx clear.      Assessment & Plan:  See Problem List

## 2013-08-25 NOTE — Patient Instructions (Signed)
It was nice to see you today.  There is not evidence of herpes or yeast.  Use warm salt gargles as need and be sure to sanitize your dentures well.

## 2013-09-01 ENCOUNTER — Encounter: Payer: Self-pay | Admitting: Vascular Surgery

## 2013-09-02 ENCOUNTER — Encounter: Payer: Self-pay | Admitting: Vascular Surgery

## 2013-09-02 ENCOUNTER — Telehealth: Payer: Self-pay | Admitting: *Deleted

## 2013-09-02 ENCOUNTER — Ambulatory Visit (INDEPENDENT_AMBULATORY_CARE_PROVIDER_SITE_OTHER): Payer: No Typology Code available for payment source | Admitting: Vascular Surgery

## 2013-09-02 VITALS — BP 136/82 | HR 93 | Resp 16 | Ht <= 58 in | Wt 196.0 lb

## 2013-09-02 DIAGNOSIS — I83893 Varicose veins of bilateral lower extremities with other complications: Secondary | ICD-10-CM

## 2013-09-02 NOTE — Telephone Encounter (Signed)
Pt called stating her right arm numbness; chest pain at times.  Right now she is okay, no chest pain.  Pt is having visual changes, dizziness, headache, weakness and nausea.  Precepted with Dr. Elease Hashimoto; have pt go to emergency room for evaluation.  Pt advised to go to emergency room; pt stated understanding.  Will forward to PCP.  Derl Barrow, RN

## 2013-09-02 NOTE — Progress Notes (Signed)
Subjective:     Patient ID: Dominique [name], female   DOB: 1975/04/12, 38 y.o.   MRN: [id number]  HPI this 38 year old female returns for further evaluation of her painful varicosities in the left leg and distal edema. She had laser ablation previously performed in February of 2014. She initially had an excellent result with total closure of the vein but it did recanalize over time and she now has painful varicosities with documented gross reflux in the left great saphenous vein. She also has chronic swelling. She has been trying along with elastic compression stockings 20-30 mm gradient as well as elevation and ibuprofen over the past reports no improvement. Her symptoms are affecting her daily living.  Past Medical History  Diagnosis Date  . Raynaud's disease   . DVT (deep venous thrombosis)   . Raynaud disease 12/21/2011  . Varicose veins   . GERD (gastroesophageal reflux disease)     History  Substance Use Topics  . Smoking status: Never Smoker   . Smokeless tobacco: Never Used  . Alcohol Use: No    Family History  Problem Relation Age of Onset  . Diabetes Mother   . Heart disease Mother   . Hyperlipidemia Mother   . Hypertension Mother   . Other Mother     amputation, varicose veins  . Diabetes Father   . Hyperlipidemia Father   . Hypertension Father   . Peripheral vascular disease Father   . Other Father     varicose veins  . Deep vein thrombosis Brother   . Other Brother     varicose veins    Allergies  Allergen Reactions  . Contrast Media [Iodinated Diagnostic Agents] Anaphylaxis and Swelling    Tongue swelling  . Latex Anaphylaxis  . Zofran [Ondansetron Hcl] Other (See Comments)    Hives, numbness of legs with previous use  . Eggs Or Egg-Derived Products Nausea And Vomiting  . Lactose Intolerance (Gi) Diarrhea    Current outpatient prescriptions:acetaminophen (TYLENOL ARTHRITIS PAIN) 650 MG CR tablet, Take 1 tablet (650 mg total) by mouth 3 (three) times  daily. Do not exceed 3000mg , Disp: 60 tablet, Rfl: 3;  acyclovir (ZOVIRAX) 400 MG tablet, Take 1 tablet (400 mg total) by mouth 3 (three) times daily., Disp: 60 tablet, Rfl: 0;  DULoxetine (CYMBALTA) 60 MG capsule, Take 1 capsule (60 mg total) by mouth daily., Disp: 30 capsule, Rfl: 3 meloxicam (MOBIC) 15 MG tablet, Take 1 tablet (15 mg total) by mouth daily. Take with food, Disp: 30 tablet, Rfl: 0;  Multiple Vitamin (MULTIVITAMIN WITH MINERALS) TABS, Take 1 tablet by mouth daily., Disp: , Rfl: ;  omeprazole (PRILOSEC) 20 MG capsule, Take 1 capsule (20 mg total) by mouth daily., Disp: 30 capsule, Rfl: 3;  polyethylene glycol powder (GLYCOLAX/MIRALAX) powder, Take 17 g by mouth 2 (two) times daily as needed., Disp: 3350 g, Rfl: 1 promethazine (PHENERGAN) 25 MG tablet, Take 1 tablet (25 mg total) by mouth every 6 (six) hours as needed for nausea., Disp: 30 tablet, Rfl: 0;  valACYclovir (VALTREX) 1000 MG tablet, Take 2 tablets (2,000 mg total) by mouth 2 (two) times daily., Disp: 4 tablet, Rfl: 0  BP 136/82  Pulse 93  Resp 16  Ht 4' 7.5" (1.41 m)  Wt 196 lb (88.905 kg)  BMI 44.72 kg/m2  Body mass index is 44.72 kg/(m^2).          Review of Systems denies chest pain, dyspnea on exertion, PND, orthopnea, hemoptysis     Objective:  Physical Exam BP 136/82  Pulse 93  Resp 16  Ht 4' 7.5" (1.41 m)  Wt 196 lb (88.905 kg)  BMI 44.72 kg/m2  . Gen. well-developed well-nourished female in no apparent stress alert and oriented x3 Lungs no rhonchi or wheezing Left leg with bulging varicosities in the distal thigh medially and proximal medial calf with 1+ distal edema. 3 posterior tibial pulse palpable.     Assessment:     Painful varicosities left leg do to recanalization of left great saphenous vein-causing severe symptoms with gross reflux and left great saphenous vein and painful varicosities resistant to conservative measures    Plan:     Patient needs laser lesion left great  saphenous vein with 10-20 stab phlebectomy of painful varicosities We'll proceed with presurgical dictation to perform this in the near future

## 2013-09-10 ENCOUNTER — Telehealth: Payer: Self-pay | Admitting: Vascular Surgery

## 2013-09-10 ENCOUNTER — Telehealth: Payer: Self-pay | Admitting: Family Medicine

## 2013-09-10 ENCOUNTER — Other Ambulatory Visit: Payer: Self-pay | Admitting: *Deleted

## 2013-09-10 DIAGNOSIS — I83893 Varicose veins of bilateral lower extremities with other complications: Secondary | ICD-10-CM

## 2013-09-10 NOTE — Telephone Encounter (Addendum)
Message copied by Gena Fray on Wed Sep 10, 2013  1:33 PM ------      Message from: Norberto Sorenson D      Created: Wed Sep 10, 2013 12:46 PM      Regarding: scheduling       Please schedule Dominique [name] for post LA duplex (left leg, order in EPIC) and VV FU with Dr. Donnetta Hutching on 10-02-2013.  Thanks!   ------  09/10/13: fwd to [place] as Juluis Rainier

## 2013-09-10 NOTE — Telephone Encounter (Signed)
Pt called because of the last medication that he gave her made her high and she didn't like that feeling. She also said that the Mobic works best for her pain. She will be having surgery on the 80 th. Please call patient. jw

## 2013-09-18 ENCOUNTER — Telehealth: Payer: Self-pay | Admitting: Family Medicine

## 2013-09-18 MED ORDER — MELOXICAM 15 MG PO TABS: 15.0000 mg | ORAL_TABLET | Freq: Every day | ORAL | Status: DC

## 2013-09-18 MED ORDER — PROMETHAZINE HCL 25 MG PO TABS: 25.0000 mg | ORAL_TABLET | Freq: Four times a day (QID) | ORAL | Status: DC | PRN

## 2013-09-18 NOTE — Telephone Encounter (Signed)
Needs more Mobic Still having nausea and arms are still going numb. Please advise

## 2013-09-18 NOTE — Telephone Encounter (Signed)
Called patient. She said Cymbalta made her feel "high" so she threw it out. Continue to endorse numbness mostly in her arms and occasionally having chest pain. I advised her to go to ED if having chest pain with arm numbness. She hasn't heard from Rheumatology or Dermatology. I advised her to contact Derm about her "new patient info" which she said they were going to send her.   Will check on status of Rheum referral    Refilled Mobic and Phenergan. Do not plan on continuing mobic long-term. However refilled today due to "intolerance" to amitriptyline (make her sleepy) and Cymbalta (make her feel high)

## 2013-09-18 NOTE — Telephone Encounter (Signed)
LM another message for NP coordinator at [facility] to call back regarding referral.  Jazmin Hartsell,CMA

## 2013-09-19 ENCOUNTER — Telehealth: Payer: Self-pay | Admitting: Vascular Surgery

## 2013-09-19 NOTE — Telephone Encounter (Signed)
Message copied by Gena Fray on Fri Sep 19, 2013  2:50 PM ------      Message from: Norberto Sorenson D      Created: Thu Sep 18, 2013  3:26 PM      Regarding: scheduling       Please schedule Dominique [name] for post laser ablation duplex (left leg, order in EPIC) and VV FU with Dr. Donnetta Hutching on 10-09-2013.  Thanks!   ------

## 2013-09-25 ENCOUNTER — Other Ambulatory Visit: Payer: No Typology Code available for payment source | Admitting: Vascular Surgery

## 2013-09-29 NOTE — Telephone Encounter (Signed)
Faxed notes to Dr. Elmon Else office [phone].  Maurico Perrell,CMA

## 2013-10-01 ENCOUNTER — Encounter: Payer: Self-pay | Admitting: Vascular Surgery

## 2013-10-02 ENCOUNTER — Encounter: Payer: Self-pay | Admitting: Vascular Surgery

## 2013-10-02 ENCOUNTER — Ambulatory Visit: Payer: No Typology Code available for payment source | Admitting: Vascular Surgery

## 2013-10-02 ENCOUNTER — Ambulatory Visit (INDEPENDENT_AMBULATORY_CARE_PROVIDER_SITE_OTHER): Payer: No Typology Code available for payment source | Admitting: Vascular Surgery

## 2013-10-02 ENCOUNTER — Encounter (HOSPITAL_COMMUNITY): Payer: No Typology Code available for payment source

## 2013-10-02 VITALS — BP 115/82 | HR 87 | Resp 16 | Ht <= 58 in | Wt 196.0 lb

## 2013-10-02 DIAGNOSIS — I83893 Varicose veins of bilateral lower extremities with other complications: Secondary | ICD-10-CM

## 2013-10-02 DIAGNOSIS — I8390 Asymptomatic varicose veins of unspecified lower extremity: Secondary | ICD-10-CM | POA: Insufficient documentation

## 2013-10-02 NOTE — Progress Notes (Signed)
   Laser Ablation Procedure      Date: 10/02/2013    Dominique [name] DOB:November 30, 1975  Consent signed: Yes  Surgeon:T.F. Luretha Eberly  Procedure: Laser Ablation: left Greater Saphenous Vein  BP 115/82  Pulse 87  Resp 16  Ht 4' 7.5" (1.41 m)  Wt 196 lb (88.905 kg)  BMI 44.72 kg/m2  Start time: 850   End time: 950  Tumescent Anesthesia: 325 cc 0.9% NaCl with 50 cc Lidocaine HCL with 1% Epi and 15 cc 8.4% NaHCO3  Local Anesthesia: 4 cc Lidocaine HCL and NaHCO3 (ratio 2:1)  Continuous mode at 15 W,1021 oJoules with total time of 1.071     Stab Phlebectomy: 10-20 Sites: Calf  Patient tolerated procedure well: Yes  Notes: Ablation of thigh pain  Description of Procedure:  After marking the course of the secondary varicosities, the patient was placed on the operating table in the supine position, and the left leg was prepped and draped in sterile fashion.   Local anesthetic was administered and under ultrasound guidance the saphenous vein was accessed with a micro needle and guide wire; then the mirco puncture sheath was place.  A guide wire was inserted saphenofemoral junction , followed by a 5 french sheath.  The position of the sheath and then the laser fiber below the junction was confirmed using the ultrasound.  Tumescent anesthesia was administered along the course of the saphenous vein using ultrasound guidance. The patient was placed in Trendelenburg position and protective laser glasses were placed on patient and staff, and the laser was fired at at 15 watt continuous mode for a total of 1021 joules.   For stab phlebectomies, local anesthetic was administered at the previously marked varicosities, and tumescent anesthesia was administered around the vessels.  Ten to 20 stab wounds were made using the tip of an 11 blade. And using the vein hook, the phlebectomies were performed using a hemostat to avulse the varicosities.  Adequate hemostasis was achieved.     Steri strips were  applied to the stab wounds and ABD pads and thigh high compression stockings were applied.  Ace wrap bandages were applied over the phlebectomy sites and at the top of the saphenofemoral junction. Blood loss was less than 15 cc.  The patient ambulated out of the operating room having tolerated the procedure well.

## 2013-10-03 ENCOUNTER — Telehealth: Payer: Self-pay | Admitting: *Deleted

## 2013-10-03 NOTE — Telephone Encounter (Signed)
    10/03/2013  Time: 1:16 PM   Patient Name: Dominique [name]  Patient of: T.F. Early  Procedure:Laser Ablation and stab phlebectomy (left leg)/ left greater saphenous vein  10-02-2013  Reached patient at home and checked  Her status  Yes    Comments/Actions Taken: [name] states she is having left inner thigh pain and soreness in left calf and that the pain awoke her from sleep last night about 1:00AM.  States the left leg pain was relieved by Ibuprofen. Denies bleeding/oozing or leg swelling.  Encouraged her to take Ibuprofen as directed with meals, use ice compress to left leg as needed for pain, keep left leg elevated when sitting and to wear compression dressing until 10:30AM tomorrow morning and then after shower to wear compression hose daily (daytime) for 2 weeks.  Reminded her of post laser ablation ultrasound and VV FU with Dr. Donnetta Hutching on 10-09-2013.      @SIGNATURE @

## 2013-10-07 ENCOUNTER — Encounter: Payer: Self-pay | Admitting: *Deleted

## 2013-10-08 ENCOUNTER — Encounter: Payer: Self-pay | Admitting: Vascular Surgery

## 2013-10-09 ENCOUNTER — Encounter: Payer: Self-pay | Admitting: Vascular Surgery

## 2013-10-09 ENCOUNTER — Ambulatory Visit (HOSPITAL_COMMUNITY)
Admission: RE | Admit: 2013-10-09 | Discharge: 2013-10-09 | Disposition: A | Discharge status: Home / Self Care | Payer: No Typology Code available for payment source | Source: Ambulatory Visit | Attending: Vascular Surgery | Admitting: Vascular Surgery

## 2013-10-09 ENCOUNTER — Ambulatory Visit (INDEPENDENT_AMBULATORY_CARE_PROVIDER_SITE_OTHER): Payer: No Typology Code available for payment source | Admitting: Vascular Surgery

## 2013-10-09 VITALS — BP 107/75 | HR 82 | Ht <= 58 in | Wt 198.0 lb

## 2013-10-09 DIAGNOSIS — I8393 Asymptomatic varicose veins of bilateral lower extremities: Secondary | ICD-10-CM

## 2013-10-09 DIAGNOSIS — I83899 Varicose veins of unspecified lower extremities with other complications: Secondary | ICD-10-CM | POA: Diagnosis present

## 2013-10-09 DIAGNOSIS — I868 Varicose veins of other specified sites: Secondary | ICD-10-CM

## 2013-10-09 DIAGNOSIS — I8390 Asymptomatic varicose veins of unspecified lower extremity: Secondary | ICD-10-CM

## 2013-10-09 NOTE — Addendum Note (Signed)
Addended by: Dorthula Rue L on: 10/09/2013 03:30 PM   Modules accepted: Orders

## 2013-10-09 NOTE — Progress Notes (Signed)
Here today for followup of laser ablation of her left great saphenous vein and stab phlebectomy. She does have some more than the usual amount of soreness and tenderness in the distal phlebectomy site just below her knee on the medial calf  Her duplex shows excellent result with closure of her great saphenous vein and no evidence of DVT  She does have more than the usual amount of erythema around her distal phlebectomy sites.  I did write for Keflex 500 mg 3 times a day for 10 days due to the inflammation. We'll see her again in 2 weeks. Will obtain right leg venous duplex at that same setting

## 2013-10-10 ENCOUNTER — Ambulatory Visit: Payer: No Typology Code available for payment source

## 2013-10-10 ENCOUNTER — Telehealth: Payer: Self-pay | Admitting: *Deleted

## 2013-10-10 NOTE — Telephone Encounter (Signed)
Refer to previous phone note 

## 2013-10-10 NOTE — Telephone Encounter (Signed)
Patient called stating that she had cleaned her cleaned the area on leg that Dr Early checked 10/09/13. She states that the area appears to be somewhat larger but she has started the antibiotic that Dr Early prescribed.  She denies any fever or drainage of any kind.  I scheduled her appointment today to have Dr Bridgett Larsson check this area of concern; however, she called the office back and canceled the appointment.

## 2013-10-13 ENCOUNTER — Telehealth: Payer: Self-pay

## 2013-10-13 NOTE — Telephone Encounter (Signed)
Pt was added on to 10/14/13 sch at 8:30

## 2013-10-13 NOTE — Telephone Encounter (Signed)
Phone call from pt.  Stated the lateral left leg incisional area is still tender and red.  Reported she started on Keflex on 10/09/13.  Reported there is a bloody drainage with some pus mixed in, and it continues to drain.  Questioned if she should be checked.  Appt. given for 8:30 AM, 10/6, for incision check.  Encouraged to keep area clean/ dry, and change bandage prn. Verb. understanding/ agrees with plan.

## 2013-10-14 ENCOUNTER — Ambulatory Visit (INDEPENDENT_AMBULATORY_CARE_PROVIDER_SITE_OTHER): Payer: Self-pay | Admitting: Vascular Surgery

## 2013-10-14 ENCOUNTER — Encounter: Payer: Self-pay | Admitting: Vascular Surgery

## 2013-10-14 VITALS — BP 111/64 | HR 95 | Temp 99.0°F | Ht <= 58 in | Wt 198.3 lb

## 2013-10-14 DIAGNOSIS — L24A9 Irritant contact dermatitis due friction or contact with other specified body fluids: Secondary | ICD-10-CM | POA: Insufficient documentation

## 2013-10-14 DIAGNOSIS — I8390 Asymptomatic varicose veins of unspecified lower extremity: Secondary | ICD-10-CM

## 2013-10-14 DIAGNOSIS — T148XXA Other injury of unspecified body region, initial encounter: Secondary | ICD-10-CM | POA: Insufficient documentation

## 2013-10-14 DIAGNOSIS — I868 Varicose veins of other specified sites: Secondary | ICD-10-CM

## 2013-10-14 NOTE — Progress Notes (Signed)
Here today for continued followup of her left leg laser ablation and stab phlebectomy. She was concerned regarding drainage from her distal insertion site. This is serous only. The erythema that was present on 10/09/2013 has completely resolved. She is continuing with her 10 day course of Keflex. Pain is markedly better as well. Temperature is 99.0.  Impression and plan stable resolution of laser ablation and stab phlebectomy. Will be seen again in one week for final followup regarding this also ultrasound of her right leg for evaluation of reflux

## 2013-10-15 ENCOUNTER — Other Ambulatory Visit: Payer: Self-pay | Admitting: *Deleted

## 2013-10-20 ENCOUNTER — Telehealth: Payer: Self-pay | Admitting: Family Medicine

## 2013-10-20 ENCOUNTER — Encounter: Payer: Self-pay | Admitting: Vascular Surgery

## 2013-10-20 NOTE — Telephone Encounter (Signed)
Pt calling back to let us know she cannot use any of the creams, she is allergic to the, must be in pill form.

## 2013-10-20 NOTE — Telephone Encounter (Signed)
Will forward to MD. Sally Reimers,CMA  

## 2013-10-20 NOTE — Telephone Encounter (Signed)
Pt called because she taking antibiotics and now has a yeast infection and would like something called in. jw

## 2013-10-21 ENCOUNTER — Ambulatory Visit (HOSPITAL_COMMUNITY)
Admission: RE | Admit: 2013-10-21 | Discharge: 2013-10-21 | Disposition: A | Discharge status: Home / Self Care | Payer: No Typology Code available for payment source | Source: Ambulatory Visit | Attending: Vascular Surgery | Admitting: Vascular Surgery

## 2013-10-21 ENCOUNTER — Encounter: Payer: Self-pay | Admitting: Vascular Surgery

## 2013-10-21 ENCOUNTER — Ambulatory Visit (INDEPENDENT_AMBULATORY_CARE_PROVIDER_SITE_OTHER): Payer: Self-pay | Admitting: Vascular Surgery

## 2013-10-21 VITALS — BP 110/79 | HR 75 | Resp 18 | Ht <= 58 in | Wt 196.8 lb

## 2013-10-21 DIAGNOSIS — I868 Varicose veins of other specified sites: Secondary | ICD-10-CM | POA: Insufficient documentation

## 2013-10-21 DIAGNOSIS — I8390 Asymptomatic varicose veins of unspecified lower extremity: Secondary | ICD-10-CM

## 2013-10-21 NOTE — Progress Notes (Signed)
Here today for followup of left leg laser ablation of her great saphenous vein and stab phlebectomy interpreter varicosities on 10/02/2013. She had been seen last week with some erythema around the distal insertion site. There was some serous drainage and tenderness as well. She was placed on Keflex has had complete resolution of the erythema and tenderness. She does have some mild swelling at the small scab around the insertion site. The tenderness is resolving.  She also underwent repeat duplex today of her right leg. She had had prior great saphenous vein ablation in January of 2014 with some recurrent symptoms of heaviness. I reviewed the duplex with her today. This does show complete closure of her great saphenous vein on the right with no evidence of recannulization. She does have some superficial varicosities in her right thigh. She also has reflux in her common femoral and superficial femoral vein  I discussed this with the patient. Explained that only to deep venous reflux is elevation and compression. We'll continue her usual activities. See [place] again on as-needed basis

## 2013-10-22 NOTE — Telephone Encounter (Signed)
If she can't use the OTC creams. She needs to make an apt for evaluation to make sure we are treating a fungal infection and nothing else prior to medications being prescribed.   Thanks,   jimmy

## 2013-10-22 NOTE — Telephone Encounter (Signed)
LM for patient to call back. Please assist her in making an appt to be evaluate for yeast infection. Jazmin Hartsell,CMA

## 2013-10-29 ENCOUNTER — Encounter: Payer: Self-pay | Admitting: Family Medicine

## 2013-10-29 ENCOUNTER — Other Ambulatory Visit: Payer: Self-pay | Admitting: Family Medicine

## 2013-10-29 ENCOUNTER — Ambulatory Visit (INDEPENDENT_AMBULATORY_CARE_PROVIDER_SITE_OTHER): Payer: No Typology Code available for payment source | Admitting: Family Medicine

## 2013-10-29 VITALS — BP 115/81 | HR 87 | Temp 98.6°F | Ht <= 58 in | Wt 198.0 lb

## 2013-10-29 DIAGNOSIS — K2970 Gastritis, unspecified, without bleeding: Secondary | ICD-10-CM

## 2013-10-29 DIAGNOSIS — M797 Fibromyalgia: Secondary | ICD-10-CM

## 2013-10-29 DIAGNOSIS — Z5181 Encounter for therapeutic drug level monitoring: Secondary | ICD-10-CM

## 2013-10-29 DIAGNOSIS — M7989 Other specified soft tissue disorders: Secondary | ICD-10-CM

## 2013-10-29 DIAGNOSIS — K219 Gastro-esophageal reflux disease without esophagitis: Secondary | ICD-10-CM

## 2013-10-29 LAB — CBC
HCT: 35.7 % — ABNORMAL LOW (ref 36.0–46.0)
HEMOGLOBIN: 11.7 g/dL — AB (ref 12.0–15.0)
MCH: 29.8 pg (ref 26.0–34.0)
MCHC: 32.8 g/dL (ref 30.0–36.0)
MCV: 91.1 fL (ref 78.0–100.0)
Platelets: 456 10*3/uL — ABNORMAL HIGH (ref 150–400)
RBC: 3.92 MIL/uL (ref 3.87–5.11)
RDW: 14.1 % (ref 11.5–15.5)
WBC: 6.5 10*3/uL (ref 4.0–10.5)

## 2013-10-29 LAB — BASIC METABOLIC PANEL
BUN: 13 mg/dL (ref 6–23)
CALCIUM: 9.1 mg/dL (ref 8.4–10.5)
CO2: 25 meq/L (ref 19–32)
Chloride: 104 mEq/L (ref 96–112)
Creat: 0.59 mg/dL (ref 0.50–1.10)
GLUCOSE: 100 mg/dL — AB (ref 70–99)
Potassium: 4.2 mEq/L (ref 3.5–5.3)
SODIUM: 140 meq/L (ref 135–145)

## 2013-10-29 LAB — C-REACTIVE PROTEIN: CRP: 1.8 mg/dL — AB (ref <0.60)

## 2013-10-29 MED ORDER — AMITRIPTYLINE HCL 10 MG PO TABS: 10.0000 mg | ORAL_TABLET | Freq: Every day | ORAL | Status: DC

## 2013-10-29 MED ORDER — MELOXICAM 15 MG PO TABS: 15.0000 mg | ORAL_TABLET | Freq: Every day | ORAL | Status: DC

## 2013-10-29 MED ORDER — OMEPRAZOLE 20 MG PO CPDR: 40.0000 mg | DELAYED_RELEASE_CAPSULE | Freq: Every day | ORAL | Status: DC

## 2013-10-29 MED ORDER — GI COCKTAIL ~~LOC~~: 30.0000 mL | Freq: Once | ORAL | Status: DC

## 2013-10-29 NOTE — Progress Notes (Signed)
  Patient name: Dominique [name] MRN [id number]  Date of birth: Jul 20, 1975  CC & HPI:  Dominique [name] is a 38 y.o. female presenting today for Fibromyalgia and reflux.   She continues to endorse burning fully body pain associated with numbness. Recent evaluated by Rheumatologist who agreed symptoms consistent with Fibromyalgia vs Rheumatological process. She has yet to been seen by dermatology for her hand redness and swelling. She reports she never got her new patient package, but chart review shows she missed her apt. Discussed in length fibromyalgia and treatment options. She reports pain improvement with Mobic and tylenol.   GERD - She reports worsening "reflux" - reports waking up several night ago to vomit which hasn't happened since. Denies hematochezia or melena. Continue to take PPI  ROS: Denies fevers, chills, CP or SOB   Objective Findings:  Vitals: BP 115/81  Pulse 87  Temp(Src) 98.6 F (37 C) (Oral)  Ht 4\' 8"  (1.422 m)  Wt 198 lb (89.812 kg)  BMI 44.42 kg/m2  Gen: NAD CV: RRR w/o m/r/g, pulses +2 b/l Resp: CTAB w/ normal respiratory effort Hands: b/l redness to below wrist; No warmth; mild tenderness  Assessment & Plan:   Please See Problem Focused Assessment & Plan

## 2013-10-29 NOTE — Patient Instructions (Signed)
It was great seeing you today.   1. Start Elavil 10 mg at bedtime; call me if you have any problems   Please bring all your medications to every doctors visit  Sign up for My Chart to have easy access to your labs results, and communication with your Primary care physician.  Next Appointment  Please make an appointment with Dr Berkley Harvey in 2-3 weeks    I look forward to talking with you again at our next visit. If you have any questions or concerns before then, please call the clinic at [phone].  Take Care,   Dr Phill Myron

## 2013-10-29 NOTE — Assessment & Plan Note (Signed)
Worsening GERD vs fibromyalgia - Inc PPI to 40mg  daily - If intermittent vomiting continues will refer back to GI for re-eval (Hx of Schatcki ring)

## 2013-10-29 NOTE — Assessment & Plan Note (Signed)
Most likely due to fibromyalgia, but stills needs dermatology evaluation - Apt scheduled for tomorrow and pt advised of this prior to leaving clinic - Restart Elavil at lower dose: 10mg  qhs - plan to titrate this up slowly given previous side-effects "sleepiness" - Encouraged exercise daily - Given Pt info on Fibromyalgia  - f/u in 2-3 weeks

## 2013-11-04 ENCOUNTER — Ambulatory Visit: Payer: No Typology Code available for payment source | Admitting: Vascular Surgery

## 2013-12-11 ENCOUNTER — Encounter: Payer: Self-pay | Admitting: Family Medicine

## 2013-12-11 ENCOUNTER — Ambulatory Visit (INDEPENDENT_AMBULATORY_CARE_PROVIDER_SITE_OTHER): Payer: No Typology Code available for payment source | Admitting: Family Medicine

## 2013-12-11 VITALS — BP 116/80 | HR 84 | Temp 98.6°F | Ht <= 58 in | Wt 198.0 lb

## 2013-12-11 DIAGNOSIS — M797 Fibromyalgia: Secondary | ICD-10-CM | POA: Insufficient documentation

## 2013-12-11 DIAGNOSIS — R443 Hallucinations, unspecified: Secondary | ICD-10-CM

## 2013-12-11 NOTE — Progress Notes (Signed)
Patient ID: Dominique [name], female   DOB: 1975-10-13, 38 y.o.   MRN: [id number]   [facility] Aquilla Hacker, MD Phone: [phone]  Subjective:   # Sudden Episode of Tiredness and Pain - Pt. Reports sudden episode of tiredness 6 days ago, A/V hallucinations including seeing things that weren't there, hearing things that she later realized were not actually happening.  - A/V hallucinations occurred only once - she continues to take her Elavil that she had started 1 month ago.   - Reports significant stress due to having lost parents earlier this year and reports that holidays have been really tough for her.  - Denies depressed mood but admits to poor sleep, increased appetite, decreased enjoyment of activities outside of the home, and says that her coworkers have noted a change in her personality/habits. - Currently denies suicidal ideation or homicidal ideation. - Denies history of depression in the past. - She has been on Elavil for 1 month that was started to treat her fibromyalgia, and she says that it has not helped very much but she has continued to remain compliant taking it daily. - She has never tried an antidepressant. - Denies falling asleep while at work, weight loss, night sweats, palpitations, skin changes, hair changes, not taking Phenergan at this time. - Denies nausea, vomiting, fever, chills, abdominal pain, vision changes, headaches, memory loss, recent illness.   All relevant systems were reviewed and were negative unless otherwise noted in the HPI  Past Medical History Reviewed problem list.  Medications- reviewed and updated Current Outpatient Prescriptions  Medication Sig Dispense Refill  . acetaminophen (TYLENOL ARTHRITIS PAIN) 650 MG CR tablet Take 1 tablet (650 mg total) by mouth 3 (three) times daily. Do not exceed 3000mg  60 tablet 3  . amitriptyline (ELAVIL) 10 MG tablet Take 1 tablet (10 mg total) by mouth at bedtime. 30 tablet 1  .  meloxicam (MOBIC) 15 MG tablet Take 1 tablet (15 mg total) by mouth daily. Take with food 30 tablet 2  . Multiple Vitamin (MULTIVITAMIN WITH MINERALS) TABS Take 1 tablet by mouth daily.    Marland Kitchen omeprazole (PRILOSEC) 20 MG capsule Take 2 capsules (40 mg total) by mouth daily. 30 capsule 3  . polyethylene glycol powder (GLYCOLAX/MIRALAX) powder Take 17 g by mouth 2 (two) times daily as needed. 3350 g 1  . promethazine (PHENERGAN) 25 MG tablet Take 1 tablet (25 mg total) by mouth every 6 (six) hours as needed for nausea. 30 tablet 0  . valACYclovir (VALTREX) 1000 MG tablet Take 2 tablets (2,000 mg total) by mouth 2 (two) times daily. 4 tablet 0   No current facility-administered medications for this visit.   Chief complaint-noted No additions to family history Social history- patient is a non- smoker, but has secondhand smoke exposure  Objective: BP 116/80 mmHg  Pulse 84  Temp(Src) 98.6 F (37 C) (Oral)  Ht 4\' 8"  (1.422 m)  Wt 198 lb (89.812 kg)  BMI 44.42 kg/m2 Gen: NAD, alert, cooperative with exam, obese HEENT: NCAT, EOMI, PERRL Neck: FROM, supple, No LAD, No thyromegaly CV: RRR, good S1/S2, no murmur Resp: CTABL, no wheezes, non-labored Abd: SNTND, BS present, no guarding or organomegaly Ext: No edema noted in lower extremities, warm, normal tone, moves UE/LE spontaneously Neuro: Alert and oriented, No gross deficits Skin: no rashes no lesions  Assessment/Plan: See problem based a/p

## 2013-12-11 NOTE — Assessment & Plan Note (Signed)
A: Difficult to say whether her hallucinations were a result of acute stress due to having recently lost her parents, and admitted stress during the holiday season as a result of this, or recent start of Elavil. At this time only one episode of hallucinations has occurred, and this was in conjunction with symptoms indicative of mild depression. PH Q9 score 6.  P: -Patient to initiate lifestyle changes including diet, exercise, weight loss in order to help with her depressive symptoms and fibromyalgia.  - Two thirds fibromyalgia diagnosed patients have a component of depression. This may be the case with her, and she may have had acute stress leading to hallucinations as a result. - Instructed to call clinic/go to the ED if she experiences hallucinations again, or has thoughts of harming herself or anybody else. -Follow-up with primary care physician in one month.

## 2013-12-11 NOTE — Assessment & Plan Note (Signed)
A: Patient reports continued fiber myalgia symptoms and little benefit from Elavil. At this visit she has had an episode of auditory and visual hallucinations she denies suicidal ideation and homicidal ideation. Does have some component of depression with pH Q9 scoring at 6. May consider additional component of antidepressant in the future. We'll continue Elavil for now given that the patient has not had any further reactions and has continued to take Elavil after her episode of hallucinations. May need to review to increase dose given little benefit in the future if she does not have any further hallucinations.   P: -Patient instructed of lifestyle modifications to improve her symptoms including weight loss, exercise, diet changes. -Continue Elavil for now. -Follow with primary care doctor in 1 month.

## 2013-12-11 NOTE — Patient Instructions (Signed)
Thanks for coming in today!   Please initiate the lifestyle modifications that we discussed, and schedule a visit to return to see Dr. Berkley Harvey in 1 month.   Don't stop taking the Elavil just yet, but if you experience an episode similar to the one on Saturday, then please return to clinic to be seen.   Thanks for letting us take care of you!  Sincerely, Paula Compton, MD Family Medicine - PGY 1

## 2013-12-12 ENCOUNTER — Emergency Department (HOSPITAL_COMMUNITY)
Admission: EM | Admit: 2013-12-12 | Discharge: 2013-12-13 | Disposition: A | Discharge status: Home / Self Care | Payer: No Typology Code available for payment source | Attending: Emergency Medicine | Admitting: Emergency Medicine

## 2013-12-12 ENCOUNTER — Encounter (HOSPITAL_COMMUNITY): Payer: Self-pay | Admitting: *Deleted

## 2013-12-12 ENCOUNTER — Emergency Department (HOSPITAL_COMMUNITY): Payer: No Typology Code available for payment source

## 2013-12-12 DIAGNOSIS — Z8679 Personal history of other diseases of the circulatory system: Secondary | ICD-10-CM | POA: Diagnosis not present

## 2013-12-12 DIAGNOSIS — Z79899 Other long term (current) drug therapy: Secondary | ICD-10-CM | POA: Diagnosis not present

## 2013-12-12 DIAGNOSIS — M25551 Pain in right hip: Secondary | ICD-10-CM | POA: Diagnosis present

## 2013-12-12 DIAGNOSIS — R110 Nausea: Secondary | ICD-10-CM | POA: Diagnosis not present

## 2013-12-12 DIAGNOSIS — Z3202 Encounter for pregnancy test, result negative: Secondary | ICD-10-CM | POA: Insufficient documentation

## 2013-12-12 DIAGNOSIS — R5383 Other fatigue: Secondary | ICD-10-CM | POA: Diagnosis not present

## 2013-12-12 DIAGNOSIS — Z86718 Personal history of other venous thrombosis and embolism: Secondary | ICD-10-CM | POA: Insufficient documentation

## 2013-12-12 DIAGNOSIS — Z91040 Latex allergy status: Secondary | ICD-10-CM | POA: Diagnosis not present

## 2013-12-12 DIAGNOSIS — M25559 Pain in unspecified hip: Secondary | ICD-10-CM

## 2013-12-12 DIAGNOSIS — Z791 Long term (current) use of non-steroidal anti-inflammatories (NSAID): Secondary | ICD-10-CM | POA: Diagnosis not present

## 2013-12-12 DIAGNOSIS — R531 Weakness: Secondary | ICD-10-CM | POA: Insufficient documentation

## 2013-12-12 DIAGNOSIS — K219 Gastro-esophageal reflux disease without esophagitis: Secondary | ICD-10-CM | POA: Insufficient documentation

## 2013-12-12 LAB — URINALYSIS, ROUTINE W REFLEX MICROSCOPIC
Bilirubin Urine: NEGATIVE
Glucose, UA: NEGATIVE mg/dL
Hgb urine dipstick: NEGATIVE
Ketones, ur: NEGATIVE mg/dL
Leukocytes, UA: NEGATIVE
Nitrite: NEGATIVE
Protein, ur: NEGATIVE mg/dL
Specific Gravity, Urine: 1.019 (ref 1.005–1.030)
Urobilinogen, UA: 0.2 mg/dL (ref 0.0–1.0)
pH: 6 (ref 5.0–8.0)

## 2013-12-12 LAB — BASIC METABOLIC PANEL WITH GFR
Anion gap: 15 (ref 5–15)
BUN: 9 mg/dL (ref 6–23)
CO2: 19 meq/L (ref 19–32)
Calcium: 8.8 mg/dL (ref 8.4–10.5)
Chloride: 104 meq/L (ref 96–112)
Creatinine, Ser: 0.63 mg/dL (ref 0.50–1.10)
GFR calc Af Amer: >90 mL/min
GFR calc non Af Amer: >90 mL/min
Glucose, Bld: 85 mg/dL (ref 70–99)
Potassium: 4.3 meq/L (ref 3.7–5.3)
Sodium: 138 meq/L (ref 137–147)

## 2013-12-12 LAB — CBC WITH DIFFERENTIAL/PLATELET
Basophils Absolute: 0 10*3/uL (ref 0.0–0.1)
Basophils Relative: 0 % (ref 0–1)
Eosinophils Absolute: 0.6 10*3/uL (ref 0.0–0.7)
Eosinophils Relative: 6 % — ABNORMAL HIGH (ref 0–5)
HCT: 37.3 % (ref 36.0–46.0)
Hemoglobin: 12.2 g/dL (ref 12.0–15.0)
Lymphocytes Relative: 31 % (ref 12–46)
Lymphs Abs: 3 10*3/uL (ref 0.7–4.0)
MCH: 30.3 pg (ref 26.0–34.0)
MCHC: 32.7 g/dL (ref 30.0–36.0)
MCV: 92.6 fL (ref 78.0–100.0)
Monocytes Absolute: 0.8 10*3/uL (ref 0.1–1.0)
Monocytes Relative: 8 % (ref 3–12)
Neutro Abs: 5.3 10*3/uL (ref 1.7–7.7)
Neutrophils Relative %: 55 % (ref 43–77)
Platelets: 333 10*3/uL (ref 150–400)
RBC: 4.03 MIL/uL (ref 3.87–5.11)
RDW: 13.6 % (ref 11.5–15.5)
WBC: 9.7 10*3/uL (ref 4.0–10.5)

## 2013-12-12 LAB — SALICYLATE LEVEL: Salicylate Lvl: 17.7 mg/dL (ref 2.8–20.0)

## 2013-12-12 LAB — PREGNANCY, URINE: Preg Test, Ur: NEGATIVE

## 2013-12-12 NOTE — ED Notes (Signed)
The pt has been feeling fatigue extreme for one week.  She had rt hip pain with these episodes.  She had one earlier tonight .  Her ears are ringing and she is nauseated.  achihng all over.  lmp none

## 2013-12-12 NOTE — ED Provider Notes (Signed)
CSN: [id number]     Arrival date & time 12/12/13  2035 History   First MD Initiated Contact with Patient 12/12/13 2212     Chief Complaint  Patient presents with  . Hip Pain     (Consider location/radiation/quality/duration/timing/severity/associated sxs/prior Treatment) HPI Comments: Patient is a 38 year old female presenting to the emergency department for one week of intermittent episodes of generalized weakness and fatigue. Patient states she has associated right hip pain and pelvis pain with this. Patient also states her ears began to ring today. No modifying factors identified. She has tried Tylenol for her symptoms with no improvement. She was seen by her PCP yesterday without acute finding. Denies any fevers, chills, vomiting, diarrhea, rash, chest pain, shortness of breath, diarrhea, abdominal pain, headache, syncope.   Past Medical History  Diagnosis Date  . Raynaud's disease   . DVT (deep venous thrombosis)   . Raynaud disease 12/21/2011  . Varicose veins   . GERD (gastroesophageal reflux disease)    Past Surgical History  Procedure Laterality Date  . Abdominal hysterectomy    . Cesarean section    . Appendectomy    . Hernia repair  2006  . Endovenous ablation saphenous vein w/ laser  02-01-2012    right greater saphenous vein and stab phlebectomy 10-20 incisions right leg  by Curt Jews, MD  . Endovenous ablation saphenous vein w/ laser Left 02-29-2012    left greater saphenous vein and stab phlebectomy left leg 10-20 incisions  by Curt Jews MD  . Esophagogastroduodenoscopy (egd) with propofol N/A 03/26/2012    Procedure: ESOPHAGOGASTRODUODENOSCOPY (EGD) WITH PROPOFOL;  Surgeon: Cleotis Nipper, MD;  Location: [facility] END[facility]COPY;  Service: Endoscopy;  Laterality: N/A;  . Colonoscopy with propofol N/A 03/26/2012    Procedure: COLONOSCOPY WITH PROPOFOL;  Surgeon: Cleotis Nipper, MD;  Location: [facility];  Service: Endoscopy;  Laterality: N/A;  . Endovenous ablation  saphenous vein w/ laser Left    Family History  Problem Relation Age of Onset  . Diabetes Mother   . Heart disease Mother   . Hyperlipidemia Mother   . Hypertension Mother   . Other Mother     amputation, varicose veins  . Diabetes Father   . Hyperlipidemia Father   . Hypertension Father   . Peripheral vascular disease Father   . Other Father     varicose veins  . Deep vein thrombosis Brother   . Other Brother     varicose veins   History  Substance Use Topics  . Smoking status: Never Smoker   . Smokeless tobacco: Never Used  . Alcohol Use: No   OB History    No data available     Review of Systems  Constitutional: Positive for fatigue.  Gastrointestinal: Positive for nausea.  Musculoskeletal: Positive for myalgias and arthralgias.  All other systems reviewed and are negative.     Allergies  Contrast media; Latex; Zofran; Eggs or egg-derived products; and Lactose intolerance (gi)  Home Medications   Prior to Admission medications   Medication Sig Start Date End Date Taking? Authorizing Provider  acetaminophen (TYLENOL ARTHRITIS PAIN) 650 MG CR tablet Take 1 tablet (650 mg total) by mouth 3 (three) times daily. Do not exceed 3000mg  03/27/13  Yes Kandis Nab, MD  amitriptyline (ELAVIL) 10 MG tablet Take 1 tablet (10 mg total) by mouth at bedtime. 10/29/13  Yes Olam Idler, MD  meloxicam (MOBIC) 15 MG tablet Take 1 tablet (15 mg total) by mouth daily. Take with  food 10/29/13  Yes Olam Idler, MD  Multiple Vitamin (MULTIVITAMIN WITH MINERALS) TABS Take 1 tablet by mouth daily.   Yes Historical Provider, MD  omeprazole (PRILOSEC) 20 MG capsule Take 2 capsules (40 mg total) by mouth daily. 10/29/13  Yes Olam Idler, MD  polyethylene glycol powder (GLYCOLAX/MIRALAX) powder Take 17 g by mouth 2 (two) times daily as needed. Patient not taking: Reported on 12/12/2013 03/27/13   Kandis Nab, MD  promethazine (PHENERGAN) 25 MG tablet Take 1 tablet (25 mg  total) by mouth every 6 (six) hours as needed for nausea. Patient not taking: Reported on 12/12/2013 09/18/13   Olam Idler, MD  valACYclovir (VALTREX) 1000 MG tablet Take 2 tablets (2,000 mg total) by mouth 2 (two) times daily. Patient not taking: Reported on 12/12/2013 05/07/13   Jayce G Cook, DO   BP 109/71 mmHg  Pulse 92  Temp(Src) 98.5 F (36.9 C)  Resp 18  Ht 4' 7.5" (1.41 m)  Wt 200 lb (90.719 kg)  BMI 45.63 kg/m2  SpO2 99% Physical Exam  Constitutional: She is oriented to person, place, and time. She appears well-developed and well-nourished. No distress.  HENT:  Head: Normocephalic and atraumatic.  Right Ear: External ear normal.  Left Ear: External ear normal.  Nose: Nose normal.  Mouth/Throat: Oropharynx is clear and moist. No oropharyngeal exudate.  Eyes: Conjunctivae and EOM are normal. Pupils are equal, round, and reactive to light.  Neck: Normal range of motion. Neck supple.  Cardiovascular: Normal rate, regular rhythm, normal heart sounds and intact distal pulses.   Pulmonary/Chest: Effort normal and breath sounds normal. No respiratory distress.  Abdominal: Soft. There is no tenderness.  Musculoskeletal: Normal range of motion. She exhibits no edema or tenderness.  No erythema or warmth over either hip or pelvis.  Neurological: She is alert and oriented to person, place, and time. She has normal strength. No cranial nerve deficit. Gait normal. GCS eye subscore is 4. GCS verbal subscore is 5. GCS motor subscore is 6.  Sensation grossly intact.  No pronator drift.  Bilateral heel-knee-shin intact.  Skin: Skin is warm and dry. No rash noted. She is not diaphoretic.  Nursing note and vitals reviewed.   ED Course  Procedures (including critical care time) Medications - No data to display  Labs Review Labs Reviewed  CBC WITH DIFFERENTIAL - Abnormal; Notable for the following:    Eosinophils Relative 6 (*)    All other components within normal limits  BASIC  METABOLIC PANEL  URINALYSIS, ROUTINE W REFLEX MICROSCOPIC  PREGNANCY, URINE  SALICYLATE LEVEL    Imaging Review Dg Hip Complete Right  12/13/2013   CLINICAL DATA:  Right hip pain, no known injury  EXAM: RIGHT HIP - COMPLETE 2+ VIEW  COMPARISON:  None.  FINDINGS: No fracture or dislocation is seen.  The joint spaces are preserved.  Visualized bony pelvis appears intact.  Lower lumbar spine is within normal limits.  IMPRESSION: Negative right hip radiographs.   Electronically Signed   By: Julian Hy M.D.   On: 12/13/2013 00:05     EKG Interpretation None      MDM   Final diagnoses:  Hip pain  Pelvic pain in female    Filed Vitals:   12/12/13 2055  BP: 109/71  Pulse: 92  Temp: 98.5 F (36.9 C)  Resp: 18   Afebrile, NAD, non-toxic appearing, AAOx4.  No neurofocal deficits. No acute physical examination findings. I have reviewed nursing notes, vital signs,  and all appropriate lab and imaging results for this patient. Will have patient follow up with her PCP for re-evaluation. Return precautions discussed. Patient is agreeable to plan. Patient is stable at time of discharge      Harlow Mares, PA-C 12/13/13 0041  Johnna Acosta, MD 12/13/13 0111

## 2013-12-12 NOTE — ED Notes (Signed)
Pt reports she had an episode of sudden onset of tiredness followed by bilateral lateral hip pain earlier this week.  Saw her dr earlier today who told her it was probably wt related but if it happens again to come to ED.  Tonight pt states it occurred again, she laid down, slept for approx 30 min and bilateral lateral hip pain woke her so she came straight to the ED.  Pt was ambulatory to RR for urine sample, no distress noted in her stride.

## 2013-12-13 MED ORDER — PROMETHAZINE HCL 25 MG PO TABS
25.0000 mg | ORAL_TABLET | Freq: Once | ORAL | Status: DC
  Filled 2013-12-13: qty 1

## 2013-12-13 MED ORDER — KETOROLAC TROMETHAMINE 30 MG/ML IJ SOLN: 30.0000 mg | Freq: Once | INTRAMUSCULAR | Status: DC

## 2013-12-13 NOTE — Discharge Instructions (Signed)
Please follow up with your primary care physician in 1-2 days. If you do not have one please call the [place] number listed above. Please read all discharge instructions and return precautions.   Fatigue Fatigue is a feeling of tiredness, lack of energy, lack of motivation, or feeling tired all the time. Having enough rest, good nutrition, and reducing stress will normally reduce fatigue. Consult your caregiver if it persists. The nature of your fatigue will help your caregiver to find out its cause. The treatment is based on the cause.  CAUSES  There are many causes for fatigue. Most of the time, fatigue can be traced to one or more of your habits or routines. Most causes fit into one or more of three general areas. They are: Lifestyle problems  Sleep disturbances.  Overwork.  Physical exertion.  Unhealthy habits.  Poor eating habits or eating disorders.  Alcohol and/or drug use .  Lack of proper nutrition (malnutrition). Psychological problems  Stress and/or anxiety problems.  Depression.  Grief.  Boredom. Medical Problems or Conditions  Anemia.  Pregnancy.  Thyroid gland problems.  Recovery from major surgery.  Continuous pain.  Emphysema or asthma that is not well controlled  Allergic conditions.  Diabetes.  Infections (such as mononucleosis).  Obesity.  Sleep disorders, such as sleep apnea.  Heart failure or other heart-related problems.  Cancer.  Kidney disease.  Liver disease.  Effects of certain medicines such as antihistamines, cough and cold remedies, prescription pain medicines, heart and blood pressure medicines, drugs used for treatment of cancer, and some antidepressants. SYMPTOMS  The symptoms of fatigue include:   Lack of energy.  Lack of drive (motivation).  Drowsiness.  Feeling of indifference to the surroundings. DIAGNOSIS  The details of how you feel help guide your caregiver in finding out what is  causing the fatigue. You will be asked about your present and past health condition. It is important to review all medicines that you take, including prescription and non-prescription items. A thorough exam will be done. You will be questioned about your feelings, habits, and normal lifestyle. Your caregiver may suggest blood tests, urine tests, or other tests to look for common medical causes of fatigue.  TREATMENT  Fatigue is treated by correcting the underlying cause. For example, if you have continuous pain or depression, treating these causes will improve how you feel. Similarly, adjusting the dose of certain medicines will help in reducing fatigue.  HOME CARE INSTRUCTIONS   Try to get the required amount of good sleep every night.  Eat a healthy and nutritious diet, and drink enough water throughout the day.  Practice ways of relaxing (including yoga or meditation).  Exercise regularly.  Make plans to change situations that cause stress. Act on those plans so that stresses decrease over time. Keep your work and personal routine reasonable.  Avoid street drugs and minimize use of alcohol.  Start taking a daily multivitamin after consulting your caregiver. SEEK MEDICAL CARE IF:   You have persistent tiredness, which cannot be accounted for.  You have fever.  You have unintentional weight loss.  You have headaches.  You have disturbed sleep throughout the night.  You are feeling sad.  You have constipation.  You have dry skin.  You have gained weight.  You are taking any new or different medicines that you suspect are causing fatigue.  You are unable to sleep at night.  You develop any unusual swelling of your legs or other parts  of your body. SEEK IMMEDIATE MEDICAL CARE IF:   You are feeling confused.  Your vision is blurred.  You feel faint or pass out.  You develop severe headache.  You develop severe abdominal, pelvic, or back pain.  You develop chest  pain, shortness of breath, or an irregular or fast heartbeat.  You are unable to pass a normal amount of urine.  You develop abnormal bleeding such as bleeding from the rectum or you vomit blood.  You have thoughts about harming yourself or committing suicide.  You are worried that you might harm someone else. MAKE SURE YOU:   Understand these instructions.  Will watch your condition.  Will get help right away if you are not doing well or get worse. Document Released: 10/23/2006 Document Revised: 03/20/2011 Document Reviewed: 04/29/2013 [facility] Patient Information 2015 [place], [place]. This information is not intended to replace advice given to you by your health care provider. Make sure you discuss any questions you have with your health care provider.

## 2013-12-13 NOTE — ED Notes (Signed)
Pt refused medications.  Stated "I just want to go home now."  Reoffered several times, each time pt refused.

## 2014-01-13 ENCOUNTER — Ambulatory Visit (INDEPENDENT_AMBULATORY_CARE_PROVIDER_SITE_OTHER): Payer: Self-pay | Admitting: Family Medicine

## 2014-01-13 ENCOUNTER — Encounter: Payer: Self-pay | Admitting: Family Medicine

## 2014-01-13 VITALS — BP 123/66 | HR 88 | Temp 98.4°F | Ht <= 58 in | Wt 199.9 lb

## 2014-01-13 DIAGNOSIS — L988 Other specified disorders of the skin and subcutaneous tissue: Secondary | ICD-10-CM | POA: Insufficient documentation

## 2014-01-13 DIAGNOSIS — N649 Disorder of breast, unspecified: Secondary | ICD-10-CM

## 2014-01-13 NOTE — Patient Instructions (Signed)
It was nice to see you today.  The area of concern is very superficial and is not a lymph node or mass.  Is likely a resolving superficial skin infection.  You can feel reassured that this is highly unlikely to be cancerous.  Please follow-up to PCP if this fails to completely resolved.  Take care  Dr. Lacinda Axon

## 2014-01-13 NOTE — Assessment & Plan Note (Signed)
No mass/lymph node on exam. Patient is a small erythematous area under her left nipple.  Appears to be a reactive area, likely from prior injury/superficial skin infection. Patient reassured today. Advised her to follow-up with her PCP if this does not resolve.

## 2014-01-13 NOTE — Progress Notes (Signed)
   Subjective:    Patient ID: Dominique [name], female    DOB: 15-Oct-1975, 39 y.o.   MRN: [id number]  HPI 39 year old female presents for same day appointment with complaints of a lump in her left breast.  1) Lump/Mass   Located in the left breast below the nipple.  She states has been there for approximately 3 weeks.  Area is nontender/nonpainful.  No associated drainage.  No reported fevers, chills. No recent weight loss.   Family history significant for grandmother who died of breast cancer.  Review of Systems Per HPI    Objective:   Physical Exam Filed Vitals:   01/13/14 0931  BP: 123/66  Pulse: 88  Temp: 98.4 F (36.9 C)   Exam: General: well appearing, NAD. Breast exam: Left breast - small erythematous superficial area noted below the nipple. Has the appearance of an abscess that has healed.  No palpable lymph nodes/masses.  No nipple changes or discharge.    Assessment & Plan:  See Problem List

## 2014-02-03 ENCOUNTER — Telehealth: Payer: Self-pay | Admitting: Family Medicine

## 2014-02-03 DIAGNOSIS — R52 Pain, unspecified: Secondary | ICD-10-CM

## 2014-02-03 DIAGNOSIS — M797 Fibromyalgia: Secondary | ICD-10-CM

## 2014-02-03 MED ORDER — PROMETHAZINE HCL 25 MG PO TABS: 25.0000 mg | ORAL_TABLET | Freq: Four times a day (QID) | ORAL | Status: DC | PRN

## 2014-02-03 MED ORDER — AMITRIPTYLINE HCL 10 MG PO TABS: 10.0000 mg | ORAL_TABLET | Freq: Every day | ORAL | Status: DC

## 2014-02-03 NOTE — Telephone Encounter (Signed)
Just needs her phenergan.  Has been sick for several days . Will see Dr Cristina Gong next Thursday. Also needs amitriptyline has been out of that for over a month.   Please advise

## 2014-02-07 ENCOUNTER — Encounter (HOSPITAL_COMMUNITY): Payer: Self-pay | Admitting: *Deleted

## 2014-02-07 ENCOUNTER — Emergency Department (HOSPITAL_COMMUNITY)
Admission: EM | Admit: 2014-02-07 | Discharge: 2014-02-07 | Disposition: A | Discharge status: Home / Self Care | Payer: No Typology Code available for payment source | Source: Home / Self Care | Attending: Emergency Medicine | Admitting: Emergency Medicine

## 2014-02-07 DIAGNOSIS — L739 Follicular disorder, unspecified: Secondary | ICD-10-CM

## 2014-02-07 MED ORDER — BENZALKONIUM CHLORIDE EX SOLN: CUTANEOUS | Status: DC

## 2014-02-07 MED ORDER — VALACYCLOVIR HCL 1 G PO TABS: 500.0000 mg | ORAL_TABLET | Freq: Two times a day (BID) | ORAL | Status: DC

## 2014-02-07 MED ORDER — MUPIROCIN 2 % EX OINT
TOPICAL_OINTMENT | CUTANEOUS | Status: DC
Start: 2014-02-07 — End: 2015-12-08

## 2014-02-07 MED ORDER — SULFAMETHOXAZOLE-TRIMETHOPRIM 800-160 MG PO TABS: 2.0000 | ORAL_TABLET | Freq: Two times a day (BID) | ORAL | Status: DC

## 2014-02-07 NOTE — ED Provider Notes (Signed)
   Chief Complaint    Rash   History of Present Illness      Dominique [name] is a 39 year old female who has had a one half month history of 2, small, erythematous papules just below her left breast. This just began as a single nodule. She saw her primary care for the physician for this about a month ago. He did not feel it was anything significant. The first nodule has not healed up and she developed a second nodule in that area as well. She's been exposed to MRSA. She's had some other skin lesions that have gone away on her chest. She feels subjectively feverish and chilled today. She's also had ongoing symptoms of reflux esophagitis and nausea. She's going to see Dr. Cristina Gong for these. She also has a history of herpes labialis. She's been taking acyclovir, but it causes stomach upset.  Review of Systems   Other than as noted above, the patient denies any of the following symptoms: Systemic:  No fever or chills. ENT:  No nasal congestion, rhinorrhea, sore throat, swelling of lips, tongue or throat. Resp:  No cough, wheezing, or shortness of breath.  [place]    Past medical history, family history, social history, meds, and allergies were reviewed.   Physical Exam     Vital signs:  BP 117/69 mmHg  Pulse 86  Temp(Src) 98.9 F (37.2 C) (Oral)  Resp 16  SpO2 100% Gen:  Alert, oriented, in no distress. ENT:  Pharynx clear, no intraoral lesions, moist mucous membranes. Lungs:  Clear to auscultation. Breast exam: No lumps. Skin:  She has 2, small, erythematous papules at the 6:00 position, just below the left breast. These were minimally tender to palpation. They were not pustular. There was no fluctuance or induration. No drainage.  Assessment    The encounter diagnosis was Folliculitis.  Possibly due to MRSA.  Plan     1.  Meds:  The following meds were prescribed:   Discharge Medication List as of 02/07/2014  8:47 PM    START taking these medications   Details    Benzalkonium Chloride SOLN Use as body wash daily for 2 weeks., Normal    mupirocin ointment (BACTROBAN) 2 % Apply to both nostrils TID for 1 month., Normal    sulfamethoxazole-trimethoprim (BACTRIM DS,SEPTRA DS) 800-160 MG per tablet Take 2 tablets by mouth 2 (two) times daily., Starting 02/07/2014, Until Discontinued, Normal    !! valACYclovir (VALTREX) 1000 MG tablet Take 0.5 tablets (500 mg total) by mouth 2 (two) times daily., Starting 02/07/2014, Until Discontinued, Normal     !! - Potential duplicate medications found. Please discuss with provider.      2.  Patient Education/Counseling:  The patient was given appropriate handouts, self care instructions, and instructed in symptomatic relief.  Instructed in a MRSA eradication regimen.  3.  Follow up:  The patient was told to follow up here if no better in 3 to 4 days, or sooner if becoming worse in any way, and given some red flag symptoms such as worsening rash, fever, or difficulty breathing which would prompt immediate return.  Follow up here if necessary.      Harden Mo, MD 02/07/14 2124

## 2014-02-07 NOTE — Discharge Instructions (Signed)
Community-Associated MRSA °CA-MRSA stands for community-associated methicillin-resistant Staphylococcus aureus. MRSA is a type of bacteria that is resistant to some common antibiotics. It can cause infections in the skin and many other places in the body. Staphylococcus aureus, often called "staph," is a bacteria that normally lives on the skin or in the nose. Staph on the surface of the skin or in the nose does not cause problems. However, if the staph enters the body through a cut, wound, or break in the skin, an infection can happen. °Up until recently, infections with the MRSA type of staph mainly occurred in hospitals and other health care settings. There are now increasing problems with MRSA infections in the community as well. Infections with MRSA may be very serious or even life threatening. °CA-MRSA is becoming more common. It is known to spread in crowded settings, in jails and prisons, and in situations where there is close skin-to-skin contact, such as during sporting events or in locker rooms. MRSA can be spread through shared items, such as children's toys, razors, towels, or sports equipment.  °CAUSES °All staph, including MRSA, are normally harmless unless they enter the body through a scratch, cut, or wound, such as with surgery. All staph, including MRSA, can be spread from person-to-person by touching contaminated objects or through direct contact. °· MRSA now causes illness in people who have not been in hospitals or other health care facilities. Cases of MRSA diseases in the community have been associated with: °¨ Recent antibiotic use. °¨ Sharing contaminated towels or clothes. °¨ Having active skin diseases. °¨ Participating in contact sports. °¨ Living in crowded settings. °¨ Intravenous (IV) drug use. °· Community-associated MRSA infections are usually skin infections, but may cause other severe illnesses. °· Staph bacteria are one of the most common causes of skin infection. However, they  are also a common cause of pneumonia, bone or joint infections, and bloodstream infections. °DIAGNOSIS °Diagnosis of MRSA is done by cultures of fluid samples that may come from: °· Swabs taken from cuts or wounds in infected areas. °· Nasal swabs. °· Saliva or deep cough specimens from the lungs (sputum). °· Urine. °· Blood. °Many people are "colonized" with MRSA but have no signs of infection. This means that people carry the MRSA germ on their skin or in their nose and may never develop MRSA infection.  °TREATMENT  °Treatment varies and is based on how serious, how deep, or how extensive the infection is. For example: °· Some skin infections, such as a small boil or abscess, may be treated by draining yellowish-white fluid (pus) from the site of the infection. °· Deeper or more widespread soft tissue infections are usually treated with surgery to drain pus and with antibiotic medicine given by vein or by mouth. This may be recommended even if you are pregnant. °· Serious infections may require a hospital stay. °If antibiotics are given, they may be needed for several weeks. °PREVENTION °Because many people are colonized with staph, including MRSA, preventing the spread of the bacteria from person-to-person is most important. The best way to prevent the spread of bacteria and other germs is through proper hand washing or by using alcohol-based hand disinfectants. The following are other ways to help prevent MRSA infection within community settings.  °· Wash your hands frequently with soap and water for at least 15 seconds. Otherwise, use alcohol-based hand disinfectants when soap and water is not available. °· Make sure people who live with you wash their hands often, too. °·   Do not share personal items. For example, avoid sharing razors and other personal hygiene items, towels, clothing, and athletic equipment. °· Wash and dry your clothes and bedding at the warmest temperatures recommended on the labels. °· Keep  wounds covered. Pus from infected sores may contain MRSA and other bacteria. Keep cuts and abrasions clean and covered with germ-free (sterile), dry bandages until they are healed. °· If you have a wound that appears infected, ask your caregiver if a culture for MRSA and other bacteria should be done. °· If you are breastfeeding, talk to your caregiver about MRSA. You may be asked to temporarily stop breastfeeding. °HOME CARE INSTRUCTIONS  °· Take your antibiotics as directed. Finish them even if you start to feel better. °· Avoid close contact with those around you as much as possible. Do not use towels, razors, toothbrushes, bedding, or other items that will be used by others. °· To fight the infection, follow your caregiver's instructions for wound care. Wash your hands before and after changing your bandages. °· If you have an intravascular device, such as a catheter, make sure you know how to care for it. °· Be sure to tell any health care providers that you have MRSA so they are aware of your infection. °SEEK IMMEDIATE MEDICAL CARE IF: °· The infection appears to be getting worse. Signs include: °¨ Increased warmth, redness, or tenderness around the wound site. °¨ A red line that extends from the infection site. °¨ A dark color in the area around the infection. °¨ Wound drainage that is tan, yellow, or green. °¨ A bad smell coming from the wound. °· You feel sick to your stomach (nauseous) and throw up (vomit) or cannot keep medicine down. °· You have a fever. °· Your baby is older than 3 months with a rectal temperature of 102°F (38.9°C) or higher. °· Your baby is 3 months old or younger with a rectal temperature of 100.4°F (38°C) or higher. °· You have difficulty breathing. °MAKE SURE YOU:  °· Understand these instructions. °· Will watch your condition. °· Will get help right away if you are not doing well or get worse. °Document Released: 03/31/2005 Document Revised: 05/12/2013 Document Reviewed:  03/31/2010 °[employer]® Patient Information ©2015 [employer]. This information is not intended to replace advice given to you by your health care provider. Make sure you discuss any questions you have with your health care provider. ° °

## 2014-02-07 NOTE — ED Notes (Signed)
Pt        Has  2  Small  Red     Lesions  Underneath  l     Breat   That  Has  Been  There  For  Perhaps   A  Month   -  She  Also    A  Refill of  Her   Anti  Viral  meds  For oral   Cold  sore

## 2014-02-13 ENCOUNTER — Encounter: Payer: Self-pay | Admitting: Family Medicine

## 2014-02-13 ENCOUNTER — Ambulatory Visit (INDEPENDENT_AMBULATORY_CARE_PROVIDER_SITE_OTHER): Payer: No Typology Code available for payment source | Admitting: Family Medicine

## 2014-02-13 VITALS — BP 106/61 | HR 81 | Temp 98.4°F | Ht <= 58 in | Wt 200.9 lb

## 2014-02-13 DIAGNOSIS — B9789 Other viral agents as the cause of diseases classified elsewhere: Secondary | ICD-10-CM

## 2014-02-13 DIAGNOSIS — J028 Acute pharyngitis due to other specified organisms: Secondary | ICD-10-CM

## 2014-02-13 DIAGNOSIS — N649 Disorder of breast, unspecified: Secondary | ICD-10-CM

## 2014-02-13 DIAGNOSIS — J029 Acute pharyngitis, unspecified: Secondary | ICD-10-CM

## 2014-02-13 DIAGNOSIS — L988 Other specified disorders of the skin and subcutaneous tissue: Secondary | ICD-10-CM

## 2014-02-13 MED ORDER — MELOXICAM 15 MG PO TABS
15.0000 mg | ORAL_TABLET | Freq: Every day | ORAL | Status: DC
Start: 2014-02-13 — End: 2014-02-24

## 2014-02-13 NOTE — Progress Notes (Signed)
   Subjective:    Patient ID: Dominique [name], female    DOB: Jul 08, 1975, 39 y.o.   MRN: [id number]  HPI  MRSA infection: Patient has been to the family medicine clinic approximately 1-1/2 months ago for a small pimple-like lesion, that has not improved with time. Last week she went to the urgent care once lesion became larger, and she noticed a second lesion. 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 She has not had boils or lesions over her body and the past, but has noticed over the last few months she is having small pimple-like lesions mostly over her chest. She currently has a very small pimple lesion above her right breast, and 3 small pimple-like lesions below her right breast. She also has 3 lesions below her left breast 2 of which are healed in approximately half of a centimeter in diameter, the other is a small pimple-like lesion. He denies any fevers, or lesions in any other place on her body. Taking the antibiotic as directed, and using the Bactroban ointment in her nose. She denies any pain or drainage from the lesions.  Sore throat: Patient complains of a sore throat for the last 2 days. She does have a herpes simplex lesion on her lower right portion of her lip. She is taking an antiviral medication. She lives alone, no sick contacts. No fever, nausea, vomiting, headache, congestion, rhinorrhea or diarrhea.  Nonsmoker Past Medical History  Diagnosis Date  . Raynaud's disease   . DVT (deep venous thrombosis)   . Raynaud disease 12/21/2011  . Varicose veins   . GERD (gastroesophageal reflux disease)    Allergies  Allergen Reactions  . Contrast Media [Iodinated Diagnostic Agents] Anaphylaxis and Swelling    Tongue swelling  . Latex Anaphylaxis  . Zofran [Ondansetron Hcl] Other (See Comments)    Hives, numbness of legs with previous use  . Eggs Or Egg-Derived Products Nausea And Vomiting  . Lactose  Intolerance (Gi) Diarrhea    Review of Systems Per history of present illness    Objective:   Physical Exam BP 106/61 mmHg  Pulse 81  Temp(Src) 98.4 F (36.9 C) (Oral)  Ht 4\' 8"  (1.422 m)  Wt 200 lb 14.4 oz (91.128 kg)  BMI 45.07 kg/m2 Gen: Pleasant, Caucasian female, no acute distress, nontoxic in appearance, well-developed, well-nourished, obese HEENT: AT. [place]. Bilateral TM visualized and normal in appearance. Bilateral eyes without injections or icterus. MMM. Bilateral nares without erythema or swelling. Throat without erythema or exudates, or lesions. Small ulceration right side of lower lip Skin: One small raised pimple-like area with erythema above right breast, with additional 3 small raised temple-like area below her right breast. Left breast with 2 healing half centimeter small abscess, with additional one small pimple-like area. No drainage noted.     Assessment & Plan:

## 2014-02-13 NOTE — Patient Instructions (Signed)
MRSA Infection MRSA stands for methicillin-resistant Staphylococcus aureus. This type of infection is caused by Staphylococcus aureus bacteria that are no longer affected by the medicines used to kill them (drug resistant). Staphylococcus (staph) bacteria are normally found on the skin or in the nose of healthy people. In most cases, these bacteria do not cause infection. But if these resistant bacteria enter your body through a cut or sore, they can cause a serious infection on your skin or in other parts of your body. There is a slight chance that the staph on your skin or in your nose is MRSA. There are two types of MRSA infections:  Hospital-acquired MRSA is bacteria that you get in the hospital.  Community-acquired MRSA is bacteria that you get somewhere other than in a hospital. RISK FACTORS Hospital-acquired MRSA is more common. You could be at risk for this infection if you are in the hospital and you:  Have surgery or a procedure.  Have an IV access or a catheter tube placed in your body.  Have weak resistance to germs (weakened immune system).  Are elderly.  Are on kidney dialysis. You could be at risk for community-acquired MRSA if you have a break in your skin and come into contact with MRSA. This may happen if you:  Play sports where there is skin-to-skin contact.  Live in a crowded setting, like a dormitory or a military barracks.  Share towels, razors, or sports equipment with other people. SYMPTOMS  Symptoms of hospital-acquired MRSA depend on where MRSA has spread. Symptoms may include:  Wound infection.  Skin infection.  Rash.  Pneumonia.  Fever and chills.  Difficulty breathing.  Chest pain. Community-acquired MRSA is most likely to start as a scratch or cut that becomes infected. Symptoms may include:  A pus-filled pimple.  A boil on your skin.  Pus draining from your skin.  A sore (abscess) under your skin or somewhere in your body.  Fever  with or without chills. DIAGNOSIS  The diagnosis of MRSA is made by taking a sample from an infected area and sending it to a lab for testing. A lab technician can grow (culture) MRSA and check it under a microscope. The cultured MRSA can be tested to see which type of antibiotic medicine will work to treat it. Newer tests can identify MRSA more quickly by testing bacteria samples for MRSA genes. Your health care provider can diagnose MRSA using samples from:   Cuts or wounds in infected areas.  Nasal swabs.  Saliva or cough specimens from deep in the lungs (sputum).  Urine.  Blood. You may also have:  Imaging studies (such as X-ray or MRI) to check if the infection has spread to the lungs, bones, or joints.  A culture and sensitivity test of blood or fluids from inside the joints. TREATMENT  Treatment depends on how severe, deep, or extensive the infection is. Very bad infections may require a hospital stay.  Some skin infections, such as a small boil or sore (abscess), may be treated by draining pus from the site of the infection.  More extensive surgery to drain pus may be necessary for deeper or more widespread soft tissue infections.  You may then have to take antibiotic medicine given by mouth or through a vein. You may start antibiotic treatment right away or after testing can be done to see what antibiotic medicine should be used. HOME CARE INSTRUCTIONS   Take your antibiotics as directed by your health care provider. Take   the medicine as prescribed until it is finished.  Avoid close contact with those around you as much as possible. Do not use towels, razors, toothbrushes, bedding, or other items that will be used by others.  Wash your hands frequently for 15 seconds with soap and water. Dry your hands with a clean or disposable towel.  When you are not able to wash your hands, use hand sanitizer that is more than 60 percent alcohol.  Wash towels, sheets, or clothes in  the washing machine with detergent and hot water. Dry them in a hot dryer.  Follow your health care provider's instructions for wound care. Wash your hands before and after changing your bandages.  Always shower after exercising.  Keep all cuts and scrapes clean and covered with a bandage.  Be sure to tell all your health care providers that you have MRSA so they are aware of your infection. SEEK MEDICAL CARE IF:  You have a cut, scrape, pimple, or boil that becomes red, swollen, or painful or has pus in it.  You have pus draining from your skin.  You have an abscess under your skin or somewhere in your body. SEEK IMMEDIATE MEDICAL CARE IF:   You have symptoms of a skin infection with a fever or chills.  You have trouble breathing.  You have chest pain.  You have a skin wound and you become nauseous or start vomiting. MAKE SURE YOU:  Understand these instructions.  Will watch your condition.  Will get help right away if you are not doing well or get worse. Document Released: 12/26/2004 Document Revised: 12/31/2012 Document Reviewed: 10/18/2012 [facility] Patient Information 2015 [place], [place]. This information is not intended to replace advice given to you by your health care provider. Make sure you discuss any questions you have with your health care provider.   Continue the antibiotics until they're completed. Apply the Bactroban ointment in your nose, underneath her fingernails and over any of your open wounds. Use a fresh Q-tip to apply ointment each time you use it. Once your wash comes and, be certain to follow directions and use it as well. I would wash your bras in hot water, or attempt to purchase new bra. I would buy some gauze and line the inside of your bra and apply Bactroban ointment to it as well.  Make certain to wash all your sheets and pillows in hot water. I would like to see you in 2-3 weeks to make sure that it is resolving. I recommend

## 2014-02-13 NOTE — Assessment & Plan Note (Signed)
Patient with what sounds like a MRSA infection underneath her breast. She is being appropriately treated with Bactroban ointment, Bactrim and Hibiclens wash. Advised patient today that she should wash all of her sheets and pillows in the house. In addition she is to wash her bras in hot water. Encouraged her to buy some gauze placed inside of bra cups with Bactroban over it. She is also to apply Bactroban to all of the small lesions which are 3 on the left breast to an the right breast. She is to apply Bactroban to her nasal passages and fingernails daily. She was discouraged from scratching the areas. Would like to follow-up in about 2-3 weeks to make certain they are healing well.

## 2014-02-13 NOTE — Assessment & Plan Note (Signed)
Patient complained of sore throat today, on exam did not see anything of concern, no thrush or lesions, no erythema. 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 Possibly viral pharyngitis, advised patient to use Chloraseptic gargle warm salt water for comfort. Return if no improvement in 1-2 weeks.

## 2014-02-19 ENCOUNTER — Other Ambulatory Visit: Payer: Self-pay | Admitting: Family Medicine

## 2014-02-19 DIAGNOSIS — K2970 Gastritis, unspecified, without bleeding: Secondary | ICD-10-CM

## 2014-02-20 ENCOUNTER — Telehealth: Payer: Self-pay | Admitting: Family Medicine

## 2014-02-20 NOTE — Telephone Encounter (Signed)
Pt called because he MRSA is still spreading and the medication is not helping. Please call to discuss. jw

## 2014-02-20 NOTE — Telephone Encounter (Signed)
I'll defer to you since I haven't seen it

## 2014-02-21 ENCOUNTER — Encounter (HOSPITAL_COMMUNITY): Payer: Self-pay | Admitting: *Deleted

## 2014-02-21 ENCOUNTER — Inpatient Hospital Stay (HOSPITAL_COMMUNITY)
Admission: EM | Admit: 2014-02-21 | Discharge: 2014-02-24 | DRG: 379 | Disposition: A | Discharge status: Home / Self Care | Payer: No Typology Code available for payment source | Attending: Family Medicine | Admitting: Family Medicine

## 2014-02-21 DIAGNOSIS — K222 Esophageal obstruction: Secondary | ICD-10-CM | POA: Diagnosis present

## 2014-02-21 DIAGNOSIS — Z8719 Personal history of other diseases of the digestive system: Secondary | ICD-10-CM | POA: Insufficient documentation

## 2014-02-21 DIAGNOSIS — Z8249 Family history of ischemic heart disease and other diseases of the circulatory system: Secondary | ICD-10-CM

## 2014-02-21 DIAGNOSIS — B001 Herpesviral vesicular dermatitis: Secondary | ICD-10-CM | POA: Diagnosis present

## 2014-02-21 DIAGNOSIS — K2970 Gastritis, unspecified, without bleeding: Secondary | ICD-10-CM

## 2014-02-21 DIAGNOSIS — K922 Gastrointestinal hemorrhage, unspecified: Secondary | ICD-10-CM | POA: Diagnosis not present

## 2014-02-21 DIAGNOSIS — L739 Follicular disorder, unspecified: Secondary | ICD-10-CM | POA: Diagnosis present

## 2014-02-21 DIAGNOSIS — R1115 Cyclical vomiting syndrome unrelated to migraine: Secondary | ICD-10-CM

## 2014-02-21 DIAGNOSIS — M797 Fibromyalgia: Secondary | ICD-10-CM | POA: Diagnosis present

## 2014-02-21 DIAGNOSIS — K648 Other hemorrhoids: Secondary | ICD-10-CM | POA: Diagnosis present

## 2014-02-21 DIAGNOSIS — Z86718 Personal history of other venous thrombosis and embolism: Secondary | ICD-10-CM | POA: Insufficient documentation

## 2014-02-21 DIAGNOSIS — K219 Gastro-esophageal reflux disease without esophagitis: Secondary | ICD-10-CM | POA: Diagnosis present

## 2014-02-21 DIAGNOSIS — Z79899 Other long term (current) drug therapy: Secondary | ICD-10-CM

## 2014-02-21 DIAGNOSIS — I7300 Raynaud's syndrome without gangrene: Secondary | ICD-10-CM | POA: Diagnosis present

## 2014-02-21 DIAGNOSIS — R1310 Dysphagia, unspecified: Secondary | ICD-10-CM | POA: Diagnosis present

## 2014-02-21 DIAGNOSIS — E669 Obesity, unspecified: Secondary | ICD-10-CM | POA: Diagnosis present

## 2014-02-21 LAB — COMPREHENSIVE METABOLIC PANEL
ALBUMIN: 3.4 g/dL — AB (ref 3.5–5.2)
ALK PHOS: 90 U/L (ref 39–117)
ALT: 13 U/L (ref 0–35)
ANION GAP: 11 (ref 5–15)
AST: 23 U/L (ref 0–37)
BILIRUBIN TOTAL: 0.3 mg/dL (ref 0.3–1.2)
BUN: 17 mg/dL (ref 6–23)
CHLORIDE: 105 mmol/L (ref 96–112)
CO2: 21 mmol/L (ref 19–32)
CREATININE: 0.8 mg/dL (ref 0.50–1.10)
Calcium: 8.7 mg/dL (ref 8.4–10.5)
GLUCOSE: 123 mg/dL — AB (ref 70–99)
Potassium: 3.9 mmol/L (ref 3.5–5.1)
SODIUM: 137 mmol/L (ref 135–145)
Total Protein: 7.2 g/dL (ref 6.0–8.3)

## 2014-02-21 LAB — CBC WITH DIFFERENTIAL/PLATELET
Basophils Absolute: 0 10*3/uL (ref 0.0–0.1)
Basophils Relative: 0 % (ref 0–1)
Eosinophils Absolute: 0 10*3/uL (ref 0.0–0.7)
Eosinophils Relative: 0 % (ref 0–5)
HCT: 36.4 % (ref 36.0–46.0)
Hemoglobin: 11.7 g/dL — ABNORMAL LOW (ref 12.0–15.0)
Lymphocytes Relative: 4 % — ABNORMAL LOW (ref 12–46)
Lymphs Abs: 0.4 10*3/uL — ABNORMAL LOW (ref 0.7–4.0)
MCH: 29.2 pg (ref 26.0–34.0)
MCHC: 32.1 g/dL (ref 30.0–36.0)
MCV: 90.8 fL (ref 78.0–100.0)
MONO ABS: 0.2 10*3/uL (ref 0.1–1.0)
MONOS PCT: 2 % — AB (ref 3–12)
Neutro Abs: 8.4 10*3/uL — ABNORMAL HIGH (ref 1.7–7.7)
Neutrophils Relative %: 94 % — ABNORMAL HIGH (ref 43–77)
Platelets: 440 10*3/uL — ABNORMAL HIGH (ref 150–400)
RBC: 4.01 MIL/uL (ref 3.87–5.11)
RDW: 14.1 % (ref 11.5–15.5)
WBC: 9 10*3/uL (ref 4.0–10.5)

## 2014-02-21 LAB — RETICULOCYTES
RBC.: 4.01 MIL/uL (ref 3.87–5.11)
Retic Count, Absolute: 48.1 10*3/uL (ref 19.0–186.0)
Retic Ct Pct: 1.2 % (ref 0.4–3.1)

## 2014-02-21 LAB — PROTIME-INR
INR: 1.11 (ref 0.00–1.49)
Prothrombin Time: 14.4 seconds (ref 11.6–15.2)

## 2014-02-21 LAB — APTT: aPTT: 26 seconds (ref 24–37)

## 2014-02-21 LAB — POC OCCULT BLOOD, ED: Fecal Occult Bld: POSITIVE — AB

## 2014-02-21 MED ORDER — DOXYCYCLINE HYCLATE 100 MG PO TABS: 100.0000 mg | ORAL_TABLET | Freq: Two times a day (BID) | ORAL | Status: DC

## 2014-02-21 MED ORDER — SODIUM CHLORIDE 0.9 % IV BOLUS (SEPSIS)
1000.0000 mL | Freq: Once | INTRAVENOUS | Status: AC
  Administered 2014-02-21: 1000 mL via INTRAVENOUS

## 2014-02-21 MED ORDER — FAMOTIDINE IN NACL 20-0.9 MG/50ML-% IV SOLN
20.0000 mg | INTRAVENOUS | Status: AC
  Administered 2014-02-21: 20 mg via INTRAVENOUS
  Filled 2014-02-21: qty 50

## 2014-02-21 MED ORDER — PANTOPRAZOLE SODIUM 40 MG IV SOLR
40.0000 mg | INTRAVENOUS | Status: AC
  Administered 2014-02-21: 40 mg via INTRAVENOUS
  Filled 2014-02-21: qty 40

## 2014-02-21 NOTE — ED Notes (Signed)
Attempted to start IV unsuccessfully  IV team consult placed

## 2014-02-21 NOTE — ED Notes (Signed)
IV team RN unable to collect blood PIV site was placed Phlebotomy called and made aware of pending labs

## 2014-02-21 NOTE — ED Notes (Signed)
Pt arrives to the ER via EMS for complaints of nausea and vomiting x 1 after starting antibiotics for MRSA; pt states that she has been nauseous for 2 weeks; pt states that she has been vomiting today; pt reports 3 episodes of vomiting and reports that it was "black"; pt states that she has noted bright red blood when she wipes; pt c/o gen weakness and "not feeling well"

## 2014-02-21 NOTE — ED Notes (Signed)
MD at bedside. 

## 2014-02-21 NOTE — ED Notes (Signed)
IV team at bedside 

## 2014-02-21 NOTE — ED Provider Notes (Signed)
CSN: [id number]     Arrival date & time 02/21/14  2033 History   First MD Initiated Contact with Patient 02/21/14 2111     Chief Complaint  Patient presents with  . GI Bleeding     (Consider location/radiation/quality/duration/timing/severity/associated sxs/prior Treatment) HPI Comments: Pt is a pleasant 39 y/o female who presents with one day of abnormal stools - she states that she has a history of schatzki's ring, has had dilation by Dr. Cristina Gong in the past - she has had endoscopy in the past - not sure if she has had PUD / gastritis - she is on daily antacids and has been using phenergan for nausea for months - she reports that her doctors do not know why she has it.  She was recently placed on Bactrim for some Skin MRSA - has been doing well.  Today she had some abdominal discomfort and had some BRB when she wiped this AM - she has vomitted several times today which appeared as black stomach acid and smelled feculent per the pt.  She has had multiple large amount of black stool as well just prior to arrival - she was near syncopal prior to arrival as well.  She has had ongoing sx, nothing makes better, worse with standing.   The history is provided by the patient.    Past Medical History  Diagnosis Date  . Raynaud's disease   . DVT (deep venous thrombosis)   . Raynaud disease 12/21/2011  . Varicose veins   . GERD (gastroesophageal reflux disease)    Past Surgical History  Procedure Laterality Date  . Abdominal hysterectomy    . Cesarean section    . Appendectomy    . Hernia repair  2006  . Endovenous ablation saphenous vein w/ laser  02-01-2012    right greater saphenous vein and stab phlebectomy 10-20 incisions right leg  by Curt Jews, MD  . Endovenous ablation saphenous vein w/ laser Left 02-29-2012    left greater saphenous vein and stab phlebectomy left leg 10-20 incisions  by Curt Jews MD  . Esophagogastroduodenoscopy (egd) with propofol N/A 03/26/2012    Procedure:  ESOPHAGOGASTRODUODENOSCOPY (EGD) WITH PROPOFOL;  Surgeon: Cleotis Nipper, MD;  Location: [facility] END[facility]COPY;  Service: Endoscopy;  Laterality: N/A;  . Colonoscopy with propofol N/A 03/26/2012    Procedure: COLONOSCOPY WITH PROPOFOL;  Surgeon: Cleotis Nipper, MD;  Location: [facility] ENDOSCOPY;  Service: Endoscopy;  Laterality: N/A;  . Endovenous ablation saphenous vein w/ laser Left    Family History  Problem Relation Age of Onset  . Diabetes Mother   . Heart disease Mother   . Hyperlipidemia Mother   . Hypertension Mother   . Other Mother     amputation, varicose veins  . Diabetes Father   . Hyperlipidemia Father   . Hypertension Father   . Peripheral vascular disease Father   . Other Father     varicose veins  . Deep vein thrombosis Brother   . Other Brother     varicose veins   History  Substance Use Topics  . Smoking status: Never Smoker   . Smokeless tobacco: Never Used  . Alcohol Use: No   OB History    No data available     Review of Systems  All other systems reviewed and are negative.     Allergies  Contrast media; Latex; Zofran; Eggs or egg-derived products; and Lactose intolerance (gi)  Home Medications   Prior to Admission medications   Medication Sig  Start Date End Date Taking? Authorizing Provider  acetaminophen (TYLENOL) 325 MG tablet Take 650 mg by mouth every 6 (six) hours as needed for moderate pain.   Yes Historical Provider, MD  amitriptyline (ELAVIL) 10 MG tablet Take 1 tablet (10 mg total) by mouth at bedtime. Patient taking differently: Take 10 mg by mouth at bedtime as needed for sleep.  02/03/14  Yes Olam Idler, MD  doxycycline (VIBRA-TABS) 100 MG tablet Take 1 tablet (100 mg total) by mouth 2 (two) times daily. 02/21/14  Yes Renee A Kuneff, DO  hyoscyamine (LEVSIN SL) 0.125 MG SL tablet Take 0.125 mg by mouth every 6 (six) hours as needed for cramping.  02/19/14  Yes Historical Provider, MD  meloxicam (MOBIC) 15 MG tablet Take 1 tablet (15 mg total)  by mouth daily. Take with food 02/13/14  Yes Renee A Kuneff, DO  Multiple Vitamin (MULTIVITAMIN WITH MINERALS) TABS Take 1 tablet by mouth daily.   Yes Historical Provider, MD  mupirocin ointment (BACTROBAN) 2 % Apply to both nostrils TID for 1 month. 02/07/14  Yes Harden Mo, MD  omeprazole (PRILOSEC) 20 MG capsule TAKE ONE CAPSULE BY MOUTH ONCE DAILY 02/19/14  Yes Olam Idler, MD  promethazine (PHENERGAN) 25 MG tablet Take 1 tablet (25 mg total) by mouth every 6 (six) hours as needed for nausea. 02/03/14  Yes Olam Idler, MD  sulfamethoxazole-trimethoprim (BACTRIM DS,SEPTRA DS) 800-160 MG per tablet Take 2 tablets by mouth 2 (two) times daily. 02/07/14  Yes Harden Mo, MD  acetaminophen (TYLENOL ARTHRITIS PAIN) 650 MG CR tablet Take 1 tablet (650 mg total) by mouth 3 (three) times daily. Do not exceed 3000mg  Patient not taking: Reported on 02/21/2014 03/27/13   Kandis Nab, MD  Benzalkonium Chloride SOLN Use as body wash daily for 2 weeks. 02/07/14   Harden Mo, MD  omeprazole (PRILOSEC) 20 MG capsule Take 2 capsules (40 mg total) by mouth daily. Patient not taking: Reported on 02/21/2014 10/29/13   Olam Idler, MD  polyethylene glycol powder (GLYCOLAX/MIRALAX) powder Take 17 g by mouth 2 (two) times daily as needed. Patient not taking: Reported on 12/12/2013 03/27/13   Kandis Nab, MD  valACYclovir (VALTREX) 1000 MG tablet Take 2 tablets (2,000 mg total) by mouth 2 (two) times daily. Patient not taking: Reported on 12/12/2013 05/07/13   Coral Spikes, DO  valACYclovir (VALTREX) 1000 MG tablet Take 0.5 tablets (500 mg total) by mouth 2 (two) times daily. Patient not taking: Reported on 02/21/2014 02/07/14   Harden Mo, MD   BP 116/72 mmHg  Pulse 103  Temp(Src) 99.5 F (37.5 C) (Oral)  Resp 13  Ht 4\' 7"  (1.397 m)  Wt 198 lb (89.812 kg)  BMI 46.02 kg/m2  SpO2 97% Physical Exam  Constitutional: She appears well-developed and well-nourished. No distress.  HENT:  Head:  Normocephalic and atraumatic.  Mouth/Throat: Oropharynx is clear and moist. No oropharyngeal exudate.  Eyes: Conjunctivae and EOM are normal. Pupils are equal, round, and reactive to light. Right eye exhibits no discharge. Left eye exhibits no discharge. No scleral icterus.  Neck: Normal range of motion. Neck supple. No JVD present. No thyromegaly present.  Cardiovascular: Regular rhythm, normal heart sounds and intact distal pulses.  Exam reveals no gallop and no friction rub.   No murmur heard. tachycardia  Pulmonary/Chest: Effort normal and breath sounds normal. No respiratory distress. She has no wheezes. She has no rales.  Abdominal: Soft. Bowel sounds are  normal. She exhibits no distension and no mass. There is no tenderness.  Genitourinary:  No external hemorrhoid or fissue - rectal vault is empty - small amount of mucoid stool.  No masses or ttp  Chaperone present for exam  Musculoskeletal: Normal range of motion. She exhibits no edema or tenderness.  Lymphadenopathy:    She has no cervical adenopathy.  Neurological: She is alert. Coordination normal.  Skin: Skin is warm and dry. No rash noted. No erythema.  Psychiatric: She has a normal mood and affect. Her behavior is normal.  Nursing note and vitals reviewed.   ED Course  Procedures (including critical care time) Labs Review Labs Reviewed  CBC WITH DIFFERENTIAL/PLATELET - Abnormal; Notable for the following:    Hemoglobin 11.7 (*)    Platelets 440 (*)    Neutrophils Relative % 94 (*)    Neutro Abs 8.4 (*)    Lymphocytes Relative 4 (*)    Lymphs Abs 0.4 (*)    Monocytes Relative 2 (*)    All other components within normal limits  COMPREHENSIVE METABOLIC PANEL - Abnormal; Notable for the following:    Glucose, Bld 123 (*)    Albumin 3.4 (*)    All other components within normal limits  POC OCCULT BLOOD, ED - Abnormal; Notable for the following:    Fecal Occult Bld POSITIVE (*)    All other components within normal  limits  APTT  PROTIME-INR  RETICULOCYTES  VITAMIN B12  FOLATE  IRON AND TIBC  FERRITIN    Imaging Review No results found.   MDM   Final diagnoses:  Upper GI bleed    The pt is possibly having a GI bleed - she is tachycardic and seemingly orthostatic by history - check labs, Pepcid, protonix and fluids.  She has a non tender abd at this time.  Angiocath insertion Performed by: Johnna Acosta  Consent: Verbal consent obtained. Risks and benefits: risks, benefits and alternatives were discussed Time out: Immediately prior to procedure a "time out" was called to verify the correct patient, procedure, equipment, support staff and site/side marked as required.  Preparation: Patient was prepped and draped in the usual sterile fashion.  Vein Location: R AC  Ultrasound Guided  Gauge: 20  Normal blood return and flush without difficulty Patient tolerance: Patient tolerated the procedure well with no immediate complications.   Hemoccult positive, VS with improving tachycardia but still present.  D/w FP resident who will accept transfer to [place] - pt in agreement, likely UGI bleed  Meds given in ED:  Medications  sodium chloride 0.9 % bolus 1,000 mL (0 mLs Intravenous Stopped 02/22/14 0013)  famotidine (PEPCID) IVPB 20 mg (0 mg Intravenous Stopped 02/22/14 0013)  pantoprazole (PROTONIX) injection 40 mg (40 mg Intravenous Given 02/21/14 2236)    New Prescriptions   No medications on file        Johnna Acosta, MD 02/22/14 [phone]

## 2014-02-21 NOTE — Telephone Encounter (Signed)
I will call in doxycycline. She needs to be encouraged to continue with the Bactroban ointment and bath with the body wash solution given at the urgent care. If this continues to spread, she will need to be seen again preferably in our clinic. However if after hours she can be seen in urgent care. Thank you

## 2014-02-21 NOTE — ED Notes (Signed)
Medications not given due to unsuccessful attempts by nursing staff to place PIV site IV team consult placed by ED Charge nurse Dr. Sabra Heck made aware and medications will be given when PIV site placed

## 2014-02-22 ENCOUNTER — Encounter (HOSPITAL_COMMUNITY): Payer: Self-pay

## 2014-02-22 ENCOUNTER — Inpatient Hospital Stay (HOSPITAL_COMMUNITY): Payer: No Typology Code available for payment source

## 2014-02-22 DIAGNOSIS — Z8719 Personal history of other diseases of the digestive system: Secondary | ICD-10-CM | POA: Insufficient documentation

## 2014-02-22 DIAGNOSIS — K219 Gastro-esophageal reflux disease without esophagitis: Secondary | ICD-10-CM | POA: Diagnosis present

## 2014-02-22 DIAGNOSIS — K922 Gastrointestinal hemorrhage, unspecified: Secondary | ICD-10-CM | POA: Diagnosis present

## 2014-02-22 DIAGNOSIS — R1310 Dysphagia, unspecified: Secondary | ICD-10-CM | POA: Diagnosis present

## 2014-02-22 DIAGNOSIS — L739 Follicular disorder, unspecified: Secondary | ICD-10-CM | POA: Diagnosis present

## 2014-02-22 DIAGNOSIS — K222 Esophageal obstruction: Secondary | ICD-10-CM | POA: Diagnosis present

## 2014-02-22 DIAGNOSIS — I7300 Raynaud's syndrome without gangrene: Secondary | ICD-10-CM | POA: Diagnosis present

## 2014-02-22 DIAGNOSIS — B001 Herpesviral vesicular dermatitis: Secondary | ICD-10-CM | POA: Diagnosis present

## 2014-02-22 DIAGNOSIS — Z86718 Personal history of other venous thrombosis and embolism: Secondary | ICD-10-CM

## 2014-02-22 DIAGNOSIS — M797 Fibromyalgia: Secondary | ICD-10-CM | POA: Diagnosis present

## 2014-02-22 DIAGNOSIS — Z8249 Family history of ischemic heart disease and other diseases of the circulatory system: Secondary | ICD-10-CM | POA: Diagnosis not present

## 2014-02-22 DIAGNOSIS — Z79899 Other long term (current) drug therapy: Secondary | ICD-10-CM | POA: Diagnosis not present

## 2014-02-22 LAB — PROTIME-INR
INR: 1.1 (ref 0.00–1.49)
Prothrombin Time: 14.4 seconds (ref 11.6–15.2)

## 2014-02-22 LAB — COMPREHENSIVE METABOLIC PANEL
ALT: 10 U/L (ref 0–35)
AST: 16 U/L (ref 0–37)
Albumin: 2.5 g/dL — ABNORMAL LOW (ref 3.5–5.2)
Alkaline Phosphatase: 71 U/L (ref 39–117)
Anion gap: 8 (ref 5–15)
BUN: 12 mg/dL (ref 6–23)
CALCIUM: 7.9 mg/dL — AB (ref 8.4–10.5)
CO2: 20 mmol/L (ref 19–32)
Chloride: 110 mmol/L (ref 96–112)
Creatinine, Ser: 0.66 mg/dL (ref 0.50–1.10)
GFR calc Af Amer: >90 mL/min (ref >90)
Glucose, Bld: 88 mg/dL (ref 70–99)
Potassium: 3.7 mmol/L (ref 3.5–5.1)
Sodium: 138 mmol/L (ref 135–145)
TOTAL PROTEIN: 5.4 g/dL — AB (ref 6.0–8.3)
Total Bilirubin: 0.3 mg/dL (ref 0.3–1.2)

## 2014-02-22 LAB — CBC
HCT: 57.4 % — ABNORMAL HIGH (ref 36.0–46.0)
HCT: 36.1 % (ref 36.0–46.0)
HEMOGLOBIN: 20.1 g/dL — AB (ref 12.0–15.0)
Hemoglobin: 11.5 g/dL — ABNORMAL LOW (ref 12.0–15.0)
MCH: 30.9 pg (ref 26.0–34.0)
MCH: 29.3 pg (ref 26.0–34.0)
MCHC: 35 g/dL (ref 30.0–36.0)
MCHC: 31.9 g/dL (ref 30.0–36.0)
MCV: 88.3 fL (ref 78.0–100.0)
MCV: 92.1 fL (ref 78.0–100.0)
PLATELETS: 362 10*3/uL (ref 150–400)
Platelets: 97 10*3/uL — ABNORMAL LOW (ref 150–400)
RBC: 6.5 MIL/uL — AB (ref 3.87–5.11)
RBC: 3.92 MIL/uL (ref 3.87–5.11)
RDW: 14.6 % (ref 11.5–15.5)
RDW: 14.5 % (ref 11.5–15.5)
WBC: 4.3 10*3/uL (ref 4.0–10.5)
WBC: 4.1 10*3/uL (ref 4.0–10.5)

## 2014-02-22 LAB — FERRITIN: FERRITIN: 17 ng/mL (ref 10–291)

## 2014-02-22 LAB — FOLATE: Folate: 8.8 ng/mL

## 2014-02-22 LAB — IRON AND TIBC
Iron: 47 ug/dL (ref 42–145)
Saturation Ratios: 14 % — ABNORMAL LOW (ref 20–55)
TIBC: 343 ug/dL (ref 250–470)
UIBC: 296 ug/dL (ref 125–400)

## 2014-02-22 LAB — LIPASE, BLOOD: LIPASE: 35 U/L (ref 11–59)

## 2014-02-22 LAB — VITAMIN B12: VITAMIN B 12: 332 pg/mL (ref 211–911)

## 2014-02-22 MED ORDER — SODIUM CHLORIDE 0.9 % IV SOLN
INTRAVENOUS | Status: AC
  Administered 2014-02-22: 03:00:00 via INTRAVENOUS

## 2014-02-22 MED ORDER — DOXYCYCLINE HYCLATE 100 MG PO TABS
100.0000 mg | ORAL_TABLET | Freq: Two times a day (BID) | ORAL | Status: DC
  Administered 2014-02-22 – 2014-02-24 (×5): 100 mg via ORAL
  Filled 2014-02-22 (×6): qty 1

## 2014-02-22 MED ORDER — SODIUM CHLORIDE 0.9 % IV SOLN: INTRAVENOUS | Status: AC

## 2014-02-22 MED ORDER — MUPIROCIN 2 % EX OINT
TOPICAL_OINTMENT | Freq: Three times a day (TID) | CUTANEOUS | Status: DC
  Administered 2014-02-22 (×2): via TOPICAL
  Administered 2014-02-22: 1 via TOPICAL
  Filled 2014-02-22: qty 22

## 2014-02-22 MED ORDER — CETYLPYRIDINIUM CHLORIDE 0.05 % MT LIQD
7.0000 mL | Freq: Two times a day (BID) | OROMUCOSAL | Status: DC
  Administered 2014-02-23 – 2014-02-24 (×4): 7 mL via OROMUCOSAL

## 2014-02-22 MED ORDER — SODIUM CHLORIDE 0.9 % IV BOLUS (SEPSIS)
1000.0000 mL | Freq: Once | INTRAVENOUS | Status: AC
  Administered 2014-02-22: 1000 mL via INTRAVENOUS

## 2014-02-22 MED ORDER — POTASSIUM CHLORIDE 10 MEQ/100ML IV SOLN: 10.0000 meq | INTRAVENOUS | Status: DC

## 2014-02-22 MED ORDER — SODIUM CHLORIDE 0.9 % IV SOLN
8.0000 mg/h | INTRAVENOUS | Status: DC
  Administered 2014-02-22 (×2): 8 mg/h via INTRAVENOUS
  Filled 2014-02-22 (×6): qty 80

## 2014-02-22 MED ORDER — BENZALKONIUM CHLORIDE EX SOLN: 1.0000 "application " | Freq: Every day | CUTANEOUS | Status: DC

## 2014-02-22 MED ORDER — SULFAMETHOXAZOLE-TRIMETHOPRIM 800-160 MG PO TABS
2.0000 | ORAL_TABLET | Freq: Two times a day (BID) | ORAL | Status: DC
  Filled 2014-02-22 (×2): qty 2

## 2014-02-22 MED ORDER — PROMETHAZINE HCL 25 MG/ML IJ SOLN
12.5000 mg | Freq: Once | INTRAMUSCULAR | Status: AC
  Administered 2014-02-22: 12.5 mg via INTRAVENOUS
  Filled 2014-02-22: qty 1

## 2014-02-22 MED ORDER — PROMETHAZINE HCL 12.5 MG RE SUPP: 12.5000 mg | RECTAL | Status: DC | PRN

## 2014-02-22 MED ORDER — PANTOPRAZOLE SODIUM 40 MG IV SOLR
40.0000 mg | Freq: Once | INTRAVENOUS | Status: AC
  Administered 2014-02-22: 40 mg via INTRAVENOUS
  Filled 2014-02-22: qty 40

## 2014-02-22 MED ORDER — SODIUM CHLORIDE 0.9 % IJ SOLN
3.0000 mL | Freq: Two times a day (BID) | INTRAMUSCULAR | Status: DC
  Administered 2014-02-23: 3 mL via INTRAVENOUS

## 2014-02-22 MED ORDER — ONDANSETRON HCL 4 MG/2ML IJ SOLN: 4.0000 mg | Freq: Three times a day (TID) | INTRAMUSCULAR | Status: DC | PRN

## 2014-02-22 MED ORDER — TRAMADOL HCL 50 MG PO TABS
50.0000 mg | ORAL_TABLET | Freq: Two times a day (BID) | ORAL | Status: DC | PRN
  Administered 2014-02-22 – 2014-02-24 (×3): 50 mg via ORAL
  Filled 2014-02-22 (×4): qty 1

## 2014-02-22 MED ORDER — CHLORHEXIDINE GLUCONATE 0.12 % MT SOLN
15.0000 mL | Freq: Two times a day (BID) | OROMUCOSAL | Status: DC
  Administered 2014-02-22 – 2014-02-24 (×3): 15 mL via OROMUCOSAL
  Filled 2014-02-22 (×8): qty 15

## 2014-02-22 MED ORDER — PROMETHAZINE HCL 25 MG/ML IJ SOLN: 6.2500 mg | Freq: Four times a day (QID) | INTRAMUSCULAR | Status: DC | PRN

## 2014-02-22 MED ORDER — PANTOPRAZOLE SODIUM 40 MG IV SOLR: 40.0000 mg | Freq: Two times a day (BID) | INTRAVENOUS | Status: DC

## 2014-02-22 MED ORDER — PROMETHAZINE HCL 25 MG PO TABS
25.0000 mg | ORAL_TABLET | Freq: Four times a day (QID) | ORAL | Status: DC | PRN
  Administered 2014-02-22 (×2): 25 mg via ORAL
  Filled 2014-02-22 (×3): qty 1

## 2014-02-22 MED ORDER — AMITRIPTYLINE HCL 10 MG PO TABS
10.0000 mg | ORAL_TABLET | Freq: Every evening | ORAL | Status: DC | PRN
  Administered 2014-02-22: 10 mg via ORAL
  Filled 2014-02-22 (×3): qty 1

## 2014-02-22 MED ORDER — ACETAMINOPHEN 325 MG PO TABS
650.0000 mg | ORAL_TABLET | Freq: Four times a day (QID) | ORAL | Status: DC | PRN
  Administered 2014-02-22 – 2014-02-24 (×5): 650 mg via ORAL
  Filled 2014-02-22 (×5): qty 2

## 2014-02-22 NOTE — ED Notes (Signed)
[facility] called and made aware of need to transport patient to [place] at [facility]

## 2014-02-22 NOTE — Progress Notes (Signed)
New Admission Note  Arrival: via [facility] Mental Orientation: alert and oriented x4 Telemetry: n/a Assessment: See flow sheet. IV: left upper arm, saline locked Safety Measures: Side rails in place, fall risk assessment complete with patient verbalizing understanding of risks associated with falls. Pt verbalizes an understanding of how to use the call bell and to call for help before getting out of bed. Call bell within reach, bed in lowest position, non-skid socks applied. [address] [place] Orientation: Pt orientation to unit, room and routine. Information packet given to patient and safety video watched.  Admission armband ID verified with patient and in place.  Family: n/a  Orders have been reviewed and implemented. Will continue to monitor and assist as needed.  Velora Mediate, RN 02/22/2014 2:59 AM

## 2014-02-22 NOTE — Progress Notes (Signed)
**  Interval Note**  After attempting several times to get in touch with a GI, I was able to speak to [facility] Dr Olevia Perches, who said she was not on call for [facility] to contact their office.  Unfortunately, as I discussed with her, no one is answering the phones there.  She again, defers to [place].  Will continue to try and contact a GI doc.  Shareen Capwell M. Lajuana Ripple, DO PGY-1, [place]

## 2014-02-22 NOTE — ED Notes (Signed)
Patient states that she wants PIV to left upper arm removed due to discomfort when she moves Insertion site WNL  PIV site removed

## 2014-02-22 NOTE — ED Notes (Signed)
Report given to Thayer Headings, RN at [facility] All questions answered by this nurse

## 2014-02-22 NOTE — Progress Notes (Signed)
UR Completed.  [phone]  

## 2014-02-22 NOTE — H&P (Signed)
[facility] Admission History and Physical Service Pager: [phone]  Patient name: Dominique [name] Medical record number: [id number] Date of birth: 17-Sep-1975 Age: 39 y.o. Gender: female  Primary Care Provider: Phill Myron, MD Consultants: Gastroenterology Code Status:   Chief Complaint: Nausea and vomiting with dark emesis  Assessment and Plan: Dominique [name] is a 39 y.o. female presenting with acute onset nausea and vomiting with coffee-ground emesis and positive FOBT concern for GI bleed . 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  Concern for GI bleed - Pt. Here with multiple episodes of large volume vomitus of dark material without BRB throughout the day yesterday, recent NSAID use in the setting of known GERD and hx of gastritis and Schatzki ring dilation in the past (last 09/2010), but EGD and Colonoscopy by Dr. Cristina Gong (03/26/2012) without evidence of ring and gastritis evident. Dysphagia mostly to solids. Positive FOBT in the ED, and very mild drop in Hemoglobin 11.7 from 12.4. Persistently tachycardic in the ED. No sick contacts. Passing gas and having BM's.  - Admitted to telemetry under Dr. Andria Frames.  - Repeat AM CBC, PT / INR.  - Started on IV ppi - Repeat 1L bolus and then MIVF thereafter.  - Watch VS closely for hypotension and especially f/u improvement of tachycardia with repeat bolus and fluids.  - GI consult in the AM for EGD.  - Phenergan for nauea (pt. Has Zofran allergy) - AM CMET; Lipase given hx of pancreatitis per pt.  - KUB  - Watch electrolytes closely.   - Prior to d/c, will need some education around use of NSAIDs, soda intake  History of DVT - History of impaired venous return.  Slight trace swelling L > R to exam, however nontender to palpation. Tachycardic, but Well's score actually 0.  - SCD's for now given GIB.  - No O2 requirement at this time, but tachycardic most likely due to volume depletion. Will monitor closely.   Folliculitis - Pt. With well-healed / healing small infected follicles on bilateral breasts and left buttock. She was on Bactrim and finished this prior to being started on a course of Doxycycline. Afebrile here. WBC 9, though slight left shift most likely 2/2 vomiting.  - Continue doxycycline here once able to tolerate po.   Fibromyalgia - On Elavil as an outpatient.  - Continue home Elavil here.   FEN/GI: NPO for now; s/p 1L NS bolus; ordered 2nd liter followed by 12 hours MIVF Prophylaxis: SCD's given current GI bleed  Disposition: Pending GI consult, and patient improvement.  History of Present Illness: Dominique [name] is a 39 y.o. female presenting with 1-2 weeks of poor by mouth intake due to nausea and dysphagia primarily to solids, though patient has been able to take crackers per her report. Patient has recently been treated for folliculitis of the breast and left buttock with Bactrim as well as doxycycline. She says that this morning around 10 AM she began to experience large volume vomitus of dark colored foul-smelling material. She says that she has been having bowel movements regularly, though she can sometimes be irregular. She noticed blood on the paper towel when she wiped earlier today, that she does sometimes get this with her hemorrhoids. She has not noticed frankly dark stools or bloody stools recently.  She did not notice any bright red blood in her vomitus. She has very mild left upper quadrant abdominal pain, and reports a distant history of pancreatitis though her pain does not radiate to her back and is not severe in nature. She denies sick contacts, reports some chills, and subjective tactile fever. Says that she vomited  approximately 4 times with the last episode being around 7:30 PM yesterday. After this episode she felt very weak and needed to call EMS to bring her to the hospital. She does endorse recently taking Advil at night 4-600mg  to help with leg pain, and she has also taken some "Stand back powder" as well to help with the pain but she did not take any today. Review of her chart also shows recent rx for meloxicam. She drinks one 16-oz can of coke daily. The pain is related to her surgery in September of last year for varicose veins. She has never had any episodes of vomiting or GI bleeding similar to this. With prior episode of gastritis, she just had nausea. She denies alcohol or tobacco use. She endorses continued nausea.  In the ED, patient was found to be slightly tachycardic heart rate initially 134, though trending down to just over 100. Blood pressure was stable with systolics in the low 413K over 60s-70s. Lab work revealed positive FOBT. Hemoglobin 11.7 down from 12.2 previously. Platelets normal. White count normal. Coags within normal limits.   Review Of Systems: Per HPI with the following additions: None Otherwise 12 point review of systems was performed and was unremarkable.  Patient Active Problem List   Diagnosis Date Noted  . Upper GI bleed 02/22/2014  . Sore throat (viral) 02/13/2014  . Skin lesion of breast 01/13/2014  . Fibromyalgia 12/11/2013  . Hallucinations 12/11/2013  . Drainage from wound 10/14/2013  . Total body pain 07/18/2013  . Internal hemorrhoid, bleeding 03/27/2013  . Hand swelling 12/27/2012  . 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 Cystitis, chronic 10/15/2012  . Leg swelling 06/18/2012  . Gastritis 04/02/2012  . Obesity (BMI 30-39.9) 02/05/2012  . Varicose veins of lower extremities with other complications 44/01/270   Past Medical History: Past Medical History  Diagnosis Date  . Raynaud's disease   . DVT (deep venous thrombosis)   . Raynaud disease  12/21/2011  . Varicose veins   . GERD (gastroesophageal reflux disease)    Past Surgical History: Past Surgical History  Procedure Laterality Date  . Abdominal hysterectomy    . Cesarean section    . Appendectomy    . Hernia repair  2006  . Endovenous ablation saphenous vein w/ laser  02-01-2012    right greater saphenous vein and stab phlebectomy 10-20 incisions right leg  by Curt Jews, MD  . Endovenous ablation saphenous vein w/ laser Left 02-29-2012    left greater saphenous vein and stab phlebectomy left leg 10-20 incisions  by Curt Jews MD  . Esophagogastroduodenoscopy (egd) with propofol N/A 03/26/2012    Procedure: ESOPHAGOGASTRODUODENOSCOPY (EGD) WITH PROPOFOL;  Surgeon: Cleotis Nipper, MD;  Location: [facility] ENDOSCOPY;  Service: Endoscopy;  Laterality: N/A;  . Colonoscopy with propofol N/A 03/26/2012    Procedure: COLONOSCOPY WITH PROPOFOL;  Surgeon: Cleotis Nipper, MD;  Location: [facility] ENDOSCOPY;  Service: Endoscopy;  Laterality: N/A;  . Endovenous ablation saphenous vein w/ laser Left    Social History: History  Substance Use Topics  . Smoking status: Never Smoker   . Smokeless tobacco: Never Used  . Alcohol Use: No  Additional social history: None  Please also refer to relevant sections of EMR.  Family History: Family History  Problem Relation Age of Onset  . Diabetes Mother   . Heart disease Mother   . Hyperlipidemia Mother   . Hypertension Mother   . Other Mother     amputation, varicose veins  . Diabetes Father   . Hyperlipidemia Father   . Hypertension Father   . Peripheral vascular disease Father   . Other Father     varicose veins  . Deep vein thrombosis Brother   . Other Brother     varicose veins   Allergies and Medications: Allergies  Allergen Reactions  . Contrast Media [Iodinated Diagnostic Agents] Anaphylaxis and Swelling    Tongue swelling  . Latex Anaphylaxis  . Zofran [Ondansetron Hcl] Other (See Comments)    Hives, numbness of legs  with previous use  . Eggs Or Egg-Derived Products Nausea And Vomiting  . Lactose Intolerance (Gi) Diarrhea   No current facility-administered medications on file prior to encounter.   Current Outpatient Prescriptions on File Prior to Encounter  Medication Sig Dispense Refill  . amitriptyline (ELAVIL) 10 MG tablet Take 1 tablet (10 mg total) by mouth at bedtime. (Patient taking differently: Take 10 mg by mouth at bedtime as needed for sleep. ) 30 tablet 2  . doxycycline (VIBRA-TABS) 100 MG tablet Take 1 tablet (100 mg total) by mouth 2 (two) times daily. 20 tablet 0  . meloxicam (MOBIC) 15 MG tablet Take 1 tablet (15 mg total) by mouth daily. Take with food 30 tablet 2  . Multiple Vitamin (MULTIVITAMIN WITH MINERALS) TABS Take 1 tablet by mouth daily.    . mupirocin ointment (BACTROBAN) 2 % Apply to both nostrils TID for 1 month. 22 g 1  . omeprazole (PRILOSEC) 20 MG capsule TAKE ONE CAPSULE BY MOUTH ONCE DAILY 30 capsule 3  . promethazine (PHENERGAN) 25 MG tablet Take 1 tablet (25 mg total) by mouth every 6 (six) hours as needed for nausea. 30 tablet 1  . sulfamethoxazole-trimethoprim (BACTRIM DS,SEPTRA DS) 800-160 MG per tablet Take 2 tablets by mouth 2 (two) times daily. 40 tablet 0  . acetaminophen (TYLENOL ARTHRITIS PAIN) 650 MG CR tablet Take 1 tablet (650 mg total) by mouth 3 (three) times daily. Do not exceed 3000mg  (Patient not taking: Reported on 02/21/2014) 60 tablet 3  . Benzalkonium Chloride SOLN Use as body wash daily for 2 weeks. 1000 mL 0  . omeprazole (PRILOSEC) 20 MG capsule Take 2 capsules (40 mg total) by mouth daily. (Patient not taking: Reported on 02/21/2014) 30 capsule 3  . polyethylene glycol powder (GLYCOLAX/MIRALAX) powder Take 17 g by mouth 2 (two) times daily as needed. (Patient not taking: Reported on 12/12/2013) 3350 g 1  . valACYclovir (VALTREX) 1000 MG tablet Take 2 tablets (2,000 mg total) by mouth 2 (two) times daily. (Patient not taking: Reported on 12/12/2013) 4  tablet 0  . valACYclovir (VALTREX) 1000 MG tablet Take 0.5 tablets (500 mg total) by mouth 2 (two) times daily. (Patient not taking: Reported on 02/21/2014) 7 tablet 12    Objective: BP 113/72 mmHg  Pulse 98  Temp(Src) 97.9 F (36.6 C) (Oral)  Resp 18  Ht 4\' 7"  (1.397 m)  Wt 198 lb (89.812 kg)  BMI 46.02 kg/m2  SpO2 99% Exam: General: NAD, AAOx3, Comfortable HEENT: NCAT, PERRLA, Slightly Tacky mucus membranes, No LAD, No thyromegaly.  Cardiovascular: Tachycardic, Regular Rhythm, normal S1/S2, No MGR, 2+ distal pulses bilaterally Respiratory:  CTA Bilaterally, appropriate rate, unlabored.  Abdomen: S, Very Mild LUQ TTP, Otherwise +BS, No distension, No masses palpable. Obese Extremities: WWP, MAEW, Mild nonpitting lower extremity edema predominantly L>R Skin: Small healing / well healed areas of folliculitis over bilateral breasts 2-3 areas. Folliculitis over superior aspect of left buttock. No drainage, otherwise no rashes.  Neuro: AAOx3, Grossly intact.   Labs and Imaging: CBC BMET   Recent Labs Lab 02/21/14 2250  WBC 9.0  HGB 11.7*  HCT 36.4  PLT 440*    Recent Labs Lab 02/21/14 2250  NA 137  K 3.9  CL 105  CO2 21  BUN 17  CREATININE 0.80  GLUCOSE 123*  CALCIUM 8.7     FOBT - positive.   Aquilla Hacker, MD 02/22/2014, 2:42 AM PGY-1, [place] Intern pager: [phone], text pages welcome  I have seen and examined the patient with Dr Minda Ditto and agree with his assessment and plan with my additions in blue.  Hilton Sinclair, MD PGY-3, [place]

## 2014-02-22 NOTE — ED Notes (Signed)
Patient stated that EDP was supposed to order Phenergan--no new orders placed Will inform EDP

## 2014-02-22 NOTE — ED Notes (Signed)
Patient given Phenergan via right forearm PIV site placed by Dr. Sabra Heck

## 2014-02-22 NOTE — Progress Notes (Signed)
**Interval Note**  S: Patient reports that she continues to have intermittent nausea with no vomiting since admission.  She also endorses LE pain.  Asking for toradol.  I explained that she should avoid NSAIDs in setting of hematemesis.  Voiced good understanding.  Denies any other concerns at this time.   O: BP 100/63 mmHg  Pulse 106  Temp(Src) 99.4 F (37.4 C) (Oral)  Resp 15  Ht 4\' 8"  (1.422 m)  Wt 194 lb 7.1 oz (88.2 kg)  BMI 43.62 kg/m2  SpO2 97%  Gen: awake, alert, lying in bed, NAD ENT: EOMI, normal Cardio: regular rhythm but slightly tachy, no m/r/g GI: soft, obese, NT/ND, hypoactive BS Ext: WWP, no edema, no TTP to LE Neuro: speech normal, no focal deficits, follows commands  Results for orders placed or performed during the hospital encounter of 02/21/14 (from the past 24 hour(s))  POC occult blood, ED Provider will collect     Status: Abnormal   Collection Time: 02/21/14  9:41 PM  Result Value Ref Range   Fecal Occult Bld POSITIVE (A) NEGATIVE  CBC with Differential/Platelet     Status: Abnormal   Collection Time: 02/21/14 10:50 PM  Result Value Ref Range   WBC 9.0 4.0 - 10.5 K/uL   RBC 4.01 3.87 - 5.11 MIL/uL   Hemoglobin 11.7 (L) 12.0 - 15.0 g/dL   HCT 36.4 36.0 - 46.0 %   MCV 90.8 78.0 - 100.0 fL   MCH 29.2 26.0 - 34.0 pg   MCHC 32.1 30.0 - 36.0 g/dL   RDW 14.1 11.5 - 15.5 %   Platelets 440 (H) 150 - 400 K/uL   Neutrophils Relative % 94 (H) 43 - 77 %   Neutro Abs 8.4 (H) 1.7 - 7.7 K/uL   Lymphocytes Relative 4 (L) 12 - 46 %   Lymphs Abs 0.4 (L) 0.7 - 4.0 K/uL   Monocytes Relative 2 (L) 3 - 12 %   Monocytes Absolute 0.2 0.1 - 1.0 K/uL   Eosinophils Relative 0 0 - 5 %   Eosinophils Absolute 0.0 0.0 - 0.7 K/uL   Basophils Relative 0 0 - 1 %   Basophils Absolute 0.0 0.0 - 0.1 K/uL  Comprehensive metabolic panel     Status: Abnormal   Collection Time: 02/21/14 10:50 PM  Result Value Ref Range   Sodium 137 135 - 145 mmol/L   Potassium 3.9 3.5 - 5.1 mmol/L    Chloride 105 96 - 112 mmol/L   CO2 21 19 - 32 mmol/L   Glucose, Bld 123 (H) 70 - 99 mg/dL   BUN 17 6 - 23 mg/dL   Creatinine, Ser 0.80 0.50 - 1.10 mg/dL   Calcium 8.7 8.4 - 10.5 mg/dL   Total Protein 7.2 6.0 - 8.3 g/dL   Albumin 3.4 (L) 3.5 - 5.2 g/dL   AST 23 0 - 37 U/L   ALT 13 0 - 35 U/L   Alkaline Phosphatase 90 39 - 117 U/L   Total Bilirubin 0.3 0.3 - 1.2 mg/dL   GFR calc non Af Amer >90 >90 mL/min   GFR calc Af Amer >90 >90 mL/min   Anion gap 11 5 - 15  APTT     Status: None   Collection Time: 02/21/14 10:50 PM  Result Value Ref Range   aPTT 26 24 - 37 seconds  Protime-INR     Status: None   Collection Time: 02/21/14 10:50 PM  Result Value Ref Range   Prothrombin Time 14.4  11.6 - 15.2 seconds   INR 1.11 0.00 - 1.49  Reticulocytes     Status: None   Collection Time: 02/21/14 10:50 PM  Result Value Ref Range   Retic Ct Pct 1.2 0.4 - 3.1 %   RBC. 4.01 3.87 - 5.11 MIL/uL   Retic Count, Manual 48.1 19.0 - 186.0 K/uL  Comprehensive metabolic panel     Status: Abnormal   Collection Time: 02/22/14  6:15 AM  Result Value Ref Range   Sodium 138 135 - 145 mmol/L   Potassium 3.7 3.5 - 5.1 mmol/L   Chloride 110 96 - 112 mmol/L   CO2 20 19 - 32 mmol/L   Glucose, Bld 88 70 - 99 mg/dL   BUN 12 6 - 23 mg/dL   Creatinine, Ser 0.66 0.50 - 1.10 mg/dL   Calcium 7.9 (L) 8.4 - 10.5 mg/dL   Total Protein 5.4 (L) 6.0 - 8.3 g/dL   Albumin 2.5 (L) 3.5 - 5.2 g/dL   AST 16 0 - 37 U/L   ALT 10 0 - 35 U/L   Alkaline Phosphatase 71 39 - 117 U/L   Total Bilirubin 0.3 0.3 - 1.2 mg/dL   GFR calc non Af Amer >90 >90 mL/min   GFR calc Af Amer >90 >90 mL/min   Anion gap 8 5 - 15  CBC     Status: Abnormal   Collection Time: 02/22/14  6:15 AM  Result Value Ref Range   WBC 4.3 4.0 - 10.5 K/uL   RBC 6.50 (H) 3.87 - 5.11 MIL/uL   Hemoglobin 20.1 (H) 12.0 - 15.0 g/dL   HCT 57.4 (H) 36.0 - 46.0 %   MCV 88.3 78.0 - 100.0 fL   MCH 30.9 26.0 - 34.0 pg   MCHC 35.0 30.0 - 36.0 g/dL   RDW 14.6 11.5  - 15.5 %   Platelets 97 (L) 150 - 400 K/uL  Protime-INR     Status: None   Collection Time: 02/22/14  6:15 AM  Result Value Ref Range   Prothrombin Time 14.4 11.6 - 15.2 seconds   INR 1.10 0.00 - 1.49  Lipase, blood     Status: None   Collection Time: 02/22/14  6:15 AM  Result Value Ref Range   Lipase 35 11 - 59 U/L   Dg Abd 1 View  02/22/2014   CLINICAL DATA:  Emesis, persistent.  EXAM: ABDOMEN - 1 VIEW  COMPARISON:  09/30/2011  FINDINGS: The bowel gas pattern is normal. No evidence for organomegaly. Small left pelvic calcification likely represents a phlebolith. Visualized osseous structures have a normal appearance.  IMPRESSION: Nonobstructive bowel gas pattern.   Electronically Signed   By: Nolon Nations M.D.   On: 02/22/2014 08:17   A/P: Concern for GI bleed -have attempted to reach GI on call on BOTH [facility] number on Amion and [facility] number listed on amion.  NEITHER lines being answered after multiple attempts.  Have also contact operator for assistance with this.  Unfortunately, it looks like c/s to GI will have to wait until tomorrow.  Will continue to try and get in touch with someone -Promethazine switched to PO in setting of incompatibility with IV PPI.  Patient to let me know if she does not tolerate this.  Will give suppository instead. -CBC with very confusing results hgb 20.1?? Platelets 440 last evening and now 97?  Unsure of these results.  Lab to re-evaluate  Will repeat in am. -KUB with nonobstructive gas pattern.  Ashly M.  Lajuana Ripple, DO PGY-1, [place]

## 2014-02-22 NOTE — ED Notes (Signed)
Order for Phenergan placed Per Dr. Sabra Heck, patient to be transferred to [facility] due to patient is with [facility]

## 2014-02-23 ENCOUNTER — Inpatient Hospital Stay (HOSPITAL_COMMUNITY): Payer: No Typology Code available for payment source

## 2014-02-23 LAB — CBC
HEMATOCRIT: 32.8 % — AB (ref 36.0–46.0)
Hemoglobin: 10.5 g/dL — ABNORMAL LOW (ref 12.0–15.0)
MCH: 29 pg (ref 26.0–34.0)
MCHC: 32 g/dL (ref 30.0–36.0)
MCV: 90.6 fL (ref 78.0–100.0)
PLATELETS: 346 10*3/uL (ref 150–400)
RBC: 3.62 MIL/uL — AB (ref 3.87–5.11)
RDW: 14.5 % (ref 11.5–15.5)
WBC: 4.2 10*3/uL (ref 4.0–10.5)

## 2014-02-23 LAB — BASIC METABOLIC PANEL
ANION GAP: 5 (ref 5–15)
BUN: 8 mg/dL (ref 6–23)
CALCIUM: 8.2 mg/dL — AB (ref 8.4–10.5)
CHLORIDE: 110 mmol/L (ref 96–112)
CO2: 24 mmol/L (ref 19–32)
Creatinine, Ser: 0.6 mg/dL (ref 0.50–1.10)
GFR calc non Af Amer: >90 mL/min (ref >90)
GLUCOSE: 88 mg/dL (ref 70–99)
Potassium: 3.6 mmol/L (ref 3.5–5.1)
SODIUM: 139 mmol/L (ref 135–145)

## 2014-02-23 LAB — CLOSTRIDIUM DIFFICILE BY PCR: Toxigenic C. Difficile by PCR: NEGATIVE

## 2014-02-23 MED ORDER — MUPIROCIN 2 % EX OINT
TOPICAL_OINTMENT | Freq: Three times a day (TID) | CUTANEOUS | Status: DC
  Administered 2014-02-23 – 2014-02-24 (×5): via TOPICAL
  Filled 2014-02-23: qty 22

## 2014-02-23 MED ORDER — PANTOPRAZOLE SODIUM 40 MG PO TBEC
40.0000 mg | DELAYED_RELEASE_TABLET | Freq: Two times a day (BID) | ORAL | Status: DC
  Administered 2014-02-23 – 2014-02-24 (×4): 40 mg via ORAL
  Filled 2014-02-23 (×4): qty 1

## 2014-02-23 MED ORDER — PROMETHAZINE HCL 25 MG/ML IJ SOLN
6.2500 mg | Freq: Three times a day (TID) | INTRAMUSCULAR | Status: DC | PRN
  Administered 2014-02-24: 6.25 mg via INTRAVENOUS
  Filled 2014-02-23 (×2): qty 1

## 2014-02-23 NOTE — Progress Notes (Addendum)
Pt has BP of 89/53 manually. Asymptomatic. Without reports of dizziness or discomfort. Melacan MD of family medicine notified. Per MD, recheck BP in an hour and notify.  Update: 91/58 BP, asymptomatic. C. Melancon made aware. No new orders at this time.   Velora Mediate

## 2014-02-23 NOTE — Progress Notes (Signed)
Family Medicine Teaching Service Daily Progress Note Intern Pager: [phone]  Patient name: Dominique [name] Medical record number: [id number] Date of birth: July 10, 1975 Age: 39 y.o. Gender: female  Primary Care Provider: Phill Myron, MD Consultants: Gastroenterology Code Status: Full  Pt Overview and Major Events to Date:  2/14 - Admitted with acute upper GI Bleed. Stable.  2/15 - To get swallow study today.   Assessment and Plan: Dominique [name] is a 39 y.o. female presenting with acute onset nausea and vomiting with coffee-ground emesis and positive FOBT concern for GI bleed . PMH is significant for Schatzki rings status post dilation ( last in September 2012), gastritis, GERD, leg swelling secondary to decreased venous return status post varicose vein surgery, history of DVT, fibromyalgia, abscess of the breast, recurrent herpes status post treatment with Valtrex.  Concern for GI bleed - Pt. Here with multiple episodes of large volume vomitus of dark material without BRB throughout the day yesterday, recent NSAID use in the setting of known GERD and hx of gastritis and Schatzki ring dilation in the past (last 09/2010), but EGD and Colonoscopy by Dr. Cristina Gong (03/26/2012) without evidence of ring and gastritis evident. Dysphagia mostly to solids. Positive FOBT in the ED, and very mild drop in Hemoglobin 11.7 from 12.4. Persistently tachycardic in the ED, but now resolved. No sick contacts. Passing gas and having BM's.  - Admitted to telemetry. - Trending CBC's. Dropped from 11.5 to 10.5 today. Could be dilutional, but planning for EGD soon per GI.  - IV ppi >> Oral ppi 40mg  BID. Per GI recs.  - GI recs: Should go for pill Barium Swallow this am, with plan to EGD on 2/16.  - On MIVF - Diet advanced to Dysphagia 3.   - Soft BP's continue to monitor given slight drop in Hgb. Continue MIVF.  - Phenergan for nauea (pt. Has Zofran allergy) - Continue to trend BMETs - KUB unremarkable.   -  Prior to d/c, will need some education around use of NSAIDs, soda intake  History of DVT - History of impaired venous return. Slight trace swelling L > R to exam, however nontender to palpation. Tachycardic initially now resolved, but Well's score actually 0.  - SCD's for now given GIB.  - No O2 requirement at this time, Tachycardia resolved.  - Continue SCD's for ppx and monitor clinically.    Folliculitis - Pt. With well-healed / healing small infected follicles on bilateral breasts and left buttock. She was on Bactrim and finished this prior to being started on a course of Doxycycline. Afebrile here. WBC 9, though slight left shift most likely 2/2 vomiting.  - Continue doxycycline 100 BID >> stop date 1/24.   Fibromyalgia - On Elavil as an outpatient.  - Continue home Elavil here.   FEN/GI: NPO for now; s/p 1L NS bolus; ordered 2nd liter followed by 12 hours MIVF Prophylaxis: SCD's given current GI bleed  Disposition: Pending barium swallow and EGD.   Subjective:  No complaints. She says that she had an episode of feeling slightly weak overnight. She denies further melena, nausea, vomiting, or dark stools. She has been tolerating her diet well, she reports some difficulty with swallowing that continues.   Objective: Temp:  [98.3 F (36.8 C)-98.6 F (37 C)] 98.6 F (37 C) (02/15 0610) Pulse Rate:  [75-87] 75 (02/15 0610) Resp:  [16] 16 (02/15 0610) BP: (92-100)/(52-60) 92/56 mmHg (02/15 0610) SpO2:  [100 %] 100 % (02/15 0610) Weight:  [193 lb 2 oz (  87.6 kg)] 193 lb 2 oz (87.6 kg) (02/14 2126) Physical Exam: General: NAD, AAOx3 Cardiovascular: RRR, No MGR, Normal S1/S2, 2+ distal pulses Respiratory: CTA B, No wheezes, crackles, or rales, appropriate rate, unlabored.  Abdomen: S, NT, ND, +BS Extremities: WWP, 2+ Distal pulses, moving all extremities well.  Skin: Reexamined areas of folliculitis noted on admission. Appear to be healing well and without worsening erythema, or  drainage. Healing well. No other lesions or rashes.  Neuro: no focal deficits. AAOx3  Laboratory:  Recent Labs Lab 02/21/14 2250 02/22/14 0615 02/22/14 1710  WBC 9.0 4.3 4.1  HGB 11.7* 20.1* 11.5*  HCT 36.4 57.4* 36.1  PLT 440* 97* 362    Recent Labs Lab 02/21/14 2250 02/22/14 0615  NA 137 138  K 3.9 3.7  CL 105 110  CO2 21 20  BUN 17 12  CREATININE 0.80 0.66  CALCIUM 8.7 7.9*  PROT 7.2 5.4*  BILITOT 0.3 0.3  ALKPHOS 90 71  ALT 13 10  AST 23 16  GLUCOSE 123* 88   FOBT - positive.   Lipase - 35  Iron Panel  Iron - 47 UIBC - 296 TIBC - 343 Saturation - 14 Ferritin - 17 Folate - 8.8  B12 - 332   Imaging/Diagnostic Tests:  KUB 2/14 - Nonobstructive bowel gas pattern  Aquilla Hacker, MD 02/23/2014, 7:31 AM PGY-1, [place] Intern pager: [phone], text pages welcome

## 2014-02-23 NOTE — Progress Notes (Signed)
INITIAL NUTRITION ASSESSMENT  DOCUMENTATION CODES Per approved criteria  -Morbid Obesity   INTERVENTION: Provide nourishment snacks ([place] sandwich, sherbet). Ordered  Encouraged adequate PO intake.  NUTRITION DIAGNOSIS: Inadequate oral intake related to decreased appetite as evidenced by meal completion of 40%.   Goal: Pt to meet >/= 90% of their estimated nutrition needs   Monitor:  PO intake, weight trends, labs, I/O's  Reason for Assessment: MST  39 y.o. female  Admitting Dx: Upper GI bleed  ASSESSMENT: Pt presenting with acute onset nausea and vomiting with coffee-ground emesis and positive FOBT concern for GI bleed . PMH is significant for Schatzki rings status post dilation ( last in September 2012), gastritis, GERD, leg swelling secondary to decreased venous return status post varicose vein surgery, history of DVT, fibromyalgia, abscess of the breast, recurrent herpes.  Pt reports her appetite is just "ok" currently. She reports she ate some of her breakfast this AM, however it immediately gave her abdominal discomfort. Meal completion has been 40%. Pt reports she eats on average 1 large meal a day and a snack later in the day as it is most suitable with her work schedule. Pt reports her weight has been stable with usual body weight of 192-100 lbs. Pt was offered oral supplements as intake has been inadequate, however pt refused. Pt is agreeable to nourishment snacks. RD to order. Pt as encouraged to eat her food at meals.  Pt with no observed significant fat or muscle mass loss.  Labs and medications reviewed.  Height: Ht Readings from Last 1 Encounters:  02/22/14 4\' 8"  (1.422 m)    Weight: Wt Readings from Last 1 Encounters:  02/22/14 193 lb 2 oz (87.6 kg)    Ideal Body Weight: 93 lbs  % Ideal Body Weight: 208%  Wt Readings from Last 10 Encounters:  02/22/14 193 lb 2 oz (87.6 kg)  02/13/14 200 lb 14.4 oz (91.128 kg)  01/13/14 199 lb 14.4 oz (90.674 kg)   12/12/13 200 lb (90.719 kg)  12/11/13 198 lb (89.812 kg)  10/29/13 198 lb (89.812 kg)  10/21/13 196 lb 12.8 oz (89.268 kg)  10/14/13 198 lb 4.8 oz (89.948 kg)  10/09/13 198 lb (89.812 kg)  10/02/13 196 lb (88.905 kg)    Usual Body Weight: 193 lbs  % Usual Body Weight: 100%  BMI:  Body mass index is 43.32 kg/(m^2). Morbid obesity  Estimated Nutritional Needs: Kcal: 1850-2100 Protein: 90-105 grams Fluid: 1.85- 2.1 L/day  Skin: +1 generalized edema  Diet Order: DIET DYS 3  EDUCATION NEEDS: -No education needs identified at this time  No intake or output data in the 24 hours ending 02/23/14 0957  Last BM: 2/14  Labs:   Recent Labs Lab 02/21/14 2250 02/22/14 0615 02/23/14 0647  NA 137 138 139  K 3.9 3.7 3.6  CL 105 110 110  CO2 21 20 24   BUN 17 12 8   CREATININE 0.80 0.66 0.60  CALCIUM 8.7 7.9* 8.2*  GLUCOSE 123* 88 88    CBG (last 3)  No results for input(s): GLUCAP in the last 72 hours.  Scheduled Meds: . antiseptic oral rinse  7 mL Mouth Rinse q12n4p  . chlorhexidine  15 mL Mouth Rinse BID  . doxycycline  100 mg Oral BID  . pantoprazole  40 mg Oral BID AC  . sodium chloride  3 mL Intravenous Q12H    Continuous Infusions:   Past Medical History  Diagnosis Date  . Raynaud's disease   . DVT (deep  venous thrombosis)   . Raynaud disease 12/21/2011  . Varicose veins   . GERD (gastroesophageal reflux disease)     Past Surgical History  Procedure Laterality Date  . Abdominal hysterectomy    . Cesarean section    . Appendectomy    . Hernia repair  2006  . Endovenous ablation saphenous vein w/ laser  02-01-2012    right greater saphenous vein and stab phlebectomy 10-20 incisions right leg  by Curt Jews, MD  . Endovenous ablation saphenous vein w/ laser Left 02-29-2012    left greater saphenous vein and stab phlebectomy left leg 10-20 incisions  by Curt Jews MD  . Esophagogastroduodenoscopy (egd) with propofol N/A 03/26/2012    Procedure:  ESOPHAGOGASTRODUODENOSCOPY (EGD) WITH PROPOFOL;  Surgeon: Cleotis Nipper, MD;  Location: [facility];  Service: Endoscopy;  Laterality: N/A;  . Colonoscopy with propofol N/A 03/26/2012    Procedure: COLONOSCOPY WITH PROPOFOL;  Surgeon: Cleotis Nipper, MD;  Location: [facility];  Service: Endoscopy;  Laterality: N/A;  . Endovenous ablation saphenous vein w/ laser Left     Kallie Locks, MS, RD, LDN Pager # [phone] After hours/ weekend pager # [phone]

## 2014-02-23 NOTE — Consult Note (Signed)
Referring Provider: Dr. Madison Hickman (family practice teaching service) Primary Care Physician:  Phill Myron, MD Primary Gastroenterologist:  Dr. Ronald Lobo  Reason for Consultation:  Coffee-ground emesis, nausea and vomiting, dysphagia  HPI: Dominique [name] is a 39 y.o. female known to me from the outpatient setting. 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  The patient has a prior history of a Savary dilatation of the esophagus in September 8242, which was complicated by prolonged throat pain on swallowing, as though she had had a cricopharyngeal abrasion, but which brought about substantial improvement in dysphagia symptoms. More recently, endoscopic evaluation in March 2014 for heme positive stool showed antral gastritis consistent with NSAID gastropathy.  With that background, the patient gives a several month history of nausea which culminated in protracted vomiting on Saturday, September 13. Some of this emesis was dark in color but there was no visible blood. She felt like she was on the verge of passing out while she was taking a bath. She was brought by ambulance to the emergency room where she was Hemoccult positive and tachycardic but blood pressure was about baseline at 113/72. Hemoglobin had dropped slightly from 12.2 approximately 2 months earlier to 11.7 at the time of presentation in the emergency room. At that time, there was also a slight rise in her BUN to 17, as compared to 9 when checked 2 months earlier. Since coming into the hospital, there has not been any further vomiting, and in fact, she now feels hungry and would like to eat solid food.  Although the patient indicates that she takes omeprazole on a daily basis, she has also had increased use of nonsteroidal anti-inflammatory drugs recently.  She denies anorexia or weight loss. She has not had melenic stools. She did  recently see a small amount of red blood after a bowel movement, but she has a history of hemorrhoidal bleeding.  She did have an episode of prolonged food impaction about a month ago, with fairly frequent episodes of brief "causing" of food in the distal esophagus since that. The patient is known to have a Schatzki's ring based on prior endoscopic evaluation.  Past Medical History  Diagnosis Date  . Raynaud's disease   . DVT (deep venous thrombosis)   . Raynaud disease 12/21/2011  . Varicose veins   . GERD (gastroesophageal reflux disease)     Past Surgical History  Procedure Laterality Date  . Abdominal hysterectomy    . Cesarean section    . Appendectomy    . Hernia repair  2006  . Endovenous ablation saphenous vein w/ laser  02-01-2012    right greater saphenous vein and stab phlebectomy 10-20 incisions right leg  by Curt Jews, MD  . Endovenous ablation saphenous vein w/ laser Left 02-29-2012    left greater saphenous vein and stab phlebectomy left leg 10-20 incisions  by Curt Jews MD  . Esophagogastroduodenoscopy (egd) with propofol N/A 03/26/2012    Procedure: ESOPHAGOGASTRODUODENOSCOPY (EGD) WITH PROPOFOL;  Surgeon: Cleotis Nipper, MD;  Location: [facility];  Service: Endoscopy;  Laterality: N/A;  . Colonoscopy with propofol N/A 03/26/2012    Procedure: COLONOSCOPY WITH PROPOFOL;  Surgeon: Cleotis Nipper, MD;  Location: [facility];  Service: Endoscopy;  Laterality: N/A;  . Endovenous ablation saphenous vein w/ laser Left     Prior to Admission medications   Medication Sig Start Date End Date Taking? Authorizing  Provider  acetaminophen (TYLENOL) 325 MG tablet Take 650 mg by mouth every 6 (six) hours as needed for moderate pain.   Yes Historical Provider, MD  amitriptyline (ELAVIL) 10 MG tablet Take 1 tablet (10 mg total) by mouth at bedtime. Patient taking differently: Take 10 mg by mouth at bedtime as needed for sleep.  02/03/14  Yes Olam Idler, MD  doxycycline  (VIBRA-TABS) 100 MG tablet Take 1 tablet (100 mg total) by mouth 2 (two) times daily. 02/21/14  Yes Renee A Kuneff, DO  hyoscyamine (LEVSIN SL) 0.125 MG SL tablet Take 0.125 mg by mouth every 6 (six) hours as needed for cramping.  02/19/14  Yes Historical Provider, MD  meloxicam (MOBIC) 15 MG tablet Take 1 tablet (15 mg total) by mouth daily. Take with food 02/13/14  Yes Renee A Kuneff, DO  Multiple Vitamin (MULTIVITAMIN WITH MINERALS) TABS Take 1 tablet by mouth daily.   Yes Historical Provider, MD  mupirocin ointment (BACTROBAN) 2 % Apply to both nostrils TID for 1 month. 02/07/14  Yes Harden Mo, MD  omeprazole (PRILOSEC) 20 MG capsule TAKE ONE CAPSULE BY MOUTH ONCE DAILY 02/19/14  Yes Olam Idler, MD  promethazine (PHENERGAN) 25 MG tablet Take 1 tablet (25 mg total) by mouth every 6 (six) hours as needed for nausea. 02/03/14  Yes Olam Idler, MD  sulfamethoxazole-trimethoprim (BACTRIM DS,SEPTRA DS) 800-160 MG per tablet Take 2 tablets by mouth 2 (two) times daily. 02/07/14  Yes Harden Mo, MD  acetaminophen (TYLENOL ARTHRITIS PAIN) 650 MG CR tablet Take 1 tablet (650 mg total) by mouth 3 (three) times daily. Do not exceed 3000mg  Patient not taking: Reported on 02/21/2014 03/27/13   Kandis Nab, MD  Benzalkonium Chloride SOLN Use as body wash daily for 2 weeks. 02/07/14   Harden Mo, MD  omeprazole (PRILOSEC) 20 MG capsule Take 2 capsules (40 mg total) by mouth daily. Patient not taking: Reported on 02/21/2014 10/29/13   Olam Idler, MD  polyethylene glycol powder (GLYCOLAX/MIRALAX) powder Take 17 g by mouth 2 (two) times daily as needed. Patient not taking: Reported on 12/12/2013 03/27/13   Kandis Nab, MD  valACYclovir (VALTREX) 1000 MG tablet Take 2 tablets (2,000 mg total) by mouth 2 (two) times daily. Patient not taking: Reported on 12/12/2013 05/07/13   Coral Spikes, DO  valACYclovir (VALTREX) 1000 MG tablet Take 0.5 tablets (500 mg total) by mouth 2 (two) times  daily. Patient not taking: Reported on 02/21/2014 02/07/14   Harden Mo, MD    Current Facility-Administered Medications  Medication Dose Route Frequency Provider Last Rate Last Dose  . acetaminophen (TYLENOL) tablet 650 mg  650 mg Oral Q6H PRN Hilton Sinclair, MD   650 mg at 02/22/14 2209  . amitriptyline (ELAVIL) tablet 10 mg  10 mg Oral QHS PRN Hilton Sinclair, MD   10 mg at 02/22/14 2317  . antiseptic oral rinse (CPC / CETYLPYRIDINIUM CHLORIDE 0.05%) solution 7 mL  7 mL Mouth Rinse q12n4p Zigmund Gottron, MD   7 mL at 02/22/14 1330  . chlorhexidine (PERIDEX) 0.12 % solution 15 mL  15 mL Mouth Rinse BID Zigmund Gottron, MD   15 mL at 02/22/14 1013  . doxycycline (VIBRA-TABS) tablet 100 mg  100 mg Oral BID Hilton Sinclair, MD   100 mg at 02/22/14 2159  . mupirocin ointment (BACTROBAN) 2 %   Topical TID Hilton Sinclair, MD   1 application at 63/89/37  2158  . pantoprazole (PROTONIX) 80 mg in sodium chloride 0.9 % 250 mL (0.32 mg/mL) infusion  8 mg/hr Intravenous Continuous Hilton Sinclair, MD 25 mL/hr at 02/22/14 1641 8 mg/hr at 02/22/14 1641  . [START ON 02/25/2014] pantoprazole (PROTONIX) injection 40 mg  40 mg Intravenous Q12H Hilton Sinclair, MD      . promethazine (PHENERGAN) tablet 25 mg  25 mg Oral Q6H PRN Janora Norlander, DO   25 mg at 02/22/14 2321  . sodium chloride 0.9 % injection 3 mL  3 mL Intravenous Q12H Hilton Sinclair, MD   3 mL at 02/22/14 1010  . traMADol (ULTRAM) tablet 50 mg  50 mg Oral Q12H PRN Janora Norlander, DO   50 mg at 02/22/14 1850    Allergies as of 02/21/2014 - Review Complete 02/21/2014  Allergen Reaction Noted  . Contrast media [iodinated diagnostic agents] Anaphylaxis and Swelling 09/30/2011  . Latex Anaphylaxis 02/03/2011  . Zofran [ondansetron hcl] Other (See Comments) 12/13/2011  . Eggs or egg-derived products Nausea And Vomiting 12/12/2011  . Lactose intolerance (gi) Diarrhea 12/12/2011    Family  History  Problem Relation Age of Onset  . Diabetes Mother   . Heart disease Mother   . Hyperlipidemia Mother   . Hypertension Mother   . Other Mother     amputation, varicose veins  . Diabetes Father   . Hyperlipidemia Father   . Hypertension Father   . Peripheral vascular disease Father   . Other Father     varicose veins  . Deep vein thrombosis Brother   . Other Brother     varicose veins    History   Social History  . Marital Status: Divorced    Spouse Name: N/A  . Number of Children: N/A  . Years of Education: N/A   Occupational History  . Not on file.   Social History Main Topics  . Smoking status: Never Smoker   . Smokeless tobacco: Never Used  . Alcohol Use: No  . Drug Use: No     Comment: former narcotic presription abuse  . Sexual Activity: No   Other Topics Concern  . Not on file   Social History Narrative    Review of Systems:   Physical Exam: Vital signs in last 24 hours: Temp:  [98.3 F (36.8 C)-99.4 F (37.4 C)] 98.3 F (36.8 C) (02/14 2126) Pulse Rate:  [80-112] 80 (02/14 2126) Resp:  [15-16] 16 (02/14 2126) BP: (96-100)/(52-63) 96/52 mmHg (02/14 2126) SpO2:  [97 %-100 %] 100 % (02/14 2126) Weight:  [87.6 kg (193 lb 2 oz)-88.2 kg (194 lb 7.1 oz)] 87.6 kg (193 lb 2 oz) (02/14 2126) Last BM Date: 02/22/14 General:   Alert,  Well-developed, significantly overweight, pleasant and cooperative in NAD lying in bed Head:  Normocephalic and atraumatic. Eyes:  Sclera clear, no icterus.    Mouth:   No ulcerations or lesions.  Oropharynx pink & moist. Lungs:  Clear throughout to auscultation.   No wheezes, crackles, or rhonchi. No evident respiratory distress. Heart:   Regular rate and rhythm; no murmurs, clicks, rubs,  or gallops. Abdomen:  Soft, nontender, nontympanitic, and nondistended. No masses, hepatosplenomegaly or ventral hernias noted. No guarding, or rebound.   Rectal:   Hemoccult positive in the emergency room, not repeated   Msk:    Symmetrical without gross deformities. Pulses:  unable to palpate radial pulses Extremities:   Without clubbing, cyanosis, or edema.. The patient has had recent lower extremity  edema but it has gone down since coming into the hospital.  Neurologic:  Alert and coherent;  grossly normal neurologically. Skin:  Intact without significant lesions or rashes. Psych:   Alert and cooperative. Normal mood and affect.  Intake/Output from previous day:   Intake/Output this shift:    Lab Results:  Recent Labs  02/21/14 2250 02/22/14 0615 02/22/14 1710  WBC 9.0 4.3 4.1  HGB 11.7* 20.1* 11.5*  HCT 36.4 57.4* 36.1  PLT 440* 97* 362   BMET  Recent Labs  02/21/14 2250 02/22/14 0615  NA 137 138  K 3.9 3.7  CL 105 110  CO2 21 20  GLUCOSE 123* 88  BUN 17 12  CREATININE 0.80 0.66  CALCIUM 8.7 7.9*   LFT  Recent Labs  02/22/14 0615  PROT 5.4*  ALBUMIN 2.5*  AST 16  ALT 10  ALKPHOS 71  BILITOT 0.3   PT/INR  Recent Labs  02/21/14 2250 02/22/14 0615  LABPROT 14.4 14.4  INR 1.11 1.10    Studies/Results: Dg Abd 1 View  02/22/2014   CLINICAL DATA:  Emesis, persistent.  EXAM: ABDOMEN - 1 VIEW  COMPARISON:  09/30/2011  FINDINGS: The bowel gas pattern is normal. No evidence for organomegaly. Small left pelvic calcification likely represents a phlebolith. Visualized osseous structures have a normal appearance.  IMPRESSION: Nonobstructive bowel gas pattern.   Electronically Signed   By: Nolon Nations M.D.   On: 02/22/2014 08:17    Impression: 1. History of dark or coffee ground emesis without current evidence of bleeding 2. History of exposure to nonsteroidal anti-inflammatory drugs in increased amounts recently, but also on omeprazole 3. Protracted recent vomiting, now resolved for over 24 hours, currently asking for solid food 4. Mild drop in hemoglobin and mild rise in BUN, suggestive but not compelling for a recent mild upper GI bleed 5. Dysphagia, recurrent, with recent  food impaction, status post esophageal dilatation over 3 years ago (prolonged throat pain following dilatation)  Plan: 1. No need for urgent or emergent endoscopic evaluation 2. Will transition to oral pantoprazole and allow a soft diet 3. Barium swallow with tablet later today, to define point of obstruction 4. Anticipate probable upper endoscopy with through-the-scope balloon dilatation of the esophagus, probably on Tuesday, for relief of dysphagia symptoms and evaluation of her recent nausea and vomiting in the setting of NSAID exposure. In view of her high BMI, we will probably try to arrange for her endoscopy to be done under propofol sedation.  We appreciate the opportunity to see this patient in consultation with you.   LOS: 1 day   Zakiyyah Savannah V  02/23/2014, 1:59 AM

## 2014-02-24 LAB — CBC WITH DIFFERENTIAL/PLATELET
BASOS PCT: 0 % (ref 0–1)
Basophils Absolute: 0 10*3/uL (ref 0.0–0.1)
Eosinophils Absolute: 0.3 10*3/uL (ref 0.0–0.7)
Eosinophils Relative: 6 % — ABNORMAL HIGH (ref 0–5)
HCT: 33.8 % — ABNORMAL LOW (ref 36.0–46.0)
HEMOGLOBIN: 11 g/dL — AB (ref 12.0–15.0)
LYMPHS PCT: 43 % (ref 12–46)
Lymphs Abs: 2.3 10*3/uL (ref 0.7–4.0)
MCH: 29.8 pg (ref 26.0–34.0)
MCHC: 32.5 g/dL (ref 30.0–36.0)
MCV: 91.6 fL (ref 78.0–100.0)
Monocytes Absolute: 0.6 10*3/uL (ref 0.1–1.0)
Monocytes Relative: 12 % (ref 3–12)
NEUTROS PCT: 39 % — AB (ref 43–77)
Neutro Abs: 2.1 10*3/uL (ref 1.7–7.7)
PLATELETS: 363 10*3/uL (ref 150–400)
RBC: 3.69 MIL/uL — AB (ref 3.87–5.11)
RDW: 14.5 % (ref 11.5–15.5)
WBC: 5.3 10*3/uL (ref 4.0–10.5)

## 2014-02-24 LAB — TROPONIN I: Troponin I: <0.03 ng/mL (ref <0.031)

## 2014-02-24 LAB — BASIC METABOLIC PANEL
Anion gap: 5 (ref 5–15)
BUN: 9 mg/dL (ref 6–23)
CALCIUM: 8.1 mg/dL — AB (ref 8.4–10.5)
CO2: 25 mmol/L (ref 19–32)
Chloride: 107 mmol/L (ref 96–112)
Creatinine, Ser: 0.58 mg/dL (ref 0.50–1.10)
GFR calc Af Amer: >90 mL/min (ref >90)
GFR calc non Af Amer: >90 mL/min (ref >90)
GLUCOSE: 90 mg/dL (ref 70–99)
Potassium: 3.4 mmol/L — ABNORMAL LOW (ref 3.5–5.1)
Sodium: 137 mmol/L (ref 135–145)

## 2014-02-24 MED ORDER — DOXYCYCLINE HYCLATE 100 MG PO TABS: 100.0000 mg | ORAL_TABLET | Freq: Two times a day (BID) | ORAL | Status: AC

## 2014-02-24 MED ORDER — SODIUM CHLORIDE 0.9 % IV BOLUS (SEPSIS)
1000.0000 mL | Freq: Once | INTRAVENOUS | Status: AC
  Administered 2014-02-24: 1000 mL via INTRAVENOUS

## 2014-02-24 MED ORDER — OMEPRAZOLE 40 MG PO CPDR: 40.0000 mg | DELAYED_RELEASE_CAPSULE | Freq: Two times a day (BID) | ORAL | Status: DC

## 2014-02-24 NOTE — Progress Notes (Signed)
Patient Discharge: Disposition: Patient discharged home.  Education: Educated on medications, prescriptions, follow-up appointments, and discharge instructions. IV: Discontinued IV before discharge. Telemetry: Discontinued before discharge, CCMD notified. Transportation: Patient transported in w/c with the staff accompanying. Belongings: Patient took all her belongings with her.

## 2014-02-24 NOTE — Telephone Encounter (Signed)
Patient is currently admitted to hospital.

## 2014-02-24 NOTE — Discharge Summary (Signed)
[facility] Discharge Summary  Patient name: Dominique [name] Medical record number: [id number] Date of birth: [birth year] Age: 39 y.o. 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   Disposition: Home  Discharge Condition: Stable  Discharge Exam:  General: NAD, AAOx3 Cardiovascular: RRR, No MGR, Normal S1/S2, 2+ distal pulses Respiratory: CTA B, No wheezes, crackles, or rales, appropriate rate, unlabored.  Abdomen: S, NT, ND, +BS Extremities: WWP, 2+ Distal pulses, moving all extremities well.  Skin: Reexamined areas of folliculitis noted on admission. Appear to be healing well and without worsening erythema, or drainage. Healing well. No other lesions or rashes.  Neuro: no focal deficits. AAOx3  Brief Hospital Course:  Dominique [name] is a 39 y.o. female presenting with acute onset nausea and vomiting with coffee-ground emesis and positive FOBT concern for GI bleed . PMH is significant for Schatzki rings status post dilation ( last in September 2012), gastritis, GERD, leg swelling secondary to decreased venous return status post varicose vein surgery, history of DVT, fibromyalgia, abscess of the breast, recurrent herpes status post treatment with Valtrex.  Concern for GI Bleed-  Pt. Admitted for concern of GIB with hx of reported NSAID use including ibuprofen, powders, and Mobic as well as given FOBT + and history of large volume dark  vomitus. She did not have any further vomiting here. Her hemoglobin remained stable throughout admission 12 > 13 > 11 > 10.5 > 11. She Was initially somewhat tachycardic, however this shortly resolved with IVF. She was started on PPI by IV and given phenergan for nausea. She also had a KUB that was unremarkable. She did give a history of some dysphagia over the past two weeks with the sensation of "catching" primarily in the center of her chest. This was felt relevant given her history of Schatzki rings. GI was consulted, and fortunately Dr. Cristina Gong who had previously done upper endoscopic procedure on her was able to see her in the hospital. 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 After this procedure, it was felt that she would need EGD, however given that she had remained so stable without further GI bleeding, and after discussion with Dr. Oletta Lamas of the gastroenterology team, it was felt that she could follow up closely with Dr. Cristina Gong as an outpatient for further workup and EGD. She was found to be stable and safe for discharge with close outpatient follow up.   Hypotension -  Pt. With low baseline blood pressures upon chart review mostly in the low 841'Y systolic and 60'Y - 30'Z diastolic. However, she did have some episodes of low blood pressures to 80's / upper 40's during this admission. She remained asymptomatic and did not become tachycardic. She was given fluids which slightly improved her blood pressure to 90's / 50's. This appears to be her baseline, and especially given that she is asymptomatic and not tachycardic would seem more so to be her baseline. She did however have an episode of diaphoresis and nuasea, for which EKG and troponins  were ordered. She has very few cardiac risk factors, and the EKG was in keeping with this - normal sinus rhythm without acute changes. Troponins were negative. She was found to be safe  and stable for discharge.   Folliculitis -  Pt. Has a history of folliculitis on bilateral breasts and left upper buttock. She had been undergoing treatment with Bactrim that was transitioned to Doxycycline whenever she was admitted. The areas were healing well, without frank erythema, tenderness, and no drainage. No fluctuance to palpation. Her doxycycline was continued during this admission and will be continued until 03/01/2014. She agrees that it is much better.   Other chronic medical problems managed during this admission:  History of DVT - Noted on patient's problem list. Well's score 0 on admission. She was placed on SCD's due to the workup for GI bleeding. No calf tenderness, and resolution of tachycardia with IV fluids.  Fibromyalgia - Pt. With a history of fibromyalgia treated with Elavil. She has been managed by Dr. Berkley Harvey for this, and her symptoms are under control at this time.    Issues for Follow Up:  1. Follow Up GI workup and EGD  2. Follow up CBC to insure resolution of anemia 3. Follow up Folliculitis finalized healing.  4. Follow up Blood pressures.  Significant Procedures: DG Esophagram   Significant Labs and Imaging:   Recent Labs Lab 02/22/14 1710 02/23/14 0647 02/24/14 0856  WBC 4.1 4.2 5.3  HGB 11.5* 10.5* 11.0*  HCT 36.1 32.8* 33.8*  PLT 362 346 363    Recent Labs Lab 02/21/14 2250 02/22/14 0615 02/23/14 0647 02/24/14 0856  NA 137 138 139 137  K 3.9 3.7 3.6 3.4*  CL 105 110 110 107  CO2 21 20 24 25   GLUCOSE 123* 88 88 90  BUN 17 12 8 9   CREATININE 0.80 0.66 0.60 0.58  CALCIUM 8.7 7.9* 8.2* 8.1*  ALKPHOS 90 71  --   --   AST 23 16  --   --   ALT 13 10  --   --   ALBUMIN 3.4* 2.5*  --   --    Lipase - 35  Iron Panel  Iron - 47 UIBC - 296 TIBC - 343 Saturation - 14 Ferritin - 17 Folate - 8.8  B12 - 332   KUB 2/14 - Nonobstructive bowel gas pattern  DG Esophagram 2/15 -   IMPRESSION: 1. Short segment benign-appearing Mild  narrowing of the distal esophagus at the EG junction which did not allow passage of the 13 mm barium tablet. 2. Gastroesophageal reflux was elicited with use of the water siphon maneuver. No evidence of esophagitis. Normal primary esophageal peristalsis.  Results/Tests Pending at Time of Discharge: None  Discharge Medications:    Medication List    STOP taking these medications        meloxicam 15 MG tablet  Commonly known as:  MOBIC     sulfamethoxazole-trimethoprim 800-160 MG per tablet  Commonly known as:  BACTRIM DS,SEPTRA DS      TAKE these medications        acetaminophen 325 MG tablet  Commonly known as:  TYLENOL  Take 650 mg by mouth every 6 (six) hours as needed for moderate pain.     acetaminophen 650 MG CR tablet  Commonly known as:  TYLENOL ARTHRITIS PAIN  Take 1 tablet (650 mg total) by mouth 3 (three) times daily. Do not exceed 3000mg      amitriptyline 10 MG tablet  Commonly known as:  ELAVIL  Take 1 tablet (10 mg total) by mouth at bedtime.     Benzalkonium Chloride Soln  Use as body wash daily for 2 weeks.     doxycycline 100 MG tablet  Commonly known as:  VIBRA-TABS  Take 1 tablet (100 mg total) by mouth 2 (two) times daily.     hyoscyamine 0.125 MG SL tablet  Commonly known as:  LEVSIN SL  Take 0.125 mg by mouth every 6 (six) hours as needed for cramping.     multivitamin with minerals Tabs tablet  Take 1 tablet by mouth daily.     mupirocin ointment 2 %  Commonly known as:  BACTROBAN  Apply to both nostrils TID for 1 month.     omeprazole 40 MG capsule  Commonly known as:  PRILOSEC  Take 1 capsule (40 mg total) by mouth 2 (two) times daily before a meal.     polyethylene glycol powder powder  Commonly known as:  GLYCOLAX/MIRALAX  Take 17 g by mouth 2 (two) times daily as needed.     promethazine 25 MG tablet  Commonly known as:  PHENERGAN  Take 1 tablet (25 mg total) by mouth every 6 (six) hours as needed for nausea.      valACYclovir 1000 MG tablet  Commonly known as:  VALTREX  Take 2 tablets (2,000 mg total) by mouth 2 (two) times daily.     valACYclovir 1000 MG tablet  Commonly known as:  VALTREX  Take 0.5 tablets (500 mg total) by mouth 2 (two) times daily.        Discharge Instructions: Please refer to Patient Instructions section of EMR for full details.  Patient was counseled important signs and symptoms that should prompt return to medical care, changes in medications, dietary instructions, activity restrictions, and follow up appointments.   Follow-Up Appointments: Follow-up Information    Follow up with Phill Myron, MD. Go on 03/09/2014.   Specialty:  Family Medicine   Why:  Hospital F/U - 1:30 pm.    Contact information:   [place] [address] [phone]       Follow up with Cleotis Nipper, MD. Go on 02/25/2014.   Specialty:  Gastroenterology   Why:  Dysphagia follow up- 3:30 pm.    Contact information:   [address], [place] [address] [phone]       Aquilla Hacker, MD 02/24/2014, 12:22 PM PGY-1, [place]

## 2014-02-24 NOTE — Progress Notes (Signed)
Family Medicine Teaching Service Daily Progress Note Intern Pager: [phone]  Patient name: Dominique [name] Medical record number: [id number] Date of birth: October 28, 1975 Age: 39 y.o. Gender: female  Primary Care Provider: Phill Myron, MD Consultants: Gastroenterology Code Status: Full  Pt Overview and Major Events to Date:  2/14 - Admitted with acute upper GI Bleed. Stable.  2/15 - To get swallow study today.  2/16 - EGD today. F/U GI recs.   Assessment and Plan: Dominique [name] is a 39 y.o. female presenting with acute onset nausea and vomiting with coffee-ground emesis and positive FOBT concern for GI bleed . PMH is significant for Schatzki rings status post dilation ( last in September 2012), gastritis, GERD, leg swelling secondary to decreased venous return status post varicose vein surgery, history of DVT, fibromyalgia, abscess of the breast, recurrent herpes status post treatment with Valtrex.  Concern for GI bleed - Pt. Here with multiple episodes of large volume vomitus of dark material without BRB throughout the day yesterday, recent NSAID use in the setting of known GERD and hx of gastritis and Schatzki ring dilation in the past (last 09/2010), but EGD and Colonoscopy by Dr. Cristina Gong (03/26/2012) without evidence of ring and gastritis evident. Dysphagia mostly to solids. Positive FOBT in the ED, and very mild drop in Hemoglobin 11.7 from 12.4. Persistently tachycardic in the ED, but now resolved. No sick contacts. Passing gas and having BM's.  - Admitted to telemetry. - Trending CBC's. 11.5 > 10.5 > 11 - IV ppi >> Oral ppi 40mg  BID. Per GI recs.  - GI recs: s/p pill Barium Swallow this am. Awaiting their recs Re: EGD 2/16.  - On MIVF - Diet advanced to Dysphagia 3 >> will start NPO this am pending GI recs re: EGD.   - Soft BP's > given 1L bolus this am. Will f/u today.  - Phenergan for nauea (pt. Has Zofran allergy) - Continue to trend BMETs - KUB unremarkable.   - Prior to  d/c, will need some education around use of NSAIDs, soda intake  Hypotension: Pt. With soft BP's overnight to upper and mid 40'G systolic over upper 86'P diastolic. Upon further questioning this am, patient reports some diaphoresis yesterday after barium swallow and some continued nausea. Low risk at this time given age and limited risk factors.  - EKG this am.  - Will cycle troponins as a caution.  - will await these results.  - s/p 1L bolus - Continue to monitor VS.   History of DVT - History of impaired venous return. Slight trace swelling L > R to exam, however nontender to palpation. Tachycardic initially now resolved, but Well's score actually 0.  - SCD's for now given GIB.  - No O2 requirement at this time, Tachycardia resolved.  - Continue SCD's for ppx and monitor clinically.    Folliculitis - Pt. With well-healed / healing small infected follicles on bilateral breasts and left buttock. She was on Bactrim and finished this prior to being started on a course of Doxycycline. Afebrile here. WBC 9, though slight left shift most likely 2/2 vomiting.  - Continue doxycycline 100 BID >> stop date 1/24.   Fibromyalgia - On Elavil as an outpatient.  - Continue home Elavil here.   FEN/GI: NPO for now; s/p 1L NS bolus; ordered 2nd liter followed by 12 hours MIVF Prophylaxis: SCD's given current GI bleed  Disposition: Pending barium swallow and EGD.   Subjective:  Somewhat weak overnight. Slept well. Feels somewhat weak intermittently. Reports  that she has some slightly low BP's at baseline to low 100's per patient. She does endorse some diaphoresis yesterday and continued nausea. She would like phenergan this am given that she is NPO.   Objective: Temp:  [98.1 F (36.7 C)-98.6 F (37 C)] 98.1 F (36.7 C) (02/16 0450) Pulse Rate:  [80-86] 82 (02/16 0450) Resp:  [16-18] 16 (02/16 0450) BP: (84-98)/(48-58) 84/48 mmHg (02/16 0450) SpO2:  [96 %-100 %] 96 % (02/16 0450) Physical  Exam: General: NAD, AAOx3 Cardiovascular: RRR, No MGR, Normal S1/S2, 2+ distal pulses Respiratory: CTA B, No wheezes, crackles, or rales, appropriate rate, unlabored.  Abdomen: S, NT, ND, +BS Extremities: WWP, 2+ Distal pulses, moving all extremities well.  Skin: Reexamined areas of folliculitis noted on admission. Appear to be healing well and without worsening erythema, or drainage. Healing well. No other lesions or rashes.  Neuro: no focal deficits. AAOx3  Laboratory:  Recent Labs Lab 02/22/14 0615 02/22/14 1710 02/23/14 0647  WBC 4.3 4.1 4.2  HGB 20.1* 11.5* 10.5*  HCT 57.4* 36.1 32.8*  PLT 97* 362 346    Recent Labs Lab 02/21/14 2250 02/22/14 0615 02/23/14 0647  NA 137 138 139  K 3.9 3.7 3.6  CL 105 110 110  CO2 21 20 24   BUN 17 12 8   CREATININE 0.80 0.66 0.60  CALCIUM 8.7 7.9* 8.2*  PROT 7.2 5.4*  --   BILITOT 0.3 0.3  --   ALKPHOS 90 71  --   ALT 13 10  --   AST 23 16  --   GLUCOSE 123* 88 88   FOBT - positive.   Lipase - 35  Iron Panel  Iron - 47 UIBC - 296 TIBC - 343 Saturation - 14 Ferritin - 17 Folate - 8.8  B12 - 332   Imaging/Diagnostic Tests:  KUB 2/14 - Nonobstructive bowel gas pattern  Aquilla Hacker, MD 02/24/2014, 7:28 AM PGY-1, [place] Intern pager: [phone], text pages welcome

## 2014-02-24 NOTE — Discharge Instructions (Signed)
Dysphagia Swallowing problems (dysphagia) occur when solids and liquids seem to stick in your throat on the way down to your stomach, or the food takes longer to get to the stomach. Other symptoms include regurgitating food, noises coming from the throat, chest discomfort with swallowing, and a feeling of fullness or the feeling of something being stuck in your throat when swallowing. When blockage in your throat is complete, it may be associated with drooling. CAUSES  Problems with swallowing may occur because of problems with the muscles. The food cannot be propelled in the usual manner into your stomach. You may have ulcers, scar tissue, or inflammation in the tube down which food travels from your mouth to your stomach (esophagus), which blocks food from passing normally into the stomach. Causes of inflammation include:  Acid reflux from your stomach into your esophagus.  Infection.  Radiation treatment for cancer.  Medicines taken without enough fluids to wash them down into your stomach. You may have nerve problems that prevent signals from being sent to the muscles of your esophagus to contract and move your food down to your stomach. Globus pharyngeus is a relatively common problem in which there is a sense of an obstruction or difficulty in swallowing, without any physical abnormalities of the swallowing passages being found. This problem usually improves over time with reassurance and testing to rule out other causes. DIAGNOSIS Dysphagia can be diagnosed and its cause can be determined by tests in which you swallow a white substance that helps illuminate the inside of your throat (contrast medium) while X-rays are taken. Sometimes a flexible telescope that is inserted down your throat (endoscopy) to look at your esophagus and stomach is used. TREATMENT   If the dysphagia is caused by acid reflux or infection, medicines may be used.  If the dysphagia is caused by problems with your  swallowing muscles, swallowing therapy may be used to help you strengthen your swallowing muscles.  If the dysphagia is caused by a blockage or mass, procedures to remove the blockage may be done. HOME CARE INSTRUCTIONS  Try to eat soft food that is easier to swallow and check your weight on a daily basis to be sure that it is not decreasing.  Be sure to drink liquids when sitting upright (not lying down). SEEK MEDICAL CARE IF:  You are losing weight because you are unable to swallow.  You are coughing when you drink liquids (aspiration).  You are coughing up partially digested food. SEEK IMMEDIATE MEDICAL CARE IF:  You are unable to swallow your own saliva .  You are having shortness of breath or a fever, or both.  You have a hoarse voice along with difficulty swallowing. MAKE SURE YOU:  Understand these instructions.  Will watch your condition.  Will get help right away if you are not doing well or get worse. Document Released: 12/24/1999 Document Revised: 05/12/2013 Document Reviewed: 06/14/2012 [facility] Patient Information 2015 [place], [place]. This information is not intended to replace advice given to you by your health care provider. Make sure you discuss any questions you have with your health care provider.   We are glad that you are doing better.   You have become stable enough to discharge home, however it is important to continue to monitor your self. If you begin to have intractible vomiting, or vomiting with blood in it, then you should return to the ED for evaluation.   You will be seen by Dr. Cristina Gong at 3:30 pm tomorrow for  further evaluation and outpatient EGD.   Thanks for letting us take care of you!   Sincerely, Paula Compton, MD Family Medicine - PGY 1

## 2014-02-27 ENCOUNTER — Telehealth: Payer: Self-pay | Admitting: Family Medicine

## 2014-02-27 NOTE — Telephone Encounter (Signed)
Will forward to MD to see what he suggest. Dominique [name]

## 2014-02-27 NOTE — Telephone Encounter (Signed)
Must be allergic to antibotic. Got sick yesterday. She had taken it while in hospital Please advsie

## 2014-02-27 NOTE — Telephone Encounter (Signed)
Called patient to discuss her allergic rxn to doxy; she hasn't taken doxy for 2 days due to nausea with doxy. Denies hives or breathing difficult. Her folliculitis is improved. Discussed stopping doxy if she feels it is causing her nausea and doing bleach baths weekly. She has followed up with GI and scheduled for EGD in next month.

## 2014-03-09 ENCOUNTER — Ambulatory Visit (INDEPENDENT_AMBULATORY_CARE_PROVIDER_SITE_OTHER): Payer: No Typology Code available for payment source | Admitting: Family Medicine

## 2014-03-09 VITALS — BP 98/74 | HR 96 | Temp 98.3°F | Resp 16 | Wt 197.0 lb

## 2014-03-09 DIAGNOSIS — D509 Iron deficiency anemia, unspecified: Secondary | ICD-10-CM | POA: Insufficient documentation

## 2014-03-09 DIAGNOSIS — Z86718 Personal history of other venous thrombosis and embolism: Secondary | ICD-10-CM

## 2014-03-09 DIAGNOSIS — M797 Fibromyalgia: Secondary | ICD-10-CM

## 2014-03-09 DIAGNOSIS — R52 Pain, unspecified: Secondary | ICD-10-CM

## 2014-03-09 DIAGNOSIS — K2970 Gastritis, unspecified, without bleeding: Secondary | ICD-10-CM

## 2014-03-09 DIAGNOSIS — D649 Anemia, unspecified: Secondary | ICD-10-CM

## 2014-03-09 LAB — CBC
HEMATOCRIT: 35.5 % — AB (ref 36.0–46.0)
HEMOGLOBIN: 11.7 g/dL — AB (ref 12.0–15.0)
MCH: 29.7 pg (ref 26.0–34.0)
MCHC: 33 g/dL (ref 30.0–36.0)
MCV: 90.1 fL (ref 78.0–100.0)
MPV: 8.9 fL (ref 8.6–12.4)
Platelets: 513 10*3/uL — ABNORMAL HIGH (ref 150–400)
RBC: 3.94 MIL/uL (ref 3.87–5.11)
RDW: 14.5 % (ref 11.5–15.5)
WBC: 8.7 10*3/uL (ref 4.0–10.5)

## 2014-03-09 LAB — HEPATITIS C ANTIBODY: HCV Ab: NEGATIVE

## 2014-03-09 MED ORDER — IRON 325 (65 FE) MG PO TABS: 325.0000 mg | ORAL_TABLET | Freq: Every day | ORAL | Status: DC

## 2014-03-09 MED ORDER — OMEPRAZOLE 40 MG PO CPDR: 40.0000 mg | DELAYED_RELEASE_CAPSULE | Freq: Two times a day (BID) | ORAL | Status: DC

## 2014-03-09 MED ORDER — AMITRIPTYLINE HCL 10 MG PO TABS: 10.0000 mg | ORAL_TABLET | Freq: Every day | ORAL | Status: DC

## 2014-03-09 MED ORDER — MELOXICAM 7.5 MG PO TABS: 7.5000 mg | ORAL_TABLET | Freq: Every day | ORAL | Status: DC
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
## 2014-03-09 NOTE — Assessment & Plan Note (Signed)
Possible recent GI and hemorrhoids. Hx of iron deficiency anemia - start PO iron

## 2014-03-09 NOTE — Patient Instructions (Signed)
It was great seeing you today.   1. Continue taking Prilosec 40mg  twice a day 2. Take mobic 7.5 mg daily as needed. Discuss the pros and cons of this with your GI doctor 3. Increase Elavil to 15 mg every night for the next week. Then increase by 5 mg every week (7 days) until taking 25 mg nightly   Please bring all your medications to every doctors visit  Sign up for My Chart to have easy access to your labs results, and communication with your Primary care physician.  Next Appointment  Please make an appointment with Dr Berkley Harvey in 3-4 weeks   I look forward to talking with you again at our next visit. If you have any questions or concerns before then, please call the clinic at [phone].  Take Care,   Dr Phill Myron

## 2014-03-09 NOTE — Assessment & Plan Note (Addendum)
Possible recent GI bleed - Scheduled for EGD - Currently still taking mobic. Discussed risk/benefits. She opted to continue mobic for now and discuss with her Gastrologist after EGD - Continue PPI BID - check cbc

## 2014-03-09 NOTE — Progress Notes (Signed)
  Patient name: Dominique [name] MRN [id number]  Date of birth: [birth year]  CC & HPI:  Dominique [name] is a 39 y.o. female presenting today for hospital f/u and fibromyalgia.   Hospital f/u for possible GI bleed - denies dizziness, syncope or bleeding since leaving the hospital - She continue to take mobic as it helps tremendously with her pain - Continue to take PPI BID - Scheduled for EGD on 3/24 - No hx of prior GI bleed  Fibromyalgia - Improved mood and pain with elavil - Mobic improves LE pain significantly - Starting to exercise - walks on treadmill 10 min 3 x week - Has cut back to working 2 days a week  - Currently applying for disability  - Loss of her parent in the past year has been difficult  ROS: See HPI   Medical & Surgical Hx:  Reviewed  Medications & Allergies: Reviewed  Social History: Reviewed:   Objective Findings:  Vitals: BP 98/74 mmHg  Pulse 96  Temp(Src) 98.3 F (36.8 C) (Oral)  Resp 16  Wt 197 lb (89.359 kg)  Gen: NAD CV: RRR w/o m/r/g, pulses +2 b/l Resp: CTAB w/ normal respiratory effort Lower Ext: trace edema b/l ; distal pulses intact; Calves nontender  Assessment & Plan:   Please See Problem Focused Assessment & Plan

## 2014-03-10 LAB — VITAMIN D 25 HYDROXY (VIT D DEFICIENCY, FRACTURES): Vit D, 25-Hydroxy: 26 ng/mL — ABNORMAL LOW (ref 30–100)

## 2014-03-10 LAB — HIV ANTIBODY (ROUTINE TESTING W REFLEX): HIV 1&2 Ab, 4th Generation: NONREACTIVE

## 2014-03-10 LAB — ANA: Anti Nuclear Antibody(ANA): NEGATIVE

## 2014-03-11 ENCOUNTER — Telehealth: Payer: Self-pay | Admitting: Family Medicine

## 2014-03-11 NOTE — Telephone Encounter (Signed)
Pt is suppose to see you in 3 wks from last Monday but there isn't a continuity sched for the week of the 21st.  Please advise.

## 2014-03-12 NOTE — Telephone Encounter (Signed)
How about the week after 3/21-3/25. I would be happy to see her on any of my sameday apt.

## 2014-03-20 ENCOUNTER — Encounter: Payer: Self-pay | Admitting: Family Medicine

## 2014-03-20 NOTE — Progress Notes (Signed)
Patient needs a letter from Dr. Berkley Harvey stating her disability before the 21st of March if possible / pt can be reached at [phone] / sr

## 2014-03-20 NOTE — Progress Notes (Signed)
I'm not sure what she is asking for? She talked about applying for disability at her last office visit and I told her she would have to apply and that we don't evaluate for disability. They're are disability doctor's that would have to evaluate her. I can't "state" or say that she is disabled. She can request her medical records with my notes in which I've documented her symptoms and work-up. Please call to advise and clarify. I'm on vacation the week of 3/12 -3/20.

## 2014-03-23 ENCOUNTER — Encounter: Payer: Self-pay | Admitting: *Deleted

## 2014-03-24 ENCOUNTER — Telehealth: Payer: Self-pay | Admitting: Family Medicine

## 2014-03-24 ENCOUNTER — Encounter (HOSPITAL_COMMUNITY): Payer: Self-pay | Admitting: *Deleted

## 2014-03-24 NOTE — Telephone Encounter (Signed)
Pt is returning Jazmin's call from yesterday

## 2014-03-25 ENCOUNTER — Telehealth: Payer: Self-pay | Admitting: Family Medicine

## 2014-03-25 NOTE — Telephone Encounter (Signed)
She would like this to a Rheumatologist. jw

## 2014-03-25 NOTE — Telephone Encounter (Signed)
Pt called and needs a referral to see a specialist about Fibromyalgia.

## 2014-03-25 NOTE — Telephone Encounter (Signed)
Spoke with patient and she just needs a few office notes explaining her fibromyalgia.  She is fine with these and will have disability contact us if they need more information.  Vernetta Dizdarevic,CMA

## 2014-03-25 NOTE — Telephone Encounter (Signed)
Pt would like to see a new rheumatologist for a second opinion.  She was told that it was probably fibromyalgia.  She is aware that she will need to come by and sign a release of information to get his records before we can send her anywhere else.  Also she will need to contact [place] [place] to see who they have in network. Dominique [name],CMA

## 2014-03-30 ENCOUNTER — Other Ambulatory Visit: Payer: Self-pay | Admitting: Gastroenterology

## 2014-04-02 ENCOUNTER — Encounter (HOSPITAL_COMMUNITY): Payer: Self-pay

## 2014-04-02 ENCOUNTER — Ambulatory Visit (HOSPITAL_COMMUNITY)
Admission: RE | Admit: 2014-04-02 | Discharge: 2014-04-02 | Disposition: A | Discharge status: Home / Self Care | Payer: No Typology Code available for payment source | Source: Ambulatory Visit | Attending: Gastroenterology | Admitting: Gastroenterology

## 2014-04-02 ENCOUNTER — Ambulatory Visit (HOSPITAL_COMMUNITY): Payer: No Typology Code available for payment source | Admitting: Anesthesiology

## 2014-04-02 ENCOUNTER — Encounter (HOSPITAL_COMMUNITY)
Admission: RE | Disposition: A | Discharge status: Home / Self Care | Payer: Self-pay | Source: Ambulatory Visit | Attending: Gastroenterology

## 2014-04-02 DIAGNOSIS — M79606 Pain in leg, unspecified: Secondary | ICD-10-CM | POA: Insufficient documentation

## 2014-04-02 DIAGNOSIS — Z79899 Other long term (current) drug therapy: Secondary | ICD-10-CM | POA: Diagnosis not present

## 2014-04-02 DIAGNOSIS — K222 Esophageal obstruction: Secondary | ICD-10-CM | POA: Diagnosis not present

## 2014-04-02 DIAGNOSIS — I7300 Raynaud's syndrome without gangrene: Secondary | ICD-10-CM | POA: Insufficient documentation

## 2014-04-02 DIAGNOSIS — D649 Anemia, unspecified: Secondary | ICD-10-CM | POA: Diagnosis not present

## 2014-04-02 DIAGNOSIS — M797 Fibromyalgia: Secondary | ICD-10-CM | POA: Diagnosis not present

## 2014-04-02 DIAGNOSIS — Z792 Long term (current) use of antibiotics: Secondary | ICD-10-CM | POA: Insufficient documentation

## 2014-04-02 DIAGNOSIS — E669 Obesity, unspecified: Secondary | ICD-10-CM | POA: Insufficient documentation

## 2014-04-02 DIAGNOSIS — Z86718 Personal history of other venous thrombosis and embolism: Secondary | ICD-10-CM | POA: Diagnosis not present

## 2014-04-02 DIAGNOSIS — Z6841 Body Mass Index (BMI) 40.0 and over, adult: Secondary | ICD-10-CM | POA: Diagnosis not present

## 2014-04-02 DIAGNOSIS — I739 Peripheral vascular disease, unspecified: Secondary | ICD-10-CM | POA: Diagnosis not present

## 2014-04-02 DIAGNOSIS — R1310 Dysphagia, unspecified: Secondary | ICD-10-CM | POA: Diagnosis present

## 2014-04-02 DIAGNOSIS — G8929 Other chronic pain: Secondary | ICD-10-CM | POA: Insufficient documentation

## 2014-04-02 DIAGNOSIS — K219 Gastro-esophageal reflux disease without esophagitis: Secondary | ICD-10-CM | POA: Diagnosis not present

## 2014-04-02 DIAGNOSIS — Z791 Long term (current) use of non-steroidal anti-inflammatories (NSAID): Secondary | ICD-10-CM | POA: Insufficient documentation

## 2014-04-02 HISTORY — PX: ESOPHAGOGASTRODUODENOSCOPY (EGD) WITH PROPOFOL: SHX5813

## 2014-04-02 HISTORY — PX: BALLOON DILATION: SHX5330

## 2014-04-02 SURGERY — ESOPHAGOGASTRODUODENOSCOPY (EGD) WITH PROPOFOL
Anesthesia: Monitor Anesthesia Care

## 2014-04-02 MED ORDER — PROPOFOL 10 MG/ML IV BOLUS
INTRAVENOUS | Status: AC
  Filled 2014-04-02: qty 20

## 2014-04-02 MED ORDER — SODIUM CHLORIDE 0.9 % IV SOLN: INTRAVENOUS | Status: DC

## 2014-04-02 MED ORDER — LACTATED RINGERS IV SOLN
INTRAVENOUS | Status: DC | PRN
  Administered 2014-04-02: 12:00:00 via INTRAVENOUS

## 2014-04-02 MED ORDER — LIDOCAINE HCL (CARDIAC) 20 MG/ML IV SOLN
INTRAVENOUS | Status: AC
  Filled 2014-04-02: qty 5

## 2014-04-02 MED ORDER — ONDANSETRON HCL 4 MG/2ML IJ SOLN
INTRAMUSCULAR | Status: AC
Start: 2014-04-02 — End: 2014-04-02
  Filled 2014-04-02: qty 2

## 2014-04-02 MED ORDER — BUTAMBEN-TETRACAINE-BENZOCAINE 2-2-14 % EX AERO
INHALATION_SPRAY | CUTANEOUS | Status: DC | PRN
  Administered 2014-04-02: 2 via TOPICAL

## 2014-04-02 MED ORDER — LACTATED RINGERS IV SOLN
INTRAVENOUS | Status: DC
  Administered 2014-04-02: 1000 mL via INTRAVENOUS

## 2014-04-02 SURGICAL SUPPLY — 14 items

## 2014-04-02 NOTE — Discharge Instructions (Signed)
Esophagogastroduodenoscopy °Care After °Refer to this sheet in the next few weeks. These instructions provide you with information on caring for yourself after your procedure. Your caregiver may also give you more specific instructions. Your treatment has been planned according to current medical practices, but problems sometimes occur. Call your caregiver if you have any problems or questions after your procedure.  °HOME CARE INSTRUCTIONS °· Do not eat or drink anything until the numbing medicine (local anesthetic) has worn off and your gag reflex has returned. You will know that the local anesthetic has worn off when you can swallow comfortably. °· Do not drive for 12 hours after the procedure or as directed by your caregiver. °· Only take medicines as directed by your caregiver. °SEEK MEDICAL CARE IF:  °· You cannot stop coughing. °· You are not urinating at all or less than usual. °SEEK IMMEDIATE MEDICAL CARE IF: °· You have difficulty swallowing. °· You cannot eat or drink. °· You have worsening throat or chest pain. °· You have dizziness, lightheadedness, or you faint. °· You have nausea or vomiting. °· You have chills. °· You have a fever. °· You have severe abdominal pain. °· You have black, tarry, or bloody stools. °Document Released: 12/13/2011 Document Reviewed: 12/13/2011 °ExitCare® Patient Information ©2015 [employer]. This information is not intended to replace advice given to you by your health care provider. Make sure you discuss any questions you have with your health care provider. ° °

## 2014-04-02 NOTE — H&P (Signed)
Dominique [name] is an 39 y.o. female.   Chief Complaint: Dysphagia HPI: This 39 year old female with a prior problem of heavy to a she and to prescription pain medications has had a recent recurrence of dysphagia symptoms. Approximately 4 years ago, she underwent esophageal dilatation to 18 mm by Savary technique, but that exam was complicated by an apparent pharyngeal abrasion which led to a significant sore throat for several weeks following the procedure. Recently, in association with the recurrent dysphagia symptoms, a barium swallow was obtained which confirmed distal esophageal narrowing with hang-up of the barium tablet, so repeat dilatation is felt to be needed.  She did have a recent admission for hematemesis at which time she was not endoscoped. She does use Motrin regularly because of chronic leg pain. It has been hard for her to eat solid food for over a month now, but she has been getting by with liquids and soft foods, and consequently, has only lost a few pounds.  Past Medical History  Diagnosis Date  . Raynaud's disease   . DVT (deep venous thrombosis)   . Raynaud disease 12/21/2011  . Varicose veins   . GERD (gastroesophageal reflux disease)     Past Surgical History  Procedure Laterality Date  . Abdominal hysterectomy    . Cesarean section    . Appendectomy    . Hernia repair  2006  . Endovenous ablation saphenous vein w/ laser  02-01-2012    right greater saphenous vein and stab phlebectomy 10-20 incisions right leg  by Curt Jews, MD  . Endovenous ablation saphenous vein w/ laser Left 02-29-2012    left greater saphenous vein and stab phlebectomy left leg 10-20 incisions  by Curt Jews MD  . Esophagogastroduodenoscopy (egd) with propofol N/A 03/26/2012    Procedure: ESOPHAGOGASTRODUODENOSCOPY (EGD) WITH PROPOFOL;  Surgeon: Cleotis Nipper, MD;  Location: [facility];  Service: Endoscopy;  Laterality: N/A;  . Colonoscopy with propofol N/A 03/26/2012    Procedure:  COLONOSCOPY WITH PROPOFOL;  Surgeon: Cleotis Nipper, MD;  Location: [facility];  Service: Endoscopy;  Laterality: N/A;  . Endovenous ablation saphenous vein w/ laser Left     Family History  Problem Relation Age of Onset  . Diabetes Mother   . Heart disease Mother   . Hyperlipidemia Mother   . Hypertension Mother   . Other Mother     amputation, varicose veins  . Diabetes Father   . Hyperlipidemia Father   . Hypertension Father   . Peripheral vascular disease Father   . Other Father     varicose veins  . Deep vein thrombosis Brother   . Other Brother     varicose veins   Social History:  reports that she has never smoked. She has never used smokeless tobacco. She reports that she does not drink alcohol or use illicit drugs.  Allergies:  Allergies  Allergen Reactions  . Contrast Media [Iodinated Diagnostic Agents] Anaphylaxis and Swelling    Tongue swelling  . Latex Anaphylaxis  . Zofran [Ondansetron Hcl] Other (See Comments)    Hives, numbness of legs with previous use  . Doxycycline     MADE LEGS HURT  . Eggs Or Egg-Derived Products Nausea And Vomiting  . Lactose Intolerance (Gi) Diarrhea  . Nsaids     Worsened gastritis; 02/22/14 GI bleed    Medications Prior to Admission  Medication Sig Dispense Refill  . acetaminophen (TYLENOL) 325 MG tablet Take 650 mg by mouth every 6 (six) hours as needed for  moderate pain.    Marland Kitchen amitriptyline (ELAVIL) 10 MG tablet Take 1 tablet (10 mg total) by mouth at bedtime. 30 tablet 2  . Ferrous Sulfate (IRON) 325 (65 FE) MG TABS Take 325 mg by mouth daily. 30 each 5  . hyoscyamine (LEVSIN SL) 0.125 MG SL tablet Take 0.125 mg by mouth every 6 (six) hours as needed for cramping.     . meloxicam (MOBIC) 7.5 MG tablet Take 1 tablet (7.5 mg total) by mouth daily. 30 tablet 0  . Multiple Vitamin (MULTIVITAMIN WITH MINERALS) TABS Take 1 tablet by mouth daily.    . mupirocin ointment (BACTROBAN) 2 % Apply to both nostrils TID for 1 month. 22 g  1  . omeprazole (PRILOSEC) 40 MG capsule Take 1 capsule (40 mg total) by mouth 2 (two) times daily before a meal. 60 capsule 0  . promethazine (PHENERGAN) 25 MG tablet Take 1 tablet (25 mg total) by mouth every 6 (six) hours as needed for nausea. 30 tablet 1  . valACYclovir (VALTREX) 1000 MG tablet Take 0.5 tablets (500 mg total) by mouth 2 (two) times daily. 7 tablet 12    No results found for this or any previous visit (from the past 48 hour(s)). No results found.  ROS see history of present illness  There were no vitals taken for this visit. Physical Exam vital signs are normal (although not yet recorded in the computer). The patient is in no distress. She is in good spirits and cognitively intact. Mental status grossly normal, does not appear anxious or depressed. Chest is clear. Heart is without murmur or arrhythmia. Abdomen severely obese, but nontender.  Assessment/Plan Impression: Recurrent dysphagia symptoms in association with documented distal esophageal narrowing on recent barium swallow.  Plan: Esophageal dilatation, by through-the-scope technique in view of previous pharyngeal abrasion when performed by Savary technique.  Asianna Brundage V 04/02/2014, 11:44 AM

## 2014-04-02 NOTE — Anesthesia Preprocedure Evaluation (Addendum)
Anesthesia Evaluation  Patient identified by MRN, date of birth, ID band Patient awake    Reviewed: Allergy & Precautions, NPO status , Patient's Chart, lab work & pertinent test results  Airway Mallampati: II  TM Distance: >3 FB Neck ROM: Full    Dental  (+) Edentulous Upper   Pulmonary neg pulmonary ROS,  breath sounds clear to auscultation  Pulmonary exam normal       Cardiovascular + Peripheral Vascular Disease Rhythm:Regular Rate:Normal     Neuro/Psych  Neuromuscular disease negative psych ROS   GI/Hepatic Neg liver ROS, GERD-  Medicated,  Endo/Other  negative endocrine ROS  Renal/GU negative Renal ROS  negative genitourinary   Musculoskeletal  (+) Fibromyalgia -  Abdominal (+) + obese,   Peds negative pediatric ROS (+)  Hematology  (+) anemia ,   Anesthesia Other Findings   Reproductive/Obstetrics negative OB ROS                            Anesthesia Physical Anesthesia Plan  ASA: II  Anesthesia Plan: MAC   Post-op Pain Management:    Induction: Intravenous  Airway Management Planned:   Additional Equipment:   Intra-op Plan:   Post-operative Plan:   Informed Consent: I have reviewed the patients History and Physical, chart, labs and discussed the procedure including the risks, benefits and alternatives for the proposed anesthesia with the patient or authorized representative who has indicated his/her understanding and acceptance.   Dental advisory given  Plan Discussed with: CRNA  Anesthesia Plan Comments:         Anesthesia Quick Evaluation

## 2014-04-02 NOTE — Op Note (Signed)
[facility] [place], [address]   ENDOSCOPY PROCEDURE REPORT  PATIENT: Dominique, [name]  MR#: [id number] BIRTHDATE: May 18, 1975 , 38  yrs. old GENDER: female ENDOSCOPIST:Warner Laduca, MD REFERRED BY:  [facility] PROCEDURE DATE:  04-13-14 PROCEDURE:   upper endoscopy with through-the-scope balloon dilatation of the esophagus ASA CLASS: INDICATIONS: recurrent dysphagia, documented distal esophageal narrowing with hang-up of barium tablet on recent barium swallow MEDICATION:   MAC--propofol and Ketamine TOPICAL ANESTHETIC:   Cetacaine  DESCRIPTION OF PROCEDURE:   After the risks and benefits of the procedure were explained, informed consent was obtained.the patient came as an outpatient to the [facility] long endoscopy unit  The Pentax adult video       endoscope was introduced through the mouth  and advanced to the second portion of the duodenum .  The instrument was slowly withdrawn as the mucosa was fully examined. Estimated blood loss is zero unless otherwise noted in this procedure report.    the scope was passed under direct vision but the larynx was not well seen.         the esophagus was entered without significant difficulty.        there was no evident esophagitis. At the squamocolumnar junction, there was an esophageal mucosal ring consistent with a Schatzki's ring, offering no resistance to passage of the 10 mm endoscope. There was no reflux esophagitis, nor any varices, infection, neoplasia, Barrett's esophagus. No hiatal hernia was appreciated.  The stomach was entered. It contained a small clear residual that was suctioned out. The gastric mucosa was normal, without evidence of gastritis, erosions, ulcers, polyps, or masses. A retroflexed view of the cardia was normal.  The pylorus, duodenal bulb, and second duodenum looked normal.  Through-the-scope balloon dilatation was done with a balloon inflated to  16.5 mm for half a minute, and then 18 mm for 1 minute. There was no mucosal disruption with the initial balloon inflation, but at 18 mm, there was slight mucosal disruption with a linear, slightly furrowed mucosal laceration leading down to the esophageal ring. This was oozing blood for a couple of minutes and was therefore observed; at the conclusion of the procedure, there was minimal oozing still present. There was no evidence of perforation or undue trauma to the esophagus. There was a small abrasion on the greater curve aspect of the stomach, probably from the tip of the balloon tip catheter. The scope was then withdrawn from the patient and the procedure completed.  COMPLICATIONS: There were no immediate complications.  ENDOSCOPIC IMPRESSION: 1. 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 2. Otherwise normal endoscopy.  RECOMMENDATIONS: Clinical follow-up of dysphagia symptoms.   _______________________________ eSignedRonald Lobo, MD April 13, 2014 12:35 PM     cc:  CPT CODES: ICD CODES:  The ICD and CPT codes recommended by this software are interpretations from the data that the clinical staff has captured with the software.  The verification of the translation of this report to the ICD and CPT codes and modifiers is the sole responsibility of the health care institution and practicing physician where this report was generated.  [employer] will not be held responsible for the validity of the ICD and CPT codes included on this report.  AMA assumes no liability for data contained or not contained herein. CPT is a Designer, television/film set of the [employer].  PATIENT NAME:  Dominique, [name] MR#: [id number]

## 2014-04-02 NOTE — Anesthesia Postprocedure Evaluation (Signed)
  Anesthesia Post-op Note  Patient: Dominique [name]  Procedure(s) Performed: Procedure(s) (LRB): ESOPHAGOGASTRODUODENOSCOPY (EGD) WITH PROPOFOL (N/A) BALLOON DILATION (N/A)  Patient Location: PACU  Anesthesia Type: MAC  Level of Consciousness: awake and alert   Airway and Oxygen Therapy: Patient Spontanous Breathing  Post-op Pain: mild  Post-op Assessment: Post-op Vital signs reviewed, Patient's Cardiovascular Status Stable, Respiratory Function Stable, Patent Airway and No signs of Nausea or vomiting  Last Vitals:  Filed Vitals:   04/02/14 1310  BP: 83/43  Pulse: 66  Temp:   Resp: 15    Post-op Vital Signs: stable   Complications: No apparent anesthesia complications

## 2014-04-02 NOTE — Transfer of Care (Signed)
Immediate Anesthesia Transfer of Care Note  Patient: Dominique [name]  Procedure(s) Performed: Procedure(s) (LRB): ESOPHAGOGASTRODUODENOSCOPY (EGD) WITH PROPOFOL (N/A) BALLOON DILATION (N/A)  Patient Location: PACU  Anesthesia Type: MAC  Level of Consciousness: sedated, patient cooperative and responds to stimulation  Airway & Oxygen Therapy: Patient Spontanous Breathing and Patient connected to face mask oxgen  Post-op Assessment: Report given to PACU RN and Post -op Vital signs reviewed and stable  Post vital signs: Reviewed and stable  Complications: No apparent anesthesia complications

## 2014-04-06 ENCOUNTER — Encounter (HOSPITAL_COMMUNITY): Payer: Self-pay | Admitting: Gastroenterology

## 2014-04-06 DIAGNOSIS — Z0279 Encounter for issue of other medical certificate: Secondary | ICD-10-CM | POA: Diagnosis not present

## 2014-04-10 ENCOUNTER — Ambulatory Visit (INDEPENDENT_AMBULATORY_CARE_PROVIDER_SITE_OTHER): Payer: No Typology Code available for payment source | Admitting: Family Medicine

## 2014-04-10 ENCOUNTER — Encounter: Payer: Self-pay | Admitting: Family Medicine

## 2014-04-10 VITALS — BP 109/73 | HR 123 | Temp 98.5°F | Ht <= 58 in | Wt 197.1 lb

## 2014-04-10 DIAGNOSIS — R52 Pain, unspecified: Secondary | ICD-10-CM | POA: Diagnosis not present

## 2014-04-10 DIAGNOSIS — M797 Fibromyalgia: Secondary | ICD-10-CM

## 2014-04-10 MED ORDER — AMITRIPTYLINE HCL 10 MG PO TABS: 25.0000 mg | ORAL_TABLET | Freq: Every day | ORAL | Status: DC

## 2014-04-10 MED ORDER — MELOXICAM 15 MG PO TABS: 15.0000 mg | ORAL_TABLET | Freq: Every day | ORAL | Status: DC

## 2014-04-10 NOTE — Patient Instructions (Signed)
It was great seeing you today.   1. Increase Mobic to 15mg  once a day 2. Increase Elavil by 5 mg every 1 to 2 until you get to 25 mg. Then call and let me know how you are doing.    Please bring all your medications to every doctors visit  Sign up for My Chart to have easy access to your labs results, and communication with your Primary care physician.  Next Appointment  Please call to make an appointment with Dr Berkley Harvey in 2 month   I look forward to talking with you again at our next visit. If you have any questions or concerns before then, please call the clinic at [phone].  Take Care,   Dr Phill Myron

## 2014-04-10 NOTE — Assessment & Plan Note (Signed)
-   Again, advised increase in Elavil by 5 mg every 1-2 weeks until taking 25 mg daily at bedtime - Increase Mobic to 15 mg daily; Continue PPI - Continue to encourage exercise and weight loss - She previously requested second opinion by another rheumatologist; however, today she says she has been reading about fibromyalgia and is coming to terms with this diagnosis - She will call once she reaches 25 mg of Elavil to report symptoms.  We'll consider continuing to increase to 50 mg daily at bedtime vs starting SSRI/SNRI vs starting gabapentin/Lyrica - pending degree of symptoms at that time

## 2014-04-10 NOTE — Progress Notes (Signed)
  Patient name: Rylei Masella MRN [id number]  Date of birth: 04/16/1975  CC & HPI:  Deajah Erkkila is a 39 y.o. female presenting today for her for fibromyalgia.  She reports stopping Elavil for a couple of days and noticing that her pain worsened.  She since restarted it at 10 mg daily nightly with improvement in her pain, however, she did not increase to 25 mg as previously discussed.  She continues to take Mobic 7.5 mg daily with improvement in her pain; he discussed this with her gastroenterologist, and he agrees with continued NSAID use.  She continues to report numbness and burning sensations in her arms and legs.  Continues to endorse anxiety and sleep difficulties.  She reports previous chest pain with bilateral arm numbness; however, she did not go to ED for evaluation as instructed.  She denies any current chest pain, shortness of breath.   ROS: See HPI   Medical & Surgical Hx:  Reviewed  Medications & Allergies: Reviewed  Social History: Reviewed:   Objective Findings:  Vitals: BP 109/73 mmHg  Pulse 123  Temp(Src) 98.5 F (36.9 C) (Oral)  Ht 4\' 7"  (1.397 m)  Wt 197 lb 1.6 oz (89.404 kg)  BMI 45.81 kg/m2  Gen: NAD CV: Mild tachycardia w/o m/r/g, pulses +2 b/l Resp: CTAB w/ normal respiratory effort Lower Ext: No skin changes; No edema; distal pulses intact; Calves nontender  Assessment & Plan:   Please See Problem Focused Assessment & Plan

## 2014-05-08 ENCOUNTER — Encounter: Payer: Self-pay | Admitting: Family Medicine

## 2014-05-08 ENCOUNTER — Ambulatory Visit (INDEPENDENT_AMBULATORY_CARE_PROVIDER_SITE_OTHER): Payer: No Typology Code available for payment source | Admitting: Family Medicine

## 2014-05-08 DIAGNOSIS — L988 Other specified disorders of the skin and subcutaneous tissue: Secondary | ICD-10-CM

## 2014-05-08 DIAGNOSIS — N649 Disorder of breast, unspecified: Secondary | ICD-10-CM

## 2014-05-08 MED ORDER — FLUCONAZOLE 150 MG PO TABS: 150.0000 mg | ORAL_TABLET | Freq: Once | ORAL | Status: DC

## 2014-05-08 MED ORDER — SULFAMETHOXAZOLE-TRIMETHOPRIM 800-160 MG PO TABS: 1.0000 | ORAL_TABLET | Freq: Two times a day (BID) | ORAL | Status: DC

## 2014-05-08 NOTE — Progress Notes (Signed)
   Subjective:    Patient ID: Dominique [name], female    DOB: [birth year], 39 y.o.   MRN: [id number]  HPI  Skin irritation: Patient presents to family medicine clinic today for same-day appointment for left breast lesion. Patient has been seen at clinic a few months ago, and the urgent care for same reason. She was placed on doxycycline, given Bactroban ointment and Hibiclens. She states she used all of the above, and only seen mild improvement, but it never completely resolved. Patient washed all of her sheets, and purchased new Jeanelle Malling and attempt to kill any possible MRSA colonization. Patient states that the doxycycline gave her "leg weakness "and after she stopped the medication the side effects went completely away. She reports compliance through the whole course of doxycycline. She denies any fevers or swelling in the area. She denies any similar lesions elsewhere on her body.  Never smoker  Past Medical History  Diagnosis Date  . Raynaud's disease   . DVT (deep venous thrombosis)   . Raynaud disease 12/21/2011  . Varicose veins   . GERD (gastroesophageal reflux disease)    Allergies  Allergen Reactions  . Contrast Media [Iodinated Diagnostic Agents] Anaphylaxis and Swelling    Tongue swelling  . Latex Anaphylaxis  . Zofran [Ondansetron Hcl] Other (See Comments)    Hives, numbness of legs with previous use  . Doxycycline     MADE LEGS HURT  . Eggs Or Egg-Derived Products Nausea And Vomiting  . Lactose Intolerance (Gi) Diarrhea  . Nsaids     Worsened gastritis; 02/22/14 GI bleed   Past Surgical History  Procedure Laterality Date  . Abdominal hysterectomy    . Cesarean section    . Appendectomy    . Hernia repair  2006  . Endovenous ablation saphenous vein w/ laser  02-01-2012    right greater saphenous vein and stab phlebectomy 10-20 incisions right leg  by Curt Jews, MD  . Endovenous ablation saphenous vein w/ laser Left 02-29-2012    left greater saphenous vein and stab  phlebectomy left leg 10-20 incisions  by Curt Jews MD  . Esophagogastroduodenoscopy (egd) with propofol N/A 03/26/2012    Procedure: ESOPHAGOGASTRODUODENOSCOPY (EGD) WITH PROPOFOL;  Surgeon: Cleotis Nipper, MD;  Location: [facility];  Service: Endoscopy;  Laterality: N/A;  . Colonoscopy with propofol N/A 03/26/2012    Procedure: COLONOSCOPY WITH PROPOFOL;  Surgeon: Cleotis Nipper, MD;  Location: [facility];  Service: Endoscopy;  Laterality: N/A;  . Endovenous ablation saphenous vein w/ laser Left   . Esophagogastroduodenoscopy (egd) with propofol N/A 04/02/2014    Procedure: ESOPHAGOGASTRODUODENOSCOPY (EGD) WITH PROPOFOL;  Surgeon: Ronald Lobo, MD;  Location: [facility];  Service: Endoscopy;  Laterality: N/A;  . Balloon dilation N/A 04/02/2014    Procedure: BALLOON DILATION;  Surgeon: Ronald Lobo, MD;  Location: [facility];  Service: Endoscopy;  Laterality: N/A;   Review of Systems Per HPI    Objective:   Physical Exam BP 118/66 mmHg  Pulse 108  Temp(Src) 98.7 F (37.1 C) (Oral)  Ht 4\' 7"  (1.397 m)  Wt 192 lb 9.6 oz (87.363 kg)  BMI 44.76 kg/m2 Gen: NAD. Nontoxic in appearance, Caucasian female, obese. Well-developed, well-nourished, pleasant. Skin: Mildly erythemic lesion approximately 1 cm, round, mildly raised, thickened lesion inferior left breast. Small 1-2 mm fluctuance appreciated. Small amount of puslike drainage appreciated. No soft tissue swelling. 2 mm hyperpigmented scar just lateral to this lesion.     Assessment & Plan:

## 2014-05-08 NOTE — Patient Instructions (Signed)
It was a pleasure seeing you again today. I believe that the one area of drainage on your left breast is a re- infection. I will place you on a different antibiotic called Bactrim. You're to take one of these 2 times a day for the next 5 days. If there is no improvement, then please follow-up with in a few weeks so we can evaluate the area.   I will send the drainage we collected today for culture, that take some time to get returned (about 5 days). I will call you once I get the results. The other area on your breast is hyperpigmentation/scar from your prior infection and is not infected.

## 2014-05-08 NOTE — Assessment & Plan Note (Signed)
2 areas on breast, one is hyperpigmentation scar from prior infection. The other appears to be in the same area as one of her breast lesions, that has become reinfected. Area was draining today, I was able to get a wound culture from the area just to be certain we are covering it appropriately. Patient had been on doxycycline prior, she states it never completely went away and she had some odd side effects from the doxycycline. She may have stopped doxycycline sooner than it was supposed to be for therapeutic treatment due to side effects, but she denies this. There is a very small 1 mm size fluctuance, that was easily expressed with puslike drainage. Possibly cystlike wall, that may need to be excised if does not respond to this round of antibiotics. Bactrim twice a day for 5 days called in. Diflucan also called in the event she gets a yeast infection from her antibiotics. I did inform her that this lesion will also scar.  Follow-up in 1-2 weeks if no improvement.

## 2014-05-11 LAB — WOUND CULTURE
GRAM STAIN: NONE SEEN
GRAM STAIN: NONE SEEN
Gram Stain: NONE SEEN
Organism ID, Bacteria: NO GROWTH

## 2014-05-14 ENCOUNTER — Ambulatory Visit: Payer: No Typology Code available for payment source | Admitting: Family Medicine

## 2014-05-15 ENCOUNTER — Ambulatory Visit (INDEPENDENT_AMBULATORY_CARE_PROVIDER_SITE_OTHER): Payer: No Typology Code available for payment source | Admitting: Family Medicine

## 2014-05-15 ENCOUNTER — Encounter: Payer: Self-pay | Admitting: Family Medicine

## 2014-05-15 VITALS — BP 108/70 | HR 88 | Temp 98.5°F | Ht <= 58 in | Wt 198.1 lb

## 2014-05-15 DIAGNOSIS — N649 Disorder of breast, unspecified: Secondary | ICD-10-CM

## 2014-05-15 DIAGNOSIS — L988 Other specified disorders of the skin and subcutaneous tissue: Secondary | ICD-10-CM

## 2014-05-15 NOTE — Progress Notes (Signed)
   Subjective:    Patient ID: Dominique [name], female    DOB: [birth year], 39 y.o.   MRN: [id number]  HPI  Skin abscess: She presents to same-day clinic for follow-up on left breast skin abscess. Patient has completed her antibiotic course, and states that she feels that the bump has completely gone away. She denies any drainage, bleeding or fevers.  Never smoker Past Medical History  Diagnosis Date  . Raynaud's disease   . DVT (deep venous thrombosis)   . Raynaud disease 12/21/2011  . Varicose veins   . GERD (gastroesophageal reflux disease)    Allergies  Allergen Reactions  . Contrast Media [Iodinated Diagnostic Agents] Anaphylaxis and Swelling    Tongue swelling  . Latex Anaphylaxis  . Zofran [Ondansetron Hcl] Other (See Comments)    Hives, numbness of legs with previous use  . Doxycycline     MADE LEGS HURT  . Eggs Or Egg-Derived Products Nausea And Vomiting  . Lactose Intolerance (Gi) Diarrhea  . Nsaids     Worsened gastritis; 02/22/14 GI bleed      Review of Systems Per history of present illness    Objective:   Physical Exam BP 108/70 mmHg  Pulse 88  Temp(Src) 98.5 F (36.9 C) (Oral)  Ht 4\' 7"  (1.397 m)  Wt 198 lb 1.6 oz (89.858 kg)  BMI 46.04 kg/m2 Gen: NAD. Nontoxic., Well-developed, well-nourished, Caucasian female, mildly obese. Skin: Left breast No erythema, no swelling, no drainage, no fluctuance. Scar formation normal.    Assessment & Plan:

## 2014-05-15 NOTE — Patient Instructions (Signed)
It looks great. You are going to have some scar formation, and eventually it'll fade down the look like the other one did.

## 2014-05-15 NOTE — Assessment & Plan Note (Signed)
Fluctuance has completely resolved on follow-up. Patient has completed antibiotic course. Discussed with her and had her palpate the area so she could feel the difference between when there is infection, and when there is not. Discussed normal scar formation, and showed her that what she has now scar, that we'll eventually fade down into likely a hyperpigmented area. Patient voiced understanding, was great for the infection was gone. Follow-up as needed.

## 2014-07-02 ENCOUNTER — Encounter: Payer: Self-pay | Admitting: Family Medicine

## 2014-07-02 ENCOUNTER — Ambulatory Visit (INDEPENDENT_AMBULATORY_CARE_PROVIDER_SITE_OTHER): Payer: No Typology Code available for payment source | Admitting: Family Medicine

## 2014-07-02 VITALS — BP 118/82 | HR 84 | Temp 98.3°F | Ht <= 58 in | Wt 200.0 lb

## 2014-07-02 DIAGNOSIS — R21 Rash and other nonspecific skin eruption: Secondary | ICD-10-CM | POA: Diagnosis not present

## 2014-07-02 DIAGNOSIS — K2970 Gastritis, unspecified, without bleeding: Secondary | ICD-10-CM

## 2014-07-02 DIAGNOSIS — L309 Dermatitis, unspecified: Secondary | ICD-10-CM

## 2014-07-02 MED ORDER — HYDROXYZINE PAMOATE 25 MG PO CAPS: 25.0000 mg | ORAL_CAPSULE | Freq: Four times a day (QID) | ORAL | Status: DC | PRN

## 2014-07-02 MED ORDER — OMEPRAZOLE 40 MG PO CPDR: 40.0000 mg | DELAYED_RELEASE_CAPSULE | Freq: Two times a day (BID) | ORAL | Status: DC

## 2014-07-02 MED ORDER — HYDROCORTISONE 2.5 % EX OINT: TOPICAL_OINTMENT | Freq: Two times a day (BID) | CUTANEOUS | Status: DC

## 2014-07-02 NOTE — Patient Instructions (Signed)
It was great seeing you today.   1. Take Vistaril 25mg  every 6 hours as needed for itching 2. Apply Hydrocortisone twice a day for 7-10 days.  3. Use vaseline as needed for dry, itching skin as well    Please bring all your medications to every doctors visit  Sign up for My Chart to have easy access to your labs results, and communication with your Primary care physician.  Next Appointment  Please call Dr Berkley Harvey in 7-10 to let me know how the rash is doing   I look forward to talking with you again at our next visit. If you have any questions or concerns before then, please call the clinic at [phone].  Take Care,   Dr Phill Myron

## 2014-07-02 NOTE — Assessment & Plan Note (Signed)
Dermatitis of unknown etiology - Will treat with hydrocortisone 2.5% for 7-10 days.  - Advised Vaseline for moisturization after erythema and itching have resolved - She will call to report a rash after completing Steroids. Consider dermatology referral if not improving

## 2014-07-02 NOTE — Progress Notes (Signed)
   Subjective:    Patient ID: Dominique [name], female    DOB: 12-14-1975, 39 y.o.   MRN: [id number]  Seen for Same day visit for   CC: rash  She reports periodic rash underneath the right breast and superior labia major/pubic mons has been occurring for the past several months.  Rashes and itching have been constant with occasional flares associated with mild burning pain.  Denies any vesicles, fevers, chills.  She has not tried any treatments.    Review of Systems   See HPI for ROS. Objective:  BP 118/82 mmHg  Pulse 84  Temp(Src) 98.3 F (36.8 C) (Oral)  Ht 4\' 7"  (1.397 m)  Wt 200 lb (90.719 kg)  BMI 46.48 kg/m2  General: NAD Skin: Approximately 2 cm of erythema under her right breast. Erythema and dry skin noted on superior aspect of labia majora / pubic mons.  No vesicles or ulcers.    Assessment & Plan:  See Problem List Documentation

## 2014-07-03 ENCOUNTER — Other Ambulatory Visit: Payer: Self-pay | Admitting: Family Medicine

## 2014-07-21 ENCOUNTER — Telehealth: Payer: Self-pay | Admitting: Family Medicine

## 2014-07-21 NOTE — Telephone Encounter (Signed)
Contacted pharmacy and was told they did receive Phenergan and they are working on it now. LM for pt and stated that pharmacy was contacted and to wait about an hour for it to be refilled and to call office if still having problems. Fatih Stalvey, CMA.

## 2014-07-21 NOTE — Telephone Encounter (Signed)
Pt called because she still has not been able to get her Phenergan. The pharmacy still says that they have not received . jw

## 2014-07-24 ENCOUNTER — Telehealth: Payer: Self-pay | Admitting: Family Medicine

## 2014-07-24 ENCOUNTER — Emergency Department (INDEPENDENT_AMBULATORY_CARE_PROVIDER_SITE_OTHER)
Admission: EM | Admit: 2014-07-24 | Discharge: 2014-07-24 | Disposition: A | Discharge status: Home / Self Care | Payer: No Typology Code available for payment source | Source: Home / Self Care | Attending: Family Medicine | Admitting: Family Medicine

## 2014-07-24 ENCOUNTER — Encounter (HOSPITAL_COMMUNITY): Payer: Self-pay | Admitting: Emergency Medicine

## 2014-07-24 DIAGNOSIS — T679XXA Effect of heat and light, unspecified, initial encounter: Secondary | ICD-10-CM

## 2014-07-24 DIAGNOSIS — J069 Acute upper respiratory infection, unspecified: Secondary | ICD-10-CM | POA: Diagnosis not present

## 2014-07-24 DIAGNOSIS — E860 Dehydration: Secondary | ICD-10-CM

## 2014-07-24 NOTE — Discharge Instructions (Signed)
Dehydration, Adult Dehydration means your body does not have as much fluid as it needs. Your kidneys, brain, and heart will not work properly without the right amount of fluids and salt.  HOME CARE  Ask your doctor how to replace body fluid losses (rehydrate).  Drink enough fluids to keep your pee (urine) clear or pale yellow.  Drink small amounts of fluids often if you feel sick to your stomach (nauseous) or throw up (vomit).  Eat like you normally do.  Avoid:  Foods or drinks high in sugar.  Bubbly (carbonated) drinks.  Juice.  Very hot or cold fluids.  Drinks with caffeine.  Fatty, greasy foods.  Alcohol.  Tobacco.  Eating too much.  Gelatin desserts.  Wash your hands to avoid spreading germs (bacteria, viruses).  Only take medicine as told by your doctor.  Keep all doctor visits as told. GET HELP RIGHT AWAY IF:   You cannot drink something without throwing up.  You get worse even with treatment.  Your vomit has blood in it or looks greenish.  Your poop (stool) has blood in it or looks black and tarry.  You have not peed in 6 to 8 hours.  You pee a small amount of very dark pee.  You have a fever.  You pass out (faint).  You have belly (abdominal) pain that gets worse or stays in one spot (localizes).  You have a rash, stiff neck, or bad headache.  You get easily annoyed, sleepy, or are hard to wake up.  You feel weak, dizzy, or very thirsty. MAKE SURE YOU:   Understand these instructions.  Will watch your condition.  Will get help right away if you are not doing well or get worse. Document Released: 10/22/2008 Document Revised: 03/20/2011 Document Reviewed: 08/15/2010 [facility] Patient Information 2015 [place], [place]. This information is not intended to replace advice given to you by your health care provider. Make sure you discuss any questions you have with your health care provider.  Rehydration, Adult Rehydration is the replacement of  body fluids lost during dehydration. Dehydration is an extreme loss of body fluids to the point of body function impairment. There are many ways extreme fluid loss can occur, including vomiting, diarrhea, or excess sweating. Recovering from dehydration requires replacing lost fluids, continuing to eat to maintain strength, and avoiding foods and beverages that may contribute to further fluid loss or may increase nausea. HOW TO REHYDRATE In most cases, rehydration involves the replacement of not only fluids but also carbohydrates and basic body salts. Rehydration with an oral rehydration solution is one way to replace essential nutrients lost through dehydration. An oral rehydration solution can be purchased at pharmacies, retail stores, and online. Premixed packets of powder that you combine with water to make a solution are also sold. You can prepare an oral rehydration solution at home by mixing the following ingredients together:    - tsp table salt.   tsp baking soda.   tsp salt substitute containing potassium chloride.  1 tablespoons sugar.  1 L (34 oz) of water. Be sure to use exact measurements. Including too much sugar can make diarrhea worse. Drink -1 cup (120-240 mL) of oral rehydration solution each time you have diarrhea or vomit. If drinking this amount makes your vomiting worse, try drinking smaller amounts more often. For example, drink 1-3 tsp every 5-10 minutes.  A general rule for staying hydrated is to drink 1-2 L of fluid per day. Talk to your caregiver about the  specific amount you should be drinking each day. Drink enough fluids to keep your urine clear or pale yellow. EATING WHEN DEHYDRATED Even if you have had severe sweating or you are having diarrhea, do not stop eating. Many healthy items in a normal diet are okay to continue eating while recovering from dehydration. The following tips can help you to lessen nausea when you eat:  Ask someone else to prepare your  food. Cooking smells may worsen nausea.  Eat in a well-ventilated room away from cooking smells.  Sit up when you eat. Avoid lying down until 1-2 hours after eating.  Eat small amounts when you eat.  Eat foods that are easy to digest. These include soft, well-cooked, or mashed foods. FOODS AND BEVERAGES TO AVOID Avoid eating or drinking the following foods and beverages that may increase nausea or further loss of fluid:   Fruit juices with a high sugar content, such as concentrated juices.  Alcohol.  Beverages containing caffeine.  Carbonated drinks. They may cause a lot of gas.  Foods that may cause a lot of gas, such as cabbage, broccoli, and beans.  Fatty, greasy, and fried foods.  Spicy, very salty, and very sweet foods or drinks.  Foods or drinks that are very hot or very cold. Consume food or drinks at or near room temperature.  Foods that need a lot of chewing, such as raw vegetables.  Foods that are sticky or hard to swallow, such as peanut butter. Document Released: 03/20/2011 Document Revised: 09/20/2011 Document Reviewed: 03/20/2011 [facility] Patient Information 2015 [place], [place]. This information is not intended to replace advice given to you by your health care provider. Make sure you discuss any questions you have with your health care provider.  Near-Syncope Near-syncope (commonly known as near fainting) is sudden weakness, dizziness, or feeling like you might pass out. During an episode of near-syncope, you may also develop pale skin, have tunnel vision, or feel sick to your stomach (nauseous). Near-syncope may occur when getting up after sitting or while standing for a long time. It is caused by a sudden decrease in blood flow to the brain. This decrease can result from various causes or triggers, most of which are not serious. However, because near-syncope can sometimes be a sign of something serious, a medical evaluation is required. The specific cause is often  not determined. HOME CARE INSTRUCTIONS  Monitor your condition for any changes. The following actions may help to alleviate any discomfort you are experiencing:  Have someone stay with you until you feel stable.  Lie down right away and prop your feet up if you start feeling like you might faint. Breathe deeply and steadily. Wait until all the symptoms have passed. Most of these episodes last only a few minutes. You may feel tired for several hours.   Drink enough fluids to keep your urine clear or pale yellow.   If you are taking blood pressure or heart medicine, get up slowly when seated or lying down. Take several minutes to sit and then stand. This can reduce dizziness.  Follow up with your health care provider as directed. SEEK IMMEDIATE MEDICAL CARE IF:   You have a severe headache.   You have unusual pain in the chest, abdomen, or back.   You are bleeding from the mouth or rectum, or you have black or tarry stool.   You have an irregular or very fast heartbeat.   You have repeated fainting or have seizure-like jerking during an episode.  You faint when sitting or lying down.   You have confusion.   You have difficulty walking.   You have severe weakness.   You have vision problems.  MAKE SURE YOU:   Understand these instructions.  Will watch your condition.  Will get help right away if you are not doing well or get worse. Document Released: 12/26/2004 Document Revised: 12/31/2012 Document Reviewed: 05/31/2012 [facility] Patient Information 2015 [place], [place]. This information is not intended to replace advice given to you by your health care provider. Make sure you discuss any questions you have with your health care provider.  Heat-Related Illness Heat-related illnesses occur when the body is unable to properly cool itself. The body normally cools itself by sweating. However, under some conditions sweating is not enough. In these cases, a person's  body temperature rises rapidly. Very high body temperatures may damage the brain or other vital organs. Some examples of heat-related illnesses include:  Heat stroke. This occurs when the body is unable to regulate its temperature. The body's temperature rises rapidly, the sweating mechanism fails, and the body is unable to cool down. Body temperature may rise to 106 F (41 C) or higher within 10 to 15 minutes. Heat stroke can cause death or permanent disability if emergency treatment is not provided.  Heat exhaustion. This is a milder form of heat-related illness that can develop after several days of exposure to high temperatures and not enough fluids. It is the body's response to an excessive loss of the water and salt contained in sweat.  Heat cramps. These usually affect people who sweat a lot during heavy activity. This sweating drains the body's salt and moisture. The low salt level in the muscles causes painful cramps. Heat cramps may also be a symptom of heat exhaustion. Heat cramps usually occur in the abdomen, arms, or legs. Get medical attention for cramps if you have heart problems or are on a low-sodium diet. Those that are at greatest risk for heat-related illnesses include:   The elderly.  Infant and the very young.  People with mental illness and chronic diseases.  People who are overweight (obese).  Young and healthy people can even succumb to heat if they participate in strenuous physical activities during hot weather. CAUSES  Several factors affect the body's ability to cool itself during extremely hot weather. When the humidity is high, sweat will not evaporate as quickly. This prevents the body from releasing heat quickly. Other factors that can affect the body's ability to cool down include:   Age.  Obesity.  Fever.  Dehydration.  Heart disease.  Mental illness.  Poor circulation.  Sunburn.  Prescription drug use.  Alcohol use. SYMPTOMS  Heat stroke:  Warning signs of heat stroke vary, but may include:  An extremely high body temperature (above 103F orally).  A fast, strong pulse.  Dizziness.  Confusion.  Red, hot, and dry skin.  No sweating.  Throbbing headache.  Feeling sick to your stomach (nauseous).  Unconsciousness. Heat exhaustion: Warning signs of heat exhaustion include:  Heavy sweating.  Tiredness.  Headache.  Paleness.  Weakness.  Feeling sick to your stomach (nauseous) or vomiting.  Muscle cramps. Heat cramps  Muscle pains or spasms. TREATMENT  Heat stroke  Get into a cool environment. An indoor place that is air-conditioned may be best.  Take a cool shower or bath. Have someone around to make sure you are okay.  Take your temperature. Make sure it is going down. Heat exhaustion  Drink  plenty of fluids. Do not drink liquids that contain caffeine, alcohol, or large amounts of sugar. These cause you to lose more body fluid. Also, avoid very cold drinks. They can cause stomach cramps.  Get into a cool environment. An indoor place that is air-conditioned may be best.  Take a cool shower or bath. Have someone around to make sure you are okay.  Put on lightweight clothing. Heat cramps  Stop whatever activity you were doing. Do not attempt to do that activity for at least 3 hours after the cramps have gone away.  Get into a cool environment. An indoor place that is air-conditioned may be best. [place]  To protect your health when temperatures are extremely high, follow these tips:  During heavy exercise in a hot environment, drink two to four glasses (16-32 ounces) of cool fluids each hour. Do not wait until you are thirsty to drink. Warning: If your caregiver limits the amount of fluid you drink or has you on water pills, ask how much you should drink while the weather is hot.  Do not drink liquids that contain caffeine, alcohol, or large amounts of sugar. These cause you to  lose more body fluid.  Avoid very cold drinks. They can cause stomach cramps.  Wear appropriate clothing. Choose lightweight, light-colored, loose-fitting clothing.  If you must be outdoors, try to limit your outdoor activity to morning and evening hours. Try to rest often in shady areas.  If you are not used to working or exercising in a hot environment, start slowly and pick up the pace gradually.  Stay cool in an air-conditioned place if possible. If your home does not have air conditioning, go to the shopping mall or [employer].  Taking a cool shower or bath may help you cool off. SEEK MEDICAL CARE IF:   You see any of the symptoms listed above. You may be dealing with a life-threatening emergency.  Symptoms worsen or last longer than 1 hour.  Heat cramps do not get better in 1 hour. MAKE SURE YOU:   Understand these instructions.  Will watch your condition.  Will get help right away if you are not doing well or get worse. Document Released: 10/05/2007 Document Revised: 03/20/2011 Document Reviewed: 10/05/2007 [facility] Patient Information 2015 [place], [place]. This information is not intended to replace advice given to you by your health care provider. Make sure you discuss any questions you have with your health care provider.  Viral Infections A virus is a type of germ. Viruses can cause:  Minor sore throats.  Aches and pains.  Headaches.  Runny nose.  Rashes.  Watery eyes.  Tiredness.  Coughs.  Loss of appetite.  Feeling sick to your stomach (nausea).  Throwing up (vomiting).  Watery poop (diarrhea). HOME CARE   Only take medicines as told by your doctor.  Drink enough water and fluids to keep your pee (urine) clear or pale yellow. Sports drinks are a good choice.  Get plenty of rest and eat healthy. Soups and broths with crackers or rice are fine. GET HELP RIGHT AWAY IF:   You have a very bad headache.  You have shortness of  breath.  You have chest pain or neck pain.  You have an unusual rash.  You cannot stop throwing up.  You have watery poop that does not stop.  You cannot keep fluids down.  You or your child has a temperature by mouth above 102 F (38.9 C), not controlled by  medicine.  Your baby is older than 3 months with a rectal temperature of 102 F (38.9 C) or higher.  Your baby is 42 months old or younger with a rectal temperature of 100.4 F (38 C) or higher. MAKE SURE YOU:   Understand these instructions.  Will watch this condition.  Will get help right away if you are not doing well or get worse. Document Released: 12/09/2007 Document Revised: 03/20/2011 Document Reviewed: 05/03/2010 [facility] Patient Information 2015 [place], [place]. This information is not intended to replace advice given to you by your health care provider. Make sure you discuss any questions you have with your health care provider.

## 2014-07-24 NOTE — ED Provider Notes (Signed)
CSN: [id number]     Arrival date & time 07/24/14  1747 History   First MD Initiated Contact with Patient 07/24/14 1851     Chief Complaint  Patient presents with  . URI   (Consider location/radiation/quality/duration/timing/severity/associated sxs/prior Treatment) HPI Comments: 39 year old female has been complaining of weakness, nasal congestion, rhinorrhea, sniffles and weakness with occasional feeling of being deemed syncope. She has been working in warm environment and has not been taking in fluids as she should be. Denies having fever, abdominal pain, nausea vomiting or diarrhea. Denies earache, sore throat.   Past Medical History  Diagnosis Date  . Raynaud's disease   . DVT (deep venous thrombosis)   . Raynaud disease 12/21/2011  . Varicose veins   . GERD (gastroesophageal reflux disease)    Past Surgical History  Procedure Laterality Date  . Abdominal hysterectomy    . Cesarean section    . Appendectomy    . Hernia repair  2006  . Endovenous ablation saphenous vein w/ laser  02-01-2012    right greater saphenous vein and stab phlebectomy 10-20 incisions right leg  by Curt Jews, MD  . Endovenous ablation saphenous vein w/ laser Left 02-29-2012    left greater saphenous vein and stab phlebectomy left leg 10-20 incisions  by Curt Jews MD  . Esophagogastroduodenoscopy (egd) with propofol N/A 03/26/2012    Procedure: ESOPHAGOGASTRODUODENOSCOPY (EGD) WITH PROPOFOL;  Surgeon: Cleotis Nipper, MD;  Location: [facility];  Service: Endoscopy;  Laterality: N/A;  . Colonoscopy with propofol N/A 03/26/2012    Procedure: COLONOSCOPY WITH PROPOFOL;  Surgeon: Cleotis Nipper, MD;  Location: [facility];  Service: Endoscopy;  Laterality: N/A;  . Endovenous ablation saphenous vein w/ laser Left   . Esophagogastroduodenoscopy (egd) with propofol N/A 04/02/2014    Procedure: ESOPHAGOGASTRODUODENOSCOPY (EGD) WITH PROPOFOL;  Surgeon: Ronald Lobo, MD;  Location: [facility];  Service:  Endoscopy;  Laterality: N/A;  . Balloon dilation N/A 04/02/2014    Procedure: BALLOON DILATION;  Surgeon: Ronald Lobo, MD;  Location: [facility];  Service: Endoscopy;  Laterality: N/A;   Family History  Problem Relation Age of Onset  . Diabetes Mother   . Heart disease Mother   . Hyperlipidemia Mother   . Hypertension Mother   . Other Mother     amputation, varicose veins  . Diabetes Father   . Hyperlipidemia Father   . Hypertension Father   . Peripheral vascular disease Father   . Other Father     varicose veins  . Deep vein thrombosis Brother   . Other Brother     varicose veins   History  Substance Use Topics  . Smoking status: Never Smoker   . Smokeless tobacco: Never Used  . Alcohol Use: No   OB History    No data available     Review of Systems  Constitutional: Positive for activity change. Negative for fever, chills, appetite change and fatigue.  HENT: Positive for congestion, postnasal drip and rhinorrhea. Negative for facial swelling and sore throat.   Eyes: Negative.   Respiratory: Negative.  Negative for cough, shortness of breath and wheezing.   Cardiovascular: Negative.   Gastrointestinal: Negative.   Musculoskeletal: Negative for neck pain and neck stiffness.  Skin: Negative for pallor and rash.  Neurological: Negative.     Allergies  Contrast media; Latex; Zofran; Doxycycline; Eggs or egg-derived products; Lactose intolerance (gi); and Nsaids  Home Medications   Prior to Admission medications   Medication Sig Start Date End Date Taking?  Authorizing Provider  acetaminophen (TYLENOL) 325 MG tablet Take 650 mg by mouth every 6 (six) hours as needed for moderate pain.   Yes Historical Provider, MD  meloxicam (MOBIC) 15 MG tablet Take 1 tablet (15 mg total) by mouth daily. 04/10/14  Yes Olam Idler, MD  Multiple Vitamin (MULTIVITAMIN WITH MINERALS) TABS Take 1 tablet by mouth daily.   Yes Historical Provider, MD  valACYclovir (VALTREX) 1000 MG  tablet Take 0.5 tablets (500 mg total) by mouth 2 (two) times daily. 02/07/14  Yes Harden Mo, MD  amitriptyline (ELAVIL) 10 MG tablet Take 2.5 tablets (25 mg total) by mouth at bedtime. 04/10/14   Olam Idler, MD  Ferrous Sulfate (IRON) 325 (65 FE) MG TABS Take 325 mg by mouth daily. 03/09/14   Olam Idler, MD  fluconazole (DIFLUCAN) 150 MG tablet Take 1 tablet (150 mg total) by mouth once. 05/08/14   Renee A Kuneff, DO  hydrocortisone 2.5 % ointment Apply topically 2 (two) times daily. 07/02/14   Olam Idler, MD  hydrOXYzine (VISTARIL) 25 MG capsule Take 1 capsule (25 mg total) by mouth every 6 (six) hours as needed. 07/02/14   Olam Idler, MD  hyoscyamine (LEVSIN SL) 0.125 MG SL tablet Take 0.125 mg by mouth every 6 (six) hours as needed for cramping.  02/19/14   Historical Provider, MD  mupirocin ointment (BACTROBAN) 2 % Apply to both nostrils TID for 1 month. 02/07/14   Harden Mo, MD  omeprazole (PRILOSEC) 40 MG capsule Take 1 capsule (40 mg total) by mouth 2 (two) times daily before a meal. 07/02/14   Olam Idler, MD  promethazine (PHENERGAN) 25 MG tablet TAKE ONE TABLET BY MOUTH EVERY 6 HOURS AS NEEDED FOR NAUSEA 07/07/14   Olam Idler, MD  sulfamethoxazole-trimethoprim (BACTRIM DS,SEPTRA DS) 800-160 MG per tablet Take 1 tablet by mouth 2 (two) times daily. 05/08/14   Renee A Kuneff, DO   BP 112/76 mmHg  Pulse 86  Temp(Src) 98.1 F (36.7 C) (Oral)  Resp 18  SpO2 99% Physical Exam  Constitutional: She is oriented to person, place, and time. She appears well-developed and well-nourished. No distress.  HENT:   Bilateral TMs are normal  oropharynx with minor erythema andScant clear PND. No exudates.  Eyes: Conjunctivae and EOM are normal.  Neck: Normal range of motion. Neck supple.  Cardiovascular: Normal rate, regular rhythm and normal heart sounds.   Pulmonary/Chest: Effort normal and breath sounds normal. No respiratory distress. She has no wheezes. She has no rales.   Abdominal: Soft. There is no tenderness.  Musculoskeletal: Normal range of motion. She exhibits no edema.  Lymphadenopathy:    She has no cervical adenopathy.  Neurological: She is alert and oriented to person, place, and time.  Skin: Skin is warm and dry. No rash noted.  Psychiatric: She has a normal mood and affect.  Nursing note and vitals reviewed.   ED Course  Procedures (including critical care time) Labs Review Labs Reviewed - No data to display  Imaging Review No results found.   MDM   1. Heat effect, initial encounter   2. URI (upper respiratory infection)   3. Dehydration    Stay home in cool environment this week. Stay well-hydrated. May want to add Gatorade  while working next week. Continue your Zyrtec. May want to add nasal Flonase spray. Suspect that she has been working in a hot environment and that this is causing some weakness. She is not  consuming sufficient amount of liquids to recuperate losses. She also has either a URI versus allergy type symptoms.     Janne Napoleon, NP 07/24/14 [phone]

## 2014-07-24 NOTE — ED Notes (Signed)
C/o cold sx onset 1 week Sx include nasal congestion, fevers, chills, weakness Alert, no signs of acute distress.

## 2014-07-24 NOTE — Telephone Encounter (Signed)
LM for patient informing her that she needs to have an appt before abx can be called in for her.  Please assist her in doing this when she calls back.  Thanks [employer]

## 2014-07-24 NOTE — Telephone Encounter (Signed)
Pt called because she is not feeling well. She was hoping that the doctor could call in some antibiotics for her. jw

## 2014-07-29 ENCOUNTER — Encounter: Payer: Self-pay | Admitting: Family Medicine

## 2014-07-29 ENCOUNTER — Ambulatory Visit (INDEPENDENT_AMBULATORY_CARE_PROVIDER_SITE_OTHER): Payer: No Typology Code available for payment source | Admitting: Family Medicine

## 2014-07-29 VITALS — BP 111/61 | HR 100 | Temp 98.3°F | Ht <= 58 in | Wt 199.6 lb

## 2014-07-29 DIAGNOSIS — N390 Urinary tract infection, site not specified: Secondary | ICD-10-CM

## 2014-07-29 DIAGNOSIS — R399 Unspecified symptoms and signs involving the genitourinary system: Secondary | ICD-10-CM

## 2014-07-29 LAB — POCT URINALYSIS DIPSTICK
Bilirubin, UA: NEGATIVE
GLUCOSE UA: NEGATIVE
KETONES UA: NEGATIVE
LEUKOCYTES UA: NEGATIVE
Nitrite, UA: POSITIVE
PH UA: 6.5
Protein, UA: NEGATIVE
Spec Grav, UA: 1.015
Urobilinogen, UA: 0.2

## 2014-07-29 LAB — POCT UA - MICROSCOPIC ONLY

## 2014-07-29 MED ORDER — CEPHALEXIN 500 MG PO CAPS: 500.0000 mg | ORAL_CAPSULE | Freq: Four times a day (QID) | ORAL | Status: DC

## 2014-07-29 NOTE — Assessment & Plan Note (Signed)
+  nitrites on UA. Will treat with keflex x 5 days. F/u if not improved after antibiotics.

## 2014-07-29 NOTE — Progress Notes (Signed)
   Subjective:    Patient ID: Dominique [name], female    DOB: 15-Aug-1975, 39 y.o.   MRN: [id number]  HPI  Patient presents for Same Day Appointment  CC: burning with urination  # UTI sx:  Present for 2-3 weeks  Burning with each void  Urgency + frequency  Hasn't had UTI in several years  Has been taking azo x 4-5 days ROS: no fevers, no chills, +nausea (relieved with phenergan), no vaginal discharge  Review of Systems   See HPI for ROS. All other systems reviewed and are negative.  Past medical history, surgical, family, and social history reviewed and updated in the EMR as appropriate.  Objective:  BP 111/61 mmHg  Pulse 100  Temp(Src) 98.3 F (36.8 C) (Oral)  Ht 4\' 7"  (1.397 m)  Wt 199 lb 9 oz (90.521 kg)  BMI 46.38 kg/m2 Vitals and nursing note reviewed  General: NAD CV: borderline tachy, reg rhythm, no murmurs Resp: CTAB Abdomen: obese, soft, nontender, nondistended, normal bowel sounds Back: no CVA tenderness  Assessment & Plan:  See Problem List Documentation

## 2014-08-06 ENCOUNTER — Ambulatory Visit (INDEPENDENT_AMBULATORY_CARE_PROVIDER_SITE_OTHER): Payer: No Typology Code available for payment source | Admitting: Family Medicine

## 2014-08-06 ENCOUNTER — Encounter: Payer: Self-pay | Admitting: Family Medicine

## 2014-08-06 VITALS — BP 101/67 | HR 98 | Temp 98.2°F | Ht <= 58 in | Wt 199.6 lb

## 2014-08-06 DIAGNOSIS — R300 Dysuria: Secondary | ICD-10-CM

## 2014-08-06 DIAGNOSIS — N3020 Other chronic cystitis without hematuria: Secondary | ICD-10-CM | POA: Diagnosis not present

## 2014-08-06 LAB — POCT URINALYSIS DIPSTICK
Bilirubin, UA: NEGATIVE
Blood, UA: NEGATIVE
Glucose, UA: NEGATIVE
Ketones, UA: NEGATIVE
Leukocytes, UA: NEGATIVE
Nitrite, UA: POSITIVE
Spec Grav, UA: >=1.03
Urobilinogen, UA: 1
pH, UA: 6

## 2014-08-06 MED ORDER — SULFAMETHOXAZOLE-TRIMETHOPRIM 800-160 MG PO TABS: 1.0000 | ORAL_TABLET | Freq: Two times a day (BID) | ORAL | Status: DC

## 2014-08-06 MED ORDER — PHENAZOPYRIDINE HCL 200 MG PO TABS
200.0000 mg | ORAL_TABLET | Freq: Three times a day (TID) | ORAL | Status: DC | PRN
Start: 2014-08-06 — End: 2014-11-12

## 2014-08-06 NOTE — Progress Notes (Signed)
   Subjective:    Patient ID: Dominique [name], female    DOB: 1975-03-24, 39 y.o.   MRN: [id number]  Seen for Same day visit for   CC: dysuria   She was recently dx and given keflex with minimal improvement. She hasn't been able to f/u with urology. She reports having problems with her bladder since her first child. She reports 8 years clean pain pills. She hasn't been to the urologist in 10 years.   Pain urinating started 3 weeks ago  Pain is: 8/10, burns  Medications tried: azo, keflex  Any antibiotics in the last 30 days: yes  More than 3 UTIs in the last 12 months: 1  STD exposure: no partner in 8 years  Possibly pregnant: no  Symptoms Urgency: yes Frequency: no Blood in urine: no Pain in back: no Fever: subjective  Vaginal discharge: no Mouth Ulcers: no  PMH - Smoking status noted.    Review of Systems   See HPI for ROS. Objective:  BP 101/67 mmHg  Pulse 98  Temp(Src) 98.2 F (36.8 C) (Oral)  Ht 4\' 7"  (1.397 m)  Wt 199 lb 9.6 oz (90.538 kg)  BMI 46.39 kg/m2  General: NAD Cardiac: RRR, normal heart sounds, no murmurs Respiratory: CTAB, normal effort Abdomen: soft, nondistended, no hepatic or splenomegaly. Bowel sounds present, mild suprapubic tenderness.  MSK: no CVA tenderness  Extremities: no edema or cyanosis. WWP. Skin: warm and dry, no rashes noted      Assessment & Plan:  See Problem List Documentation

## 2014-08-06 NOTE — Patient Instructions (Signed)
Thank you for coming in,   Will call you with the results of the urine culture.  If you get worse in terms of having back pain or fever, please give [place] a call.  Please follow-up with Dr. Berkley Harvey for all ongoing issues.  Please bring all of your medications with you to each visit.    Please feel free to call with any questions or concerns at any time, at [phone]. --Dr. Raeford Razor

## 2014-08-07 LAB — URINE CULTURE
Colony Count: NO GROWTH
Organism ID, Bacteria: NO GROWTH

## 2014-08-09 DIAGNOSIS — N390 Urinary tract infection, site not specified: Secondary | ICD-10-CM | POA: Insufficient documentation

## 2014-08-09 DIAGNOSIS — R300 Dysuria: Secondary | ICD-10-CM | POA: Diagnosis present

## 2014-08-09 NOTE — Assessment & Plan Note (Signed)
Still having pain with urination and unable to f/u with urologist  She recently had treatment with keflex but with only mild improvement.  UA showing nitrites.  - Bactrim and pyridium today  - urine cx pending  - expressed the need to f/u with PCP and consideration of referral to urology.

## 2014-08-10 ENCOUNTER — Emergency Department (HOSPITAL_COMMUNITY)
Admission: EM | Admit: 2014-08-10 | Discharge: 2014-08-10 | Discharge status: Against Medical Advice | Payer: No Typology Code available for payment source | Attending: Emergency Medicine | Admitting: Emergency Medicine

## 2014-08-10 ENCOUNTER — Encounter (HOSPITAL_COMMUNITY): Payer: Self-pay | Admitting: Emergency Medicine

## 2014-08-10 DIAGNOSIS — R300 Dysuria: Secondary | ICD-10-CM

## 2014-08-10 HISTORY — DX: Personal history of urinary (tract) infections: Z87.440

## 2014-08-10 LAB — CBC WITH DIFFERENTIAL/PLATELET
BASOS ABS: 0 10*3/uL (ref 0.0–0.1)
Basophils Relative: 0 % (ref 0–1)
Eosinophils Absolute: 0.5 10*3/uL (ref 0.0–0.7)
Eosinophils Relative: 6 % — ABNORMAL HIGH (ref 0–5)
HEMATOCRIT: 34.4 % — AB (ref 36.0–46.0)
Hemoglobin: 11.2 g/dL — ABNORMAL LOW (ref 12.0–15.0)
LYMPHS ABS: 2.9 10*3/uL (ref 0.7–4.0)
Lymphocytes Relative: 34 % (ref 12–46)
MCH: 28.9 pg (ref 26.0–34.0)
MCHC: 32.6 g/dL (ref 30.0–36.0)
MCV: 88.7 fL (ref 78.0–100.0)
MONOS PCT: 7 % (ref 3–12)
Monocytes Absolute: 0.6 10*3/uL (ref 0.1–1.0)
NEUTROS ABS: 4.6 10*3/uL (ref 1.7–7.7)
NEUTROS PCT: 53 % (ref 43–77)
Platelets: 418 10*3/uL — ABNORMAL HIGH (ref 150–400)
RBC: 3.88 MIL/uL (ref 3.87–5.11)
RDW: 15.1 % (ref 11.5–15.5)
WBC: 8.7 10*3/uL (ref 4.0–10.5)

## 2014-08-10 LAB — URINALYSIS, ROUTINE W REFLEX MICROSCOPIC
Bilirubin Urine: NEGATIVE
GLUCOSE, UA: NEGATIVE mg/dL
Hgb urine dipstick: NEGATIVE
KETONES UR: NEGATIVE mg/dL
Leukocytes, UA: NEGATIVE
NITRITE: NEGATIVE
PH: 6 (ref 5.0–8.0)
Protein, ur: NEGATIVE mg/dL
Specific Gravity, Urine: 1.018 (ref 1.005–1.030)
UROBILINOGEN UA: 0.2 mg/dL (ref 0.0–1.0)

## 2014-08-10 LAB — BASIC METABOLIC PANEL
ANION GAP: 11 (ref 5–15)
BUN: 10 mg/dL (ref 6–20)
CALCIUM: 8.7 mg/dL — AB (ref 8.9–10.3)
CO2: 22 mmol/L (ref 22–32)
Chloride: 106 mmol/L (ref 101–111)
Creatinine, Ser: 0.78 mg/dL (ref 0.44–1.00)
GFR calc non Af Amer: >60 mL/min (ref >60)
Glucose, Bld: 112 mg/dL — ABNORMAL HIGH (ref 65–99)
Potassium: 3.7 mmol/L (ref 3.5–5.1)
SODIUM: 139 mmol/L (ref 135–145)

## 2014-08-10 NOTE — ED Notes (Signed)
Pt states that she is tired of waiting to see the doctor. Pt states that she is hurting, and that she would rather hurt at home. This RN explained to the pt that she could have a worsening of her condition if she leaves at this time. Pt verbally acknowledges this.

## 2014-08-10 NOTE — ED Notes (Signed)
Pt called out, stating that she wants her papers so she can go home.

## 2014-08-10 NOTE — ED Provider Notes (Signed)
This chart was scribed for Jola Schmidt, MD by Altamease Oiler, ED Scribe.   2:19 AM Pt left before being seen. No history. No physical was performed.   Jola Schmidt, MD 08/10/14 [phone]

## 2014-08-10 NOTE — ED Notes (Signed)
Pt. reports recurrent UTI with dysuria , malodorous urine and urinary frequency onset this week , denies fever or chills. Last UTI 2 weeks ago and completed prescription oral antibiotic .

## 2014-08-14 ENCOUNTER — Ambulatory Visit (INDEPENDENT_AMBULATORY_CARE_PROVIDER_SITE_OTHER): Payer: No Typology Code available for payment source | Admitting: Family Medicine

## 2014-08-14 ENCOUNTER — Encounter: Payer: Self-pay | Admitting: Family Medicine

## 2014-08-14 VITALS — BP 114/64 | HR 102 | Temp 98.4°F | Ht <= 58 in | Wt 203.2 lb

## 2014-08-14 DIAGNOSIS — M79604 Pain in right leg: Secondary | ICD-10-CM

## 2014-08-14 DIAGNOSIS — N3941 Urge incontinence: Secondary | ICD-10-CM | POA: Diagnosis not present

## 2014-08-14 DIAGNOSIS — M79651 Pain in right thigh: Secondary | ICD-10-CM

## 2014-08-14 DIAGNOSIS — R319 Hematuria, unspecified: Secondary | ICD-10-CM | POA: Insufficient documentation

## 2014-08-14 DIAGNOSIS — R300 Dysuria: Secondary | ICD-10-CM | POA: Diagnosis not present

## 2014-08-14 DIAGNOSIS — R32 Unspecified urinary incontinence: Secondary | ICD-10-CM | POA: Insufficient documentation

## 2014-08-14 DIAGNOSIS — N390 Urinary tract infection, site not specified: Secondary | ICD-10-CM | POA: Diagnosis not present

## 2014-08-14 LAB — POCT URINALYSIS DIPSTICK
Bilirubin, UA: NEGATIVE
GLUCOSE UA: NEGATIVE
Leukocytes, UA: NEGATIVE
Nitrite, UA: NEGATIVE
PROTEIN UA: NEGATIVE
UROBILINOGEN UA: 0.2
pH, UA: 6

## 2014-08-14 MED ORDER — KETOROLAC TROMETHAMINE 30 MG/ML IJ SOLN
30.0000 mg | Freq: Once | INTRAMUSCULAR | Status: AC
  Administered 2014-08-14: 30 mg via INTRAMUSCULAR

## 2014-08-14 MED ORDER — KETOROLAC TROMETHAMINE 30 MG/ML IJ SOLN: 30.0000 mg | Freq: Once | INTRAMUSCULAR | Status: DC

## 2014-08-14 MED ORDER — TRAMADOL HCL 50 MG PO TABS: 50.0000 mg | ORAL_TABLET | Freq: Three times a day (TID) | ORAL | Status: DC | PRN

## 2014-08-14 MED ORDER — PROMETHAZINE HCL 25 MG PO TABS: 12.5000 mg | ORAL_TABLET | Freq: Four times a day (QID) | ORAL | Status: DC | PRN

## 2014-08-14 NOTE — Progress Notes (Signed)
  Patient name: Dominique [name] MRN [id number]  Date of birth: [birth year]  CC & HPI:  Dominique [name] is a 39 y.o. female presenting today for dysuria and right leg pain.   Dysuria - constant burning in lower abdomen for past few weeks - urge incontinence and frequency  - denies fevers, chills - denies pain with urination - completed Abx without imrovement - no history or kidney / bladder stones  Right leg pain - pain in thigh that comes a goes in different spots - hx of blood clots; but this feels different  - no worsening swelling or CP, SOB - no trauma - thinks related to being more active with moving daughter into college - denies previous trauma / surgery  ROS: See HPI   Medical & Surgical Hx:  Reviewed  Medications & Allergies: Reviewed  Social History: Reviewed:   Objective Findings:  Vitals: BP 114/64 mmHg  Pulse 102  Temp(Src) 98.4 F (36.9 C) (Oral)  Ht 4\' 7"  (1.397 m)  Wt 203 lb 3 oz (92.165 kg)  BMI 47.23 kg/m2  Gen: NAD CV: RRR w/o m/r/g, pulses +2 b/l Resp: CTAB w/ normal respiratory effort GI: No skin changes; BS + x 4 quads; Mild suprapubic tenderness; no masses, No CVA tenderness Right leg: 1+ edema equal to left leg; no calf tenderness; negative homan's; Gross strength and sensation intact   Assessment & Plan:   Please See Problem Focused Assessment & Plan

## 2014-08-14 NOTE — Assessment & Plan Note (Signed)
Intermittent pain that occurs in different spots on right thigh likely MSK etiology due to being more active vs related to fibromyalgia - discussed PT referral, she is considering - Ultram 50mg  # 15 given - She declined stronger medications due to her hx of Drug abuse - advised heating pad and gentle strengthing - f/u in 1 week if not improving and will consider imaging

## 2014-08-14 NOTE — Patient Instructions (Addendum)
It was great seeing you today.   1. I have referred you to urology for evaluation of the blood in your urine and bladder pain.  2. Your leg pain is likely due to muscle strain. Use heating pad and gentle stretching for your leg pain.  3. Call or return to clinic if you develop chest pain, shortness of breath or worsening right leg pain.  4. Let me know if you would like referral to physical therapy   Please bring all your medications to every doctors visit  Sign up for My Chart to have easy access to your labs results, and communication with your Primary care physician.  Next Appointment  Please make an appointment with Dr Berkley Harvey in 1 month or sooner if needed for leg pain   I look forward to talking with you again at our next visit. If you have any questions or concerns before then, please call the clinic at [phone].  Take Care,   Dr Phill Myron

## 2014-08-14 NOTE — Assessment & Plan Note (Signed)
Doubt infection given neg Leuks and Nits. Pain possibly related to fibromyalgia but referred to urology for evaluation of hematuria

## 2014-08-17 ENCOUNTER — Other Ambulatory Visit: Payer: Self-pay | Admitting: *Deleted

## 2014-08-17 DIAGNOSIS — M797 Fibromyalgia: Secondary | ICD-10-CM

## 2014-08-17 MED ORDER — MELOXICAM 15 MG PO TABS: 15.0000 mg | ORAL_TABLET | Freq: Every day | ORAL | Status: DC

## 2014-08-19 ENCOUNTER — Other Ambulatory Visit: Payer: Self-pay | Admitting: Family Medicine

## 2014-08-19 DIAGNOSIS — M79604 Pain in right leg: Secondary | ICD-10-CM

## 2014-08-19 NOTE — Telephone Encounter (Signed)
Pt called and would like a refill on her Tramadol. Please let patient know when it is ready. jw

## 2014-08-20 NOTE — Telephone Encounter (Signed)
Pt is taking daughter to [place] tomorrow. She would like the tramadol before she goes The leg pain hasnt gone away Please advise

## 2014-08-20 NOTE — Telephone Encounter (Signed)
Refill request from pt. Will forward to PCP for review. Almeda Ezra, CMA. 

## 2014-08-21 MED ORDER — TRAMADOL HCL 50 MG PO TABS: 50.0000 mg | ORAL_TABLET | Freq: Three times a day (TID) | ORAL | Status: DC | PRN

## 2014-08-21 NOTE — Telephone Encounter (Signed)
Called and left voicemail advising her to schedule appointment for reevaluation of her right leg pain.  Also let her know that I refilled her Ultram # 20.

## 2014-08-28 ENCOUNTER — Ambulatory Visit (INDEPENDENT_AMBULATORY_CARE_PROVIDER_SITE_OTHER): Payer: No Typology Code available for payment source | Admitting: Family Medicine

## 2014-08-28 ENCOUNTER — Encounter: Payer: Self-pay | Admitting: Family Medicine

## 2014-08-28 VITALS — BP 103/80 | HR 108 | Temp 98.4°F | Ht <= 58 in | Wt 202.1 lb

## 2014-08-28 DIAGNOSIS — M79651 Pain in right thigh: Secondary | ICD-10-CM

## 2014-08-28 DIAGNOSIS — M79604 Pain in right leg: Secondary | ICD-10-CM | POA: Diagnosis not present

## 2014-08-28 LAB — D-DIMER, QUANTITATIVE (NOT AT ARMC)

## 2014-08-28 MED ORDER — TRAMADOL HCL 50 MG PO TABS: 50.0000 mg | ORAL_TABLET | Freq: Three times a day (TID) | ORAL | Status: DC | PRN

## 2014-08-28 NOTE — Patient Instructions (Signed)
It was great seeing you today.   1. I have ordered some x-rays and blood work to evaluate her right thigh pain.  Call the clinic if you develop any new or worrisome symptoms.  Go to the ED if you develop inability to walk, chest pain, shortness of breath.  2. I have also ordered MRI of your right hip and thigh.  3. Continue to use Tylenol 650 mg every 6 hours as needed for pain.  Use Ultram 50 mg every 6 hours as needed for breakthrough pain.    Please bring all your medications to every doctors visit  Sign up for My Chart to have easy access to your labs results, and communication with your Primary care physician.  Next Appointment  Please make an appointment with Dr Berkley Harvey in 1-2 weeks   I look forward to talking with you again at our next visit. If you have any questions or concerns before then, please call the clinic at [phone].  Take Care,   Dr Phill Myron

## 2014-08-28 NOTE — Progress Notes (Signed)
  Patient name: Nona Gracey MRN [id number]  Date of birth: 1975/12/07  CC & HPI:  Cici Rodriges is a 39 y.o. female presenting today for right leg.  Right leg pain started and right knee about one month ago.  Denies any trauma, twisting or acute onset of pain.  Pain initially 5 out of 10 that improved with ibuprofen.  Since then, pain now in right upper anterior thigh radiating to lateral hip.  Pain is constant but worse with activity.  Pain is described as achy, without numbness or shooting pains.  No lower back pain.  Denies previous back, hip or knee trauma or surgeries.  History of right popliteal DVT approximately 15 years ago.  History of right great saphenous vein removal, and chronic venous insufficiency.  History of opiate pill addiction, but denies alcohol or IV drug use.  History of fibromyalgia with bilateral chronic leg pain that is different in nature.  Admits to some right leg weakness with extreme pain but denies locking, catching or giving way.   ROS: See HPI   Medical & Surgical Hx:  Reviewed  Medications & Allergies: Reviewed  Social History: Reviewed:   Objective Findings:  Vitals: BP 103/80 mmHg  Pulse 108  Temp(Src) 98.4 F (36.9 C) (Oral)  Ht 4\' 7"  (1.397 m)  Wt 202 lb 1 oz (91.655 kg)  BMI 46.96 kg/m2  Gen: NAD CV: Mild tachycardia w/o m/r/g, pulses +2 b/l Resp: CTAB w/ normal respiratory effort Right Hip/Thigh: Inspection: No obvious rash, bruising, swelling ROM: Full Strength IR: 5/5, ER: 5/5, Flexion: 5/5, Extension: 5/5, Abduction: 5/5, Adduction: 5/5 Pelvic alignment unremarkable to inspection and palpation. Greater trochanter without tenderness to palpation. No tenderness over piriformis. No pain with FABER or FADIR. No SI joint tenderness  No tenderness to thigh, hamstring palpation Right Knee: Inspection: No erythema; No Bruising Palpation:  No warmth; No effusion; Patellar glide without crepitus; No Tenderness; ROM: Full flexion, extension  and lower leg rotation Ligaments: Solid with consistent endpoints including ACL, PCL, LCL, MCL  Negative Mcmurray's Non painful patellar compression Patellar Apprehension Negative Patellar and quadriceps tendons unremarkable. Hamstring and quadriceps strength is normal.   Assessment & Plan:   Please See Problem Focused Assessment & Plan

## 2014-08-28 NOTE — Assessment & Plan Note (Signed)
Pertinent S&O  Right anterior thigh pain radiating to lateral hip; achy in nature  No LBP; sciatica  No trauma  Hx Right leg DVT ~ 15 years ago  Hx of Right great saphenous vein removal Assessment  Pain most consistent with MSK etiology; However, vascular history concerning.  No current signs of neurovascular compromise, clot or infection. Also suspect low pain threshold.  Has fibromyalgia with chronic bilateral leg pain of different nature.  History of Opioid pill abuse Plan  Check d-dimer; will follow with vascular Dopplers if positive.  Advised to go to ED for signs of DVT or PE (see AVS)  DG right hip, femur - to assess for bone tumor, arthritis or stress fracture  MRI right hip and femur ordered  Ultram 50 mg # 30 given; advised using Tylenol 650 first.  Avoid NSAIDs due to history of GI bleed and esophageal strictures

## 2014-09-07 ENCOUNTER — Ambulatory Visit (INDEPENDENT_AMBULATORY_CARE_PROVIDER_SITE_OTHER): Payer: No Typology Code available for payment source | Admitting: Family Medicine

## 2014-09-07 VITALS — BP 122/70 | HR 62 | Ht <= 58 in | Wt 201.0 lb

## 2014-09-07 DIAGNOSIS — M79604 Pain in right leg: Secondary | ICD-10-CM

## 2014-09-07 DIAGNOSIS — M79651 Pain in right thigh: Secondary | ICD-10-CM

## 2014-09-07 MED ORDER — VALACYCLOVIR HCL 1 G PO TABS: 500.0000 mg | ORAL_TABLET | Freq: Two times a day (BID) | ORAL | Status: DC

## 2014-09-07 MED ORDER — TRAMADOL HCL 50 MG PO TABS: 50.0000 mg | ORAL_TABLET | Freq: Three times a day (TID) | ORAL | Status: DC | PRN

## 2014-09-07 NOTE — Assessment & Plan Note (Signed)
Right thigh pain persists without improvement.  She was unable to get x-rays, but is scheduled for MRI this Friday.  She takes 3-4 Ultram daily.  Continues to have no neurovascular symptoms, and d-dimer was negative.  - Continue PRICE therapy and Ultram - refilled # 30

## 2014-09-07 NOTE — Patient Instructions (Signed)
It was great seeing you today.   1. I still think your pain is musculoskeletal related and the MRI will give [place] more information about this.  Continue to take your Ultram as needed until we get more information.  2. Call the clinic if you develop any fevers, chills, redness of your hip or swelling in your right leg   Please bring all your medications to every doctors visit  Sign up for My Chart to have easy access to your labs results, and communication with your Primary care physician.  Next Appointment  Please make an appointment with Dr Berkley Harvey in 2-3 weeks   I look forward to talking with you again at our next visit. If you have any questions or concerns before then, please call the clinic at [phone].  Take Care,   Dr Phill Myron

## 2014-09-07 NOTE — Progress Notes (Signed)
  Patient name: Dominique [name] MRN [id number]  Date of birth: [birth year]  CC & HPI:  Dominique [name] is a 39 y.o. female presenting today for follow-up for right thigh pain.  She continues to endorse right superior/anterior thigh pain that radiates to her lateral hip.  Pain is constant but worse with activity.  Pain present upon waking, but is still able to sleep through the night without pain.  Continues to deny fevers, chills, night sweats.  Using 3-4 Ultram daily.  Denies lower back pain.  Denies lower extremity numbness or tingling.   ROS: See HPI   Medical & Surgical Hx:  Reviewed  Medications & Allergies: Reviewed  Social History: Reviewed:   Objective Findings:  Vitals: BP 122/70 mmHg  Pulse 62  Ht 4\' 7"  (1.397 m)  Wt 201 lb (91.173 kg)  BMI 46.72 kg/m2  Gen: NAD CV: RRR w/o m/r/g, pulses +2 b/l Resp: CTAB w/ normal respiratory effort Back Exam:  Inspection: Unremarkable  Palpable tenderness: None. Leg strength: Quad: 5/5 Hamstring: 5/5 Hip flexor: 5/5 Hip abductors: 5/5  Strength at foot: Plantar-flexion: 5/5 Dorsi-flexion: 5/5 Eversion: 5/5 Inversion: 5/5  Sensory change: Gross sensation intact to all lumbar and sacral dermatomes.  Reflexes: 2+ at both patellar tendons, 2+ at achilles tendons, Babinski's downgoing.  Gait unremarkable. SLR laying: Negative  XSLR laying: Negative  FABER: negative. - Pain reproduced with resisted internal rotation  Assessment & Plan:   Right thigh pain Right thigh pain persists without improvement.  She was unable to get x-rays, but is scheduled for MRI this Friday.  She takes 3-4 Ultram daily.  Continues to have no neurovascular symptoms, and d-dimer was negative.  - Continue PRICE therapy and Ultram - refilled # 30

## 2014-09-10 ENCOUNTER — Other Ambulatory Visit: Payer: No Typology Code available for payment source

## 2014-09-11 ENCOUNTER — Ambulatory Visit
Admission: RE | Admit: 2014-09-11 | Discharge: 2014-09-11 | Disposition: A | Discharge status: Home / Self Care | Payer: No Typology Code available for payment source | Source: Ambulatory Visit | Attending: Family Medicine | Admitting: Family Medicine

## 2014-09-11 ENCOUNTER — Other Ambulatory Visit: Payer: Self-pay | Admitting: Family Medicine

## 2014-09-11 DIAGNOSIS — M79651 Pain in right thigh: Secondary | ICD-10-CM

## 2014-09-16 ENCOUNTER — Telehealth: Payer: Self-pay | Admitting: Family Medicine

## 2014-09-16 DIAGNOSIS — M79604 Pain in right leg: Secondary | ICD-10-CM

## 2014-09-16 DIAGNOSIS — M79651 Pain in right thigh: Secondary | ICD-10-CM

## 2014-09-16 MED ORDER — TRAMADOL HCL 50 MG PO TABS: 50.0000 mg | ORAL_TABLET | Freq: Three times a day (TID) | ORAL | Status: DC | PRN

## 2014-09-16 NOTE — Telephone Encounter (Signed)
Pt called for 2 things. One is she needs a refill on her Tramadol, she will be out in 2 days. The second thing is that she would like to know what her MRI results were. jw

## 2014-09-16 NOTE — Telephone Encounter (Signed)
Called and discussed the results of her MRI that did not show any concerning findings.  She continues to have right anterior/right lateral thigh pain.  - Referred to physical therapy - Faxed in prescription for Ultram - She will f/u or call in 1 month - continue heating pads / ice as needed.

## 2014-09-16 NOTE — Telephone Encounter (Signed)
Will forward to PCP for refill request of Tramadol and MRI results. Xiadani Damman, CMA.

## 2014-09-29 ENCOUNTER — Telehealth: Payer: Self-pay | Admitting: Family Medicine

## 2014-09-29 DIAGNOSIS — M79604 Pain in right leg: Secondary | ICD-10-CM

## 2014-09-29 NOTE — Telephone Encounter (Signed)
Pt requesting refill of Tramadol. Will forward to PCP for review. Adams,Latoya, CMA.

## 2014-09-29 NOTE — Telephone Encounter (Signed)
Pt wants to know if MD will refill her tramadol, says she typically takes 3 per day but the days she works she is having to take up to 5, she only has enough to last about 2 more days. Pt is also scheduled for a f/u appt with Berkley Harvey.

## 2014-09-30 ENCOUNTER — Other Ambulatory Visit: Payer: Self-pay | Admitting: Family Medicine

## 2014-10-01 MED ORDER — TRAMADOL HCL 50 MG PO TABS: 50.0000 mg | ORAL_TABLET | Freq: Four times a day (QID) | ORAL | Status: DC | PRN

## 2014-10-01 NOTE — Telephone Encounter (Signed)
Checking status.

## 2014-10-01 NOTE — Telephone Encounter (Signed)
Called and discussed with patient that now that the xrays and MRI are negative we should treat this with physical therapy, ice/heat, NSAIDs, tylenol. Will discuss other fibromyalgia medications at next visit.  - Refilled Ultram # 120 q 6hrs prn - 0 refills - Is scheduled for PT - She does report for stress at work, causing her fibromyalgia to be worse

## 2014-10-02 ENCOUNTER — Telehealth: Payer: Self-pay | Admitting: Family Medicine

## 2014-10-02 NOTE — Telephone Encounter (Signed)
Spoke with pharmacy and patient has already picked up this medication. Camil Wilhelmsen,CMA

## 2014-10-02 NOTE — Telephone Encounter (Signed)
Pharmacy wouldn't process rx refill for tramadol due to Provider not showing eligible to prescribe.  Please contact pharmacy to correct situation

## 2014-10-06 ENCOUNTER — Ambulatory Visit: Payer: No Typology Code available for payment source | Attending: Family Medicine | Admitting: Physical Therapy

## 2014-10-06 DIAGNOSIS — M6289 Other specified disorders of muscle: Secondary | ICD-10-CM

## 2014-10-06 DIAGNOSIS — R269 Unspecified abnormalities of gait and mobility: Secondary | ICD-10-CM | POA: Diagnosis present

## 2014-10-06 DIAGNOSIS — R29898 Other symptoms and signs involving the musculoskeletal system: Secondary | ICD-10-CM | POA: Diagnosis present

## 2014-10-06 DIAGNOSIS — R293 Abnormal posture: Secondary | ICD-10-CM | POA: Diagnosis present

## 2014-10-06 DIAGNOSIS — G729 Myopathy, unspecified: Secondary | ICD-10-CM | POA: Diagnosis present

## 2014-10-06 DIAGNOSIS — M25561 Pain in right knee: Secondary | ICD-10-CM | POA: Insufficient documentation

## 2014-10-06 NOTE — Therapy (Signed)
[place], [place], [address] Phone: [phone]   Fax:  [phone]  Physical Therapy Evaluation  Patient Details  Name: Dominique [name] MRN: [id number] Date of Birth: 1975-08-04 Referring Provider:  Olam Idler, MD  Encounter Date: 10/06/2014      PT End of Session - 10/06/14 1212    Visit Number 1   Number of Visits 12   Date for PT Re-Evaluation 12/01/14   PT Start Time 1100   PT Stop Time 1145   PT Time Calculation (min) 45 min   Activity Tolerance Patient tolerated treatment well   Behavior During Therapy [facility] for tasks assessed/performed      Past Medical History  Diagnosis Date  . Raynaud's disease   . DVT (deep venous thrombosis)   . Raynaud disease 12/21/2011  . Varicose veins   . GERD (gastroesophageal reflux disease)   . History of recurrent UTIs     Past Surgical History  Procedure Laterality Date  . Abdominal hysterectomy    . Cesarean section    . Appendectomy    . Hernia repair  2006  . Endovenous ablation saphenous vein w/ laser  02-01-2012    right greater saphenous vein and stab phlebectomy 10-20 incisions right leg  by Curt Jews, MD  . Endovenous ablation saphenous vein w/ laser Left 02-29-2012    left greater saphenous vein and stab phlebectomy left leg 10-20 incisions  by Curt Jews MD  . Esophagogastroduodenoscopy (egd) with propofol N/A 03/26/2012    Procedure: ESOPHAGOGASTRODUODENOSCOPY (EGD) WITH PROPOFOL;  Surgeon: Cleotis Nipper, MD;  Location: [facility];  Service: Endoscopy;  Laterality: N/A;  . Colonoscopy with propofol N/A 03/26/2012    Procedure: COLONOSCOPY WITH PROPOFOL;  Surgeon: Cleotis Nipper, MD;  Location: [facility];  Service: Endoscopy;  Laterality: N/A;  . Endovenous ablation saphenous vein w/ laser Left   . Esophagogastroduodenoscopy (egd) with propofol N/A 04/02/2014    Procedure: ESOPHAGOGASTRODUODENOSCOPY (EGD) WITH PROPOFOL;   Surgeon: Ronald Lobo, MD;  Location: [facility];  Service: Endoscopy;  Laterality: N/A;  . Balloon dilation N/A 04/02/2014    Procedure: BALLOON DILATION;  Surgeon: Ronald Lobo, MD;  Location: [facility];  Service: Endoscopy;  Laterality: N/A;    There were no vitals filed for this visit.  Visit Diagnosis:  Pain in joint, lower leg, right - Plan: PT plan of care cert/re-cert  Muscle tightness - Plan: PT plan of care cert/re-cert  Abnormal posture - Plan: PT plan of care cert/re-cert  Abnormality of gait - Plan: PT plan of care cert/re-cert  Weakness of right lower extremity - Plan: PT plan of care cert/re-cert      Subjective Assessment - 10/06/14 1112    Subjective pt is a 39 y.o F with CC of R anterior/ lateral quadricep pain that has been going on for about 3 months that started gradually hurting in the knee and radiating up to the quad. Since onset the pain has gotten bettter. pt reports trying to be active.    Pertinent History hx of blood clot    Limitations Sitting;Standing   How long can you sit comfortably? 30 min   How long can you stand comfortably? 30 min   How long can you walk comfortably? 1-2 hours   Diagnostic tests 09/11/2014 MRI unremarkable   Patient Stated Goals to get back to working regular, and be pain free.    Currently in Pain? Yes   Pain Score 4  Pain Location Leg   Pain Orientation Right;Mid;Upper   Pain Descriptors / Indicators Aching;Tightness   Pain Type Chronic pain   Pain Onset More than a month ago   Pain Frequency Constant   Aggravating Factors  prolonged standing, increased stress,    Pain Relieving Factors tramadol,             [facility] PT Assessment - 10/06/14 1120    Assessment   Medical Diagnosis R quadricep pain    Onset Date/Surgical Date --  3 months   Hand Dominance Right   Next MD Visit 10/22/2014   Prior Therapy no   Precautions   Precautions None   Restrictions   Weight Bearing Restrictions No   Balance Screen    Has the patient fallen in the past 6 months No   Has the patient had a decrease in activity level because of a fear of falling?  No   Is the patient reluctant to leave their home because of a fear of falling?  No   Home Environment   Living Environment Private residence   Living Arrangements Alone   Type of [place] Access Level entry   Home Layout One level   Prior Function   Level of Independence Independent;Independent with basic ADLs   Vocation Full time employment  waiting tables   Vocation Requirements prolonged standiing, walking, carrying   Leisure [facility],    Cognition   Overall Cognitive Status Within Functional Limits for tasks assessed   Posture/Postural Control   Posture/Postural Control Postural limitations   Postural Limitations Rounded Shoulders;Forward head;Increased lumbar lordosis   ROM / Strength   AROM / PROM / Strength AROM;PROM;Strength   AROM   AROM Assessment Site Knee;Hip   Right/Left Hip Right;Left   Right Hip Extension 15   Right Hip Flexion 88   Right Hip ABduction 38   Right Hip ADduction 12   Left Hip Extension 15   Left Hip Flexion 88   Left Hip ABduction 38   Left Hip ADduction 12   Right/Left Knee Right;Left   Right Knee Extension --  +2   Right Knee Flexion 112  tightness noted at endrange   Left Knee Extension 0   Left Knee Flexion 112   PROM   PROM Assessment Site Knee;Hip   Strength   Strength Assessment Site Hip;Knee   Right/Left Hip Right;Left   Right Hip Flexion 4+/5  pain during testing   Right Hip Extension 4+/5   Right Hip ABduction 5/5   Right Hip ADduction 5/5   Left Hip Flexion 5/5   Left Hip Extension 4+/5   Left Hip ABduction 5/5   Left Hip ADduction 4+/5  pain with testing/hand placement   Right/Left Knee Right;Left   Right Knee Flexion 5/5   Right Knee Extension 4+/5  pain during testing   Left Knee Flexion 5/5   Left Knee Extension 5/5   Palpation   Palpation comment tenderness  located at insertion and muscle belly of the retus femoris    Special Tests    Special Tests Knee Special Tests   Knee Special tests  other   other    Findings Positive   Side  Right   Comments Marcello Moores test   Ambulation/Gait   Gait Pattern Step-through pattern;Decreased stride length;Antalgic;Wide base of support  PT Education - 10/06/14 1211    Education provided Yes   Education Details evaluation findings, POC, Goals, HEP, quadricep anatomy education   Person(s) Educated Patient   Methods Explanation   Comprehension Verbalized understanding          PT Short Term Goals - 10/06/14 1217    PT SHORT TERM GOAL #1   Title pt will be I with inital HEP (10/27/2014)   Time 3   Period Weeks   Status New           PT Long Term Goals - 10/06/14 1218    PT LONG TERM GOAL #1   Title pt will be I with all HEP given (12/01/2014)   Time 6   Period Weeks   Status New   PT LONG TERM GOAL #2   Title pt will be able to verbalize and demonstrate techniques to reduce R quad pain and reinjurgy via HEP, and postural awarness (12/01/2014)   Time 6   Period Weeks   Status New   PT LONG TERM GOAL #3   Title pt will increase R knee and hip strength to > 4+/5 with < 2/10 pain to assist with standing and walking endurance (12/01/2014)   Time 6   Period Weeks   Status New   PT LONG TERM GOAL #4   Title pt will be able to tolerate standing for > 45 minutes with < 2/10 pain to assist with job related task (12/01/2014)   Time [place] - 10/06/14 1212    Clinical Impression Statement Dominique [name] presents to [facility] with CC of R quadricep pain that gradually started about 3-4 months ago. R knee AROM is WFL compared bil with tightness at endrange flexion. MMT revealed mild weakness in the R knee secondary to pain, and mild weakness in the hip musculature. Palpation revealed tenderness located at the  insertion and muscle belly of the rectus femoris. pt would benefit from physical therapy to reduce pain and maximize her function by addresing the impairments listed.    Pt will benefit from skilled therapeutic intervention in order to improve on the following deficits Abnormal gait;Decreased activity tolerance;Decreased endurance;Pain;Improper body mechanics;Postural dysfunction;Obesity;Decreased strength;Decreased mobility;Decreased range of motion;Increased muscle spasms;Hypomobility   Rehab Potential Good   PT Frequency 1x / week   PT Duration 12 weeks   PT Treatment/Interventions ADLs/Self Care Home Management;Cryotherapy;Iontophoresis 4mg /ml Dexamethasone;Electrical Stimulation;Moist Heat;Ultrasound;Therapeutic activities;Manual techniques;Therapeutic exercise;Taping;Dry needling;Passive range of motion;Patient/family education   PT Next Visit Plan assess response to HEP, modalities PRN, manual, stretching of quad, hip strengthening.    PT Home Exercise Plan hip flexor stretching, standing hip extension/ abduction, hamstring stretch   Consulted and Agree with Plan of Care Patient         Problem List Patient Active Problem List   Diagnosis Date Noted  . Dysuria 08/14/2014  . Urinary incontinence 08/14/2014  . Hematuria 08/14/2014  . Right thigh pain 08/14/2014  . UTI (urinary tract infection) 07/29/2014  . Dermatitis 07/02/2014  . Anemia, iron deficiency 03/09/2014  . Personal history of DVT (deep vein thrombosis)   . Skin lesion of breast 01/13/2014  . Fibromyalgia 12/11/2013  . Hallucinations 12/11/2013  . Total body pain 07/18/2013  . Internal hemorrhoid, bleeding 03/27/2013  . Hand swelling 12/27/2012  . 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 Cystitis, chronic 10/15/2012  . Leg swelling 06/18/2012  .  Gastritis 04/02/2012  . Obesity (BMI 30-39.9) 02/05/2012  . Varicose veins of lower extremities with other complications 75/44/9201   Starr Lake PT, DPT, LAT, ATC   10/06/2014  12:26 PM      [facility] [address] [place], [place], [address] Phone: [phone]   Fax:  [phone]

## 2014-10-06 NOTE — Patient Instructions (Addendum)
  Trigger Point Dry Needling  . What is Trigger Point Dry Needling (DN)? o DN is a physical therapy technique used to treat muscle pain and dysfunction. Specifically, DN helps deactivate muscle trigger points (muscle knots).  o A thin filiform needle is used to penetrate the skin and stimulate the underlying trigger point. The goal is for a local twitch response (LTR) to occur and for the trigger point to relax. No medication of any kind is injected during the procedure.   . What Does Trigger Point Dry Needling Feel Like?  o The procedure feels different for each individual patient. Some patients report that they do not actually feel the needle enter the skin and overall the process is not painful. Very mild bleeding may occur. However, many patients feel a deep cramping in the muscle in which the needle was inserted. This is the local twitch response.   . How Will I feel after the treatment? o Soreness is normal, and the onset of soreness may not occur for a few hours. Typically this soreness does not last longer than two days.  o Bruising is uncommon, however; ice can be used to decrease any possible bruising.  o In rare cases feeling tired or nauseous after the treatment is normal. In addition, your symptoms may get worse before they get better, this period will typically not last longer than 24 hours.   . What Can I do After My Treatment? o Increase your hydration by drinking more water for the next 24 hours. o You may place ice or heat on the areas treated that have become sore, however, do not use heat on inflamed or bruised areas. Heat often brings more relief post needling. o You can continue your regular activities, but vigorous activity is not recommended initially after the treatment for 24 hours. o DN is best combined with other physical therapy such as strengthening, stretching, and other therapies.         Robinette Esters PT, DPT, LAT, ATC  [facility] Phone: [phone]      

## 2014-10-19 ENCOUNTER — Ambulatory Visit: Payer: No Typology Code available for payment source | Attending: Family Medicine | Admitting: Physical Therapy

## 2014-10-19 DIAGNOSIS — R293 Abnormal posture: Secondary | ICD-10-CM | POA: Diagnosis present

## 2014-10-19 DIAGNOSIS — R29898 Other symptoms and signs involving the musculoskeletal system: Secondary | ICD-10-CM | POA: Diagnosis present

## 2014-10-19 DIAGNOSIS — M25561 Pain in right knee: Secondary | ICD-10-CM | POA: Diagnosis present

## 2014-10-19 DIAGNOSIS — M6289 Other specified disorders of muscle: Secondary | ICD-10-CM

## 2014-10-19 DIAGNOSIS — R269 Unspecified abnormalities of gait and mobility: Secondary | ICD-10-CM | POA: Insufficient documentation

## 2014-10-19 DIAGNOSIS — G729 Myopathy, unspecified: Secondary | ICD-10-CM | POA: Diagnosis present

## 2014-10-19 NOTE — Therapy (Signed)
[place], [place], [address] Phone: [phone]   Fax:  [phone]  Physical Therapy Treatment  Patient Details  Name: Dominique [name] MRN: [id number] Date of Birth: Nov 28, 1975 Referring Provider:  Olam Idler, MD  Encounter Date: 10/19/2014      PT End of Session - 10/19/14 1151    Visit Number 2   Number of Visits 12   Date for PT Re-Evaluation 12/01/14   PT Start Time 1110  pt arrived late and needed to use restroom before session   PT Stop Time 1150   PT Time Calculation (min) 40 min   Activity Tolerance Patient tolerated treatment well   Behavior During Therapy [facility] for tasks assessed/performed      Past Medical History  Diagnosis Date  . Raynaud's disease   . DVT (deep venous thrombosis)   . Raynaud disease 12/21/2011  . Varicose veins   . GERD (gastroesophageal reflux disease)   . History of recurrent UTIs     Past Surgical History  Procedure Laterality Date  . Abdominal hysterectomy    . Cesarean section    . Appendectomy    . Hernia repair  2006  . Endovenous ablation saphenous vein w/ laser  02-01-2012    right greater saphenous vein and stab phlebectomy 10-20 incisions right leg  by Curt Jews, MD  . Endovenous ablation saphenous vein w/ laser Left 02-29-2012    left greater saphenous vein and stab phlebectomy left leg 10-20 incisions  by Curt Jews MD  . Esophagogastroduodenoscopy (egd) with propofol N/A 03/26/2012    Procedure: ESOPHAGOGASTRODUODENOSCOPY (EGD) WITH PROPOFOL;  Surgeon: Cleotis Nipper, MD;  [facility]: [facility];  Service: Endoscopy;  Laterality: N/A;  . Colonoscopy with propofol N/A 03/26/2012    Procedure: COLONOSCOPY WITH PROPOFOL;  Surgeon: Cleotis Nipper, MD;  Location: [facility];  Service: Endoscopy;  Laterality: N/A;  . Endovenous ablation saphenous vein w/ laser Left   . Esophagogastroduodenoscopy (egd) with propofol N/A 04/02/2014   Procedure: ESOPHAGOGASTRODUODENOSCOPY (EGD) WITH PROPOFOL;  Surgeon: Ronald Lobo, MD;  [facility]: [facility];  Service: Endoscopy;  Laterality: N/A;  . Balloon dilation N/A 04/02/2014    Procedure: BALLOON DILATION;  Surgeon: Ronald Lobo, MD;  Location: [facility];  Service: Endoscopy;  Laterality: N/A;    There were no vitals filed for this visit.  Visit Diagnosis:  Pain in joint, lower leg, right  Muscle tightness  Abnormal posture  Abnormality of gait  Weakness of right lower extremity      Subjective Assessment - 10/19/14 1114    Subjective Did exercises "some."  Pt reports overall feeling better, and no pain when coming into PT today.   Patient Stated Goals to get back to working regular, and be pain free.    Currently in Pain? No/denies   Aggravating Factors  prolonged standing, increased stress   Pain Relieving Factors tramadol                         [facility] Adult PT Treatment/Exercise - 10/19/14 1116    Knee/Hip Exercises: Stretches   Passive Hamstring Stretch Right;3 reps;30 seconds   Hip Flexor Stretch 3 reps;30 seconds;Right   Knee/Hip Exercises: Aerobic   Nustep Level 2 x 5 min   Knee/Hip Exercises: Standing   Hip Abduction Both;2 sets;15 reps   Modalities   Modalities Ultrasound   Ultrasound   Ultrasound Location R ant/medial thigh   Ultrasound Parameters 100% DC,  1 mHz freq, 1.2 w/cm2 x 8 min   Ultrasound Goals Pain                  PT Short Term Goals - 10/19/14 1153    PT SHORT TERM GOAL #1   Title pt will be I with inital HEP (10/27/2014)   Status On-going           PT Long Term Goals - 10/19/14 1153    PT LONG TERM GOAL #1   Title pt will be I with all HEP given (12/01/2014)   Status On-going   PT LONG TERM GOAL #2   Title pt will be able to verbalize and demonstrate techniques to reduce R quad pain and reinjurgy via HEP, and postural awarness (12/01/2014)   Status On-going   PT LONG TERM GOAL #3   Title  pt will increase R knee and hip strength to > 4+/5 with < 2/10 pain to assist with standing and walking endurance (12/01/2014)   Status On-going   PT LONG TERM GOAL #4   Title pt will be able to tolerate standing for > 45 minutes with < 2/10 pain to assist with job related task (12/01/2014)   Status On-going               Plan - 10/19/14 1152    Clinical Impression Statement Pt reports decreased pain overall with mild increase in pain during exercises.  Trialed ultrasound to see if pt has longer term pain relief.  Reported decreased pain after session.  Will continue to benefit from PT to decrease pain and maximize function.   PT Next Visit Plan assess response to [place], continue stretching, manual PRN, hip strengthening   Consulted and Agree with Plan of Care Patient        Problem List Patient Active Problem List   Diagnosis Date Noted  . Dysuria 08/14/2014  . Urinary incontinence 08/14/2014  . Hematuria 08/14/2014  . Right thigh pain 08/14/2014  . UTI (urinary tract infection) 07/29/2014  . Dermatitis 07/02/2014  . Anemia, iron deficiency 03/09/2014  . Personal history of DVT (deep vein thrombosis)   . Skin lesion of breast 01/13/2014  . Fibromyalgia 12/11/2013  . Hallucinations 12/11/2013  . Total body pain 07/18/2013  . Internal hemorrhoid, bleeding 03/27/2013  . Hand swelling 12/27/2012  . 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 Cystitis, chronic 10/15/2012  . Leg swelling 06/18/2012  . Gastritis 04/02/2012  . Obesity (BMI 30-39.9) 02/05/2012  . Varicose veins of lower extremities with other complications 20/94/7096   Laureen Abrahams, PT, DPT 10/19/2014 11:54 AM  [facility] [address] [place], [place], [address] Phone: [phone]   Fax:  [phone]

## 2014-10-22 ENCOUNTER — Ambulatory Visit (INDEPENDENT_AMBULATORY_CARE_PROVIDER_SITE_OTHER): Payer: No Typology Code available for payment source | Admitting: Family Medicine

## 2014-10-22 ENCOUNTER — Encounter: Payer: Self-pay | Admitting: Family Medicine

## 2014-10-22 VITALS — BP 97/64 | HR 93 | Temp 98.1°F | Ht <= 58 in | Wt 203.4 lb

## 2014-10-22 DIAGNOSIS — M79651 Pain in right thigh: Secondary | ICD-10-CM | POA: Diagnosis not present

## 2014-10-22 DIAGNOSIS — M79604 Pain in right leg: Secondary | ICD-10-CM | POA: Diagnosis not present

## 2014-10-22 DIAGNOSIS — R319 Hematuria, unspecified: Secondary | ICD-10-CM

## 2014-10-22 MED ORDER — PROMETHAZINE HCL 25 MG PO TABS: 12.5000 mg | ORAL_TABLET | Freq: Four times a day (QID) | ORAL | Status: DC | PRN

## 2014-10-22 MED ORDER — TRAMADOL HCL 50 MG PO TABS: 50.0000 mg | ORAL_TABLET | Freq: Four times a day (QID) | ORAL | Status: DC | PRN

## 2014-10-23 NOTE — Assessment & Plan Note (Signed)
Discussed SSRI and SNRI today but she is hesitate to make any changes as she is improving. Previously had "problems with SSRI" doesn't recall trying SNRI  - Discussed gabapentin or lyrica but she doesn't want any changes - Discussed ultram would not be a chronic medication but will continue x 2 months so she can continues with work and PT - f/u in 2 months

## 2014-10-23 NOTE — Progress Notes (Signed)
  Patient name: Dominique [name] MRN [id number]  Date of birth: 1975-11-30  CC & HPI:  Dominique [name] is a 39 y.o. female presenting today for right thigh pain. She reports some improvement with PT. Continues to take Ultram 3-4 daily which improves her pain and allows her to continue to work. Currently applying for disability.   ROS: See HPI   Medical & Surgical Hx:  Reviewed  Medications & Allergies: Reviewed  Social History: Reviewed:   Objective Findings:  Vitals: BP 97/64 mmHg  Pulse 93  Temp(Src) 98.1 F (36.7 C) (Oral)  Ht 4\' 7"  (1.397 m)  Wt 203 lb 6.4 oz (92.262 kg)  BMI 47.27 kg/m2  Gen: NAD CV: RRR w/o m/r/g, pulses +2 b/l Resp: CTAB w/ normal respiratory effort Right thigh: Tenderness to palpation of anterior thigh w/o swelling, erthyema; Gross strength and sensation intact.   Assessment & Plan:   Right thigh pain Discussed SSRI and SNRI today but she is hesitate to make any changes as she is improving. Previously had "problems with SSRI" doesn't recall trying SNRI  - Discussed gabapentin or lyrica but she doesn't want any changes - Discussed ultram would not be a chronic medication but will continue x 2 months so she can continues with work and PT - f/u in 2 months

## 2014-10-26 ENCOUNTER — Ambulatory Visit: Payer: No Typology Code available for payment source | Admitting: Physical Therapy

## 2014-10-26 DIAGNOSIS — R29898 Other symptoms and signs involving the musculoskeletal system: Secondary | ICD-10-CM

## 2014-10-26 DIAGNOSIS — R269 Unspecified abnormalities of gait and mobility: Secondary | ICD-10-CM

## 2014-10-26 DIAGNOSIS — M25561 Pain in right knee: Secondary | ICD-10-CM

## 2014-10-26 DIAGNOSIS — R293 Abnormal posture: Secondary | ICD-10-CM

## 2014-10-26 DIAGNOSIS — M6289 Other specified disorders of muscle: Secondary | ICD-10-CM

## 2014-10-26 NOTE — Therapy (Signed)
[place], [place], [address] Phone: [phone]   Fax:  [phone]  Physical Therapy Treatment  Patient Details  Name: Dominique [name] MRN: [id number] Date of Birth: 1975-01-12 No Data Recorded  Encounter Date: 10/26/2014      PT End of Session - 10/26/14 1143    Visit Number 3   Number of Visits 12   Date for PT Re-Evaluation 12/01/14   PT Start Time 1103   PT Stop Time 1143   PT Time Calculation (min) 40 min   Activity Tolerance Patient tolerated treatment well   Behavior During Therapy [facility] for tasks assessed/performed      Past Medical History  Diagnosis Date  . Raynaud's disease   . DVT (deep venous thrombosis) ([place])   . Raynaud disease 12/21/2011  . Varicose veins   . GERD (gastroesophageal reflux disease)   . History of recurrent UTIs     Past Surgical History  Procedure Laterality Date  . Abdominal hysterectomy    . Cesarean section    . Appendectomy    . Hernia repair  2006  . Endovenous ablation saphenous vein w/ laser  02-01-2012    right greater saphenous vein and stab phlebectomy 10-20 incisions right leg  by Curt Jews, MD  . Endovenous ablation saphenous vein w/ laser Left 02-29-2012    left greater saphenous vein and stab phlebectomy left leg 10-20 incisions  by Curt Jews MD  . Esophagogastroduodenoscopy (egd) with propofol N/A 03/26/2012    Procedure: ESOPHAGOGASTRODUODENOSCOPY (EGD) WITH PROPOFOL;  Surgeon: Cleotis Nipper, MD;  Location: [facility];  Service: Endoscopy;  Laterality: N/A;  . Colonoscopy with propofol N/A 03/26/2012    Procedure: COLONOSCOPY WITH PROPOFOL;  Surgeon: Cleotis Nipper, MD;  Location: [facility];  Service: Endoscopy;  Laterality: N/A;  . Endovenous ablation saphenous vein w/ laser Left   . Esophagogastroduodenoscopy (egd) with propofol N/A 04/02/2014    Procedure: ESOPHAGOGASTRODUODENOSCOPY (EGD) WITH PROPOFOL;  Surgeon: Ronald Lobo,  MD;  Location: [facility];  Service: Endoscopy;  Laterality: N/A;  . Balloon dilation N/A 04/02/2014    Procedure: BALLOON DILATION;  Surgeon: Ronald Lobo, MD;  Location: [facility];  Service: Endoscopy;  Laterality: N/A;    There were no vitals filed for this visit.  Visit Diagnosis:  Pain in joint, lower leg, right  Muscle tightness  Abnormal posture  Weakness of right lower extremity  Abnormality of gait      Subjective Assessment - 10/26/14 1110    Subjective Feels a little stiff today but better overall.  Felt ultrasound really helped.   Patient Stated Goals to get back to working regular, and be pain free.    Currently in Pain? Yes   Pain Score 5    Pain Location Leg   Pain Orientation Right;Mid;Upper   Pain Descriptors / Indicators Aching;Tightness   Pain Type Chronic pain   Pain Onset More than a month ago   Pain Frequency Constant   Aggravating Factors  prolonged standing, increased stress   Pain Relieving Factors tramadol, ultrasound                         [facility] Adult PT Treatment/Exercise - 10/26/14 1112    Knee/Hip Exercises: Stretches   Passive Hamstring Stretch Right;3 reps;30 seconds   Quad Stretch Right;3 reps;30 seconds   Quad Stretch Limitations prone with strap   Hip Flexor Stretch 3 reps;30 seconds;Right   Knee/Hip Exercises: Aerobic  Nustep Level 3 x 8 min   Modalities   Modalities Ultrasound   Ultrasound   Ultrasound Location R ant/medial thigh   Ultrasound Parameters 100% DC, 75mHz freq; 1.2 w/cm2 x 8 min   Ultrasound Goals Pain                  PT Short Term Goals - 10/19/14 1153    PT SHORT TERM GOAL #1   Title pt will be I with inital HEP (10/27/2014)   Status On-going           PT Long Term Goals - 10/19/14 1153    PT LONG TERM GOAL #1   Title pt will be I with all HEP given (12/01/2014)   Status On-going   PT LONG TERM GOAL #2   Title pt will be able to verbalize and demonstrate techniques  to reduce R quad pain and reinjurgy via HEP, and postural awarness (12/01/2014)   Status On-going   PT LONG TERM GOAL #3   Title pt will increase R knee and hip strength to > 4+/5 with < 2/10 pain to assist with standing and walking endurance (12/01/2014)   Status On-going   PT LONG TERM GOAL #4   Title pt will be able to tolerate standing for > 45 minutes with < 2/10 pain to assist with job related task (12/01/2014)   Status On-going               Plan - 10/26/14 1143    Clinical Impression Statement Moderate cues needed for exercises and technique with stretches.  Reinforced compliance with HEP as pt reports limited compliance.   PT Next Visit Plan [place], continue stretching, manual PRN, hip strengthening   PT Home Exercise Plan hip flexor stretching, standing hip extension/ abduction, hamstring stretch   Consulted and Agree with Plan of Care Patient        Problem List Patient Active Problem List   Diagnosis Date Noted  . Dysuria 08/14/2014  . Urinary incontinence 08/14/2014  . Hematuria 08/14/2014  . Right thigh pain 08/14/2014  . UTI (urinary tract infection) 07/29/2014  . Dermatitis 07/02/2014  . Anemia, iron deficiency 03/09/2014  . Personal history of DVT (deep vein thrombosis)   . Skin lesion of breast 01/13/2014  . Fibromyalgia 12/11/2013  . Hallucinations 12/11/2013  . Total body pain 07/18/2013  . Internal hemorrhoid, bleeding 03/27/2013  . Hand swelling 12/27/2012  . 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 Cystitis, chronic 10/15/2012  . Leg swelling 06/18/2012  . Gastritis 04/02/2012  . Obesity (BMI 30-39.9) 02/05/2012  . Varicose veins of lower extremities with other complications 09/81/1914   Laureen Abrahams, PT, DPT 10/26/2014 11:45 AM  [facility] [address] [place], [place], [address] Phone: [phone]   Fax:  [phone]  Name: Zian Mohamed MRN: [id number] Date of Birth:  1975/12/16

## 2014-10-27 ENCOUNTER — Other Ambulatory Visit: Payer: Self-pay | Admitting: Family Medicine

## 2014-10-27 NOTE — Telephone Encounter (Signed)
Pt called and needs a refill on her Amitriptyline called in. jw

## 2014-10-27 NOTE — Telephone Encounter (Signed)
Will close this encounter.  There is already a refill encounter for this request from the pharmacy. Jazmin Hartsell,CMA

## 2014-11-02 ENCOUNTER — Ambulatory Visit: Payer: No Typology Code available for payment source | Admitting: Physical Therapy

## 2014-11-02 DIAGNOSIS — M25561 Pain in right knee: Secondary | ICD-10-CM

## 2014-11-02 DIAGNOSIS — R29898 Other symptoms and signs involving the musculoskeletal system: Secondary | ICD-10-CM

## 2014-11-02 DIAGNOSIS — M6289 Other specified disorders of muscle: Secondary | ICD-10-CM

## 2014-11-02 DIAGNOSIS — R293 Abnormal posture: Secondary | ICD-10-CM

## 2014-11-02 DIAGNOSIS — R269 Unspecified abnormalities of gait and mobility: Secondary | ICD-10-CM

## 2014-11-02 NOTE — Therapy (Signed)
[place], [place], [address] Phone: [phone]   Fax:  [phone]  Physical Therapy Treatment  Patient Details  Name: Dominique [name] MRN: [id number] Date of Birth: 02-Jul-1975 Referring Provider: Olam Idler, MD  Encounter Date: 11/02/2014      PT End of Session - 11/02/14 1130    Visit Number 4   Number of Visits 12   Date for PT Re-Evaluation 12/01/14   PT Start Time 1100   PT Stop Time 1124   PT Time Calculation (min) 24 min   Activity Tolerance Patient tolerated treatment well   Behavior During Therapy [facility] for tasks assessed/performed      Past Medical History  Diagnosis Date  . Raynaud's disease   . DVT (deep venous thrombosis) ([place])   . Raynaud disease 12/21/2011  . Varicose veins   . GERD (gastroesophageal reflux disease)   . History of recurrent UTIs     Past Surgical History  Procedure Laterality Date  . Abdominal hysterectomy    . Cesarean section    . Appendectomy    . Hernia repair  2006  . Endovenous ablation saphenous vein w/ laser  02-01-2012    right greater saphenous vein and stab phlebectomy 10-20 incisions right leg  by Curt Jews, MD  . Endovenous ablation saphenous vein w/ laser Left 02-29-2012    left greater saphenous vein and stab phlebectomy left leg 10-20 incisions  by Curt Jews MD  . Esophagogastroduodenoscopy (egd) with propofol N/A 03/26/2012    Procedure: ESOPHAGOGASTRODUODENOSCOPY (EGD) WITH PROPOFOL;  Surgeon: Cleotis Nipper, MD;  Location: [facility];  Service: Endoscopy;  Laterality: N/A;  . Colonoscopy with propofol N/A 03/26/2012    Procedure: COLONOSCOPY WITH PROPOFOL;  Surgeon: Cleotis Nipper, MD;  Location: [facility];  Service: Endoscopy;  Laterality: N/A;  . Endovenous ablation saphenous vein w/ laser Left   . Esophagogastroduodenoscopy (egd) with propofol N/A 04/02/2014    Procedure: ESOPHAGOGASTRODUODENOSCOPY (EGD) WITH PROPOFOL;   Surgeon: Ronald Lobo, MD;  Location: [facility];  Service: Endoscopy;  Laterality: N/A;  . Balloon dilation N/A 04/02/2014    Procedure: BALLOON DILATION;  Surgeon: Ronald Lobo, MD;  Location: [facility];  Service: Endoscopy;  Laterality: N/A;    There were no vitals filed for this visit.  Visit Diagnosis:  Pain in joint, lower leg, right  Muscle tightness  Abnormal posture  Weakness of right lower extremity  Abnormality of gait      Subjective Assessment - 11/02/14 1104    Subjective Overall feels much better; wants today to be last day.  Not completely gone but better.   Pertinent History hx of blood clot    Limitations Sitting;Standing   How long can you stand comfortably? 1 hour   How long can you walk comfortably? 1-2 hours   Diagnostic tests 09/11/2014 MRI unremarkable   Patient Stated Goals to get back to working regular, and be pain free.    Currently in Pain? Yes   Pain Score 4   due to fibromyalgia "all over pain 7/10"   Pain Location Leg   Pain Orientation Right;Mid;Upper   Pain Descriptors / Indicators Aching;Tightness   Pain Type Chronic pain   Pain Onset More than a month ago   Pain Frequency Constant   Aggravating Factors  prolonged standing; increased stress   Pain Relieving Factors epsom salt; tramadol; ultrasound            [facility] PT Assessment - 11/02/14 1109  Assessment   Referring Provider Olam Idler, MD   Strength   Right Hip Flexion 4+/5  pt states "not bad" with pain   Right Hip Extension 4+/5   Left Hip Flexion 5/5   Left Hip Extension 4+/5   Right Knee Extension 4+/5  no pain   Left Knee Extension 5/5                     [facility] Adult PT Treatment/Exercise - 11/02/14 0001    Self-Care   Self-Care Other Self-Care Comments   Other Self-Care Comments  ways to manage pain to reduce reinjury risk; pt able to verbalize understanding   Knee/Hip Exercises: Stretches   Passive Hamstring Stretch Right;3 reps;30  seconds   Hip Flexor Stretch 3 reps;30 seconds;Right   Knee/Hip Exercises: Standing   Hip Abduction Both;2 sets;15 reps                  PT Short Term Goals - 11/02/14 1131    PT SHORT TERM GOAL #1   Title pt will be I with inital HEP (10/27/2014)   Status Achieved           PT Long Term Goals - 11/02/14 1109    PT LONG TERM GOAL #1   Title pt will be I with all HEP given (12/01/2014)   Status Achieved   PT LONG TERM GOAL #2   Title pt will be able to verbalize and demonstrate techniques to reduce R quad pain and reinjurgy via HEP, and postural awarness (12/01/2014)   Status Achieved   PT LONG TERM GOAL #3   Title pt will increase R knee and hip strength to > 4+/5 with < 2/10 pain to assist with standing and walking endurance (12/01/2014)   Baseline 4+/5 without pain   Status Achieved   PT LONG TERM GOAL #4   Title pt will be able to tolerate standing for > 45 minutes with < 2/10 pain to assist with job related task (12/01/2014)   Status Achieved               Plan - 11/02/14 1130    Clinical Impression Statement Pt ready for d/c and requesting d/c at this time.  Still has occasional pain but overall reports ways to manage pain and improved RLE pain.   PT Next Visit Plan d/c PT today        Problem List Patient Active Problem List   Diagnosis Date Noted  . Dysuria 08/14/2014  . Urinary incontinence 08/14/2014  . Hematuria 08/14/2014  . Right thigh pain 08/14/2014  . UTI (urinary tract infection) 07/29/2014  . Dermatitis 07/02/2014  . Anemia, iron deficiency 03/09/2014  . Personal history of DVT (deep vein thrombosis)   . Skin lesion of breast 01/13/2014  . Fibromyalgia 12/11/2013  . Hallucinations 12/11/2013  . Total body pain 07/18/2013  . Internal hemorrhoid, bleeding 03/27/2013  . Hand swelling 12/27/2012  . 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 Cystitis, chronic 10/15/2012  . Leg swelling 06/18/2012  . Gastritis 04/02/2012  .  Obesity (BMI 30-39.9) 02/05/2012  . Varicose veins of lower extremities with other complications 78/93/8101   Laureen Abrahams, PT, DPT 11/02/2014 11:34 AM  [facility] [address] [place], [place], [address] Phone: [phone]   Fax:  [phone]  Name: Lynn Recendiz MRN: [id number] Date of Birth: [birth year]    PHYSICAL THERAPY DISCHARGE SUMMARY  Visits from Start of Care: 4  Current functional level related to goals / functional outcomes: See above; goals met   Remaining deficits: Pt reports occasional RLE pain but is able to verbalize ways to manage pain and minimize risk of reinjury.   Education / Equipment: HEP  Plan: Patient agrees to discharge.  Patient goals were met. Patient is being discharged due to meeting the stated rehab goals.  ?????    Laureen Abrahams, PT, DPT 11/02/2014 11:34 AM   [facility] [address] [place] [address]  [phone] (office) [phone] (fax)

## 2014-11-03 NOTE — Telephone Encounter (Signed)
Pt calling and states that the pharmacy is telling her that the medication had not been received. Sadie Reynolds, ASA

## 2014-11-03 NOTE — Telephone Encounter (Signed)
Patient advised that medication has been ready at the pharmacy for the past 7 days.  Derl Barrow, RN

## 2014-11-04 ENCOUNTER — Encounter: Payer: Self-pay | Admitting: Family Medicine

## 2014-11-04 ENCOUNTER — Ambulatory Visit (INDEPENDENT_AMBULATORY_CARE_PROVIDER_SITE_OTHER): Payer: No Typology Code available for payment source | Admitting: Family Medicine

## 2014-11-04 VITALS — BP 113/63 | HR 86 | Temp 98.5°F | Wt 203.0 lb

## 2014-11-04 DIAGNOSIS — R21 Rash and other nonspecific skin eruption: Secondary | ICD-10-CM

## 2014-11-04 DIAGNOSIS — M7989 Other specified soft tissue disorders: Secondary | ICD-10-CM | POA: Diagnosis not present

## 2014-11-04 MED ORDER — HYDROCORTISONE 2.5 % EX OINT: TOPICAL_OINTMENT | Freq: Two times a day (BID) | CUTANEOUS | Status: DC

## 2014-11-04 NOTE — Progress Notes (Signed)
     HPI  CC: Numbness in hands Numbness and pain in bilateral hands. Worse at night. Mostly in hands (some forearm involvement). Worse w/ movement. Described as numbness paresthesias and pain. 3rd digit is the worst. Nothing makes it better. Just have to "sit and wait". Waking up from sleep. Off and on for months. Recently developed into something constant. Has noticed weakness as well.   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 She denies using any protective barrier between the dish soap in her hands. She also denies any immediate rinsing of her hands after exposure to this detergent.  ROS: Denies any fever chills headache vision changes dizziness nausea vomiting shortness of breath lower extremity involvement.   Objective: BP 113/63 mmHg  Pulse 86  Temp(Src) 98.5 F (36.9 C) (Oral)  Wt 203 lb (92.08 kg) Gen: NAD, alert, cooperative, and pleasant. HEENT: NCAT, EOMI, PERRL, full ROM of neck Bilateral Wrist: On Inspection --notable evidence of erythema and swelling beginning at the distal third of the forearm. ROM smooth and normal with good flexion and extension and ulnar/radial deviation that is symmetrical bilaterally Palpation is normal over metacarpals, navicular, lunate, and TFCC; tendons without tenderness/ swelling Strength 5/5 in all directions without pain. Negative Finkelstein, tinel's and phalens. Capillary refill prolonged bilaterally; radial pulses relatively weak. Neuro: Alert and oriented, Speech clear, No gross deficits  Assessment and plan:  Hand swelling Patient's current signs and symptoms of unknown etiology at this time. Notable edema and slight erythema present bilaterally beyond the distal third of the forearm. Due to the extent of patient's medical history it is unlikely that the simple answer of "contact dermatitis" is the right one. 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 If this is the case the irritation to the soft tissue could be causing enough swelling for nerve compression and pain. Other considerations for a vascular etiology/arteritis versus carpal tunnel.  - Hydrocortisone ointment provided today in hopes to reduce edema and provide some moisture.    Meds ordered this encounter  Medications  . hydrocortisone 2.5 % ointment    Sig: Apply topically 2 (two) times daily.    Dispense:  453 g    Refill:  0     Elberta Leatherwood, MD,MS,  PGY2 11/05/2014 7:19 PM

## 2014-11-04 NOTE — Patient Instructions (Signed)
It was a pleasure seeing you today in our clinic. Today we discussed your hand numbness and pain. Here is the treatment plan we have discussed and agreed upon together:   - I refilled hydrocortisone ointment you were previously prescribed. - I recommendation would be to apply this ointment 2-3 times a day to hands. - Try to actively avoided all contact with dish soap and detergent to the bare skin of her hands. This may be difficult due to your duties at work but it is my belief that a lot of the swelling and irritation in your hands are due to this type of exposure. - If your symptoms continue to worsen despite these new interventions Dr. Berkley Harvey would likely want to see you back in the office.

## 2014-11-05 ENCOUNTER — Encounter (HOSPITAL_COMMUNITY): Payer: Self-pay | Admitting: *Deleted

## 2014-11-05 ENCOUNTER — Emergency Department (HOSPITAL_COMMUNITY)
Admission: EM | Admit: 2014-11-05 | Discharge: 2014-11-05 | Disposition: A | Discharge status: Home / Self Care | Payer: No Typology Code available for payment source | Attending: Emergency Medicine | Admitting: Emergency Medicine

## 2014-11-05 DIAGNOSIS — Z87440 Personal history of urinary (tract) infections: Secondary | ICD-10-CM | POA: Diagnosis not present

## 2014-11-05 DIAGNOSIS — Z792 Long term (current) use of antibiotics: Secondary | ICD-10-CM | POA: Diagnosis not present

## 2014-11-05 DIAGNOSIS — K219 Gastro-esophageal reflux disease without esophagitis: Secondary | ICD-10-CM | POA: Diagnosis not present

## 2014-11-05 DIAGNOSIS — Z8679 Personal history of other diseases of the circulatory system: Secondary | ICD-10-CM | POA: Diagnosis not present

## 2014-11-05 DIAGNOSIS — G5603 Carpal tunnel syndrome, bilateral upper limbs: Secondary | ICD-10-CM | POA: Diagnosis not present

## 2014-11-05 DIAGNOSIS — Z91040 Latex allergy status: Secondary | ICD-10-CM | POA: Insufficient documentation

## 2014-11-05 DIAGNOSIS — Z79899 Other long term (current) drug therapy: Secondary | ICD-10-CM | POA: Diagnosis not present

## 2014-11-05 DIAGNOSIS — Z86718 Personal history of other venous thrombosis and embolism: Secondary | ICD-10-CM | POA: Diagnosis not present

## 2014-11-05 DIAGNOSIS — E669 Obesity, unspecified: Secondary | ICD-10-CM | POA: Diagnosis not present

## 2014-11-05 DIAGNOSIS — M79604 Pain in right leg: Secondary | ICD-10-CM

## 2014-11-05 DIAGNOSIS — R200 Anesthesia of skin: Secondary | ICD-10-CM | POA: Diagnosis present

## 2014-11-05 DIAGNOSIS — Z791 Long term (current) use of non-steroidal anti-inflammatories (NSAID): Secondary | ICD-10-CM | POA: Diagnosis not present

## 2014-11-05 MED ORDER — NAPROXEN 375 MG PO TABS: 375.0000 mg | ORAL_TABLET | Freq: Two times a day (BID) | ORAL | Status: DC

## 2014-11-05 MED ORDER — HYDROCODONE-ACETAMINOPHEN 5-325 MG PO TABS: 1.0000 | ORAL_TABLET | Freq: Four times a day (QID) | ORAL | Status: DC | PRN

## 2014-11-05 MED ORDER — DICLOFENAC SODIUM 2 % TD SOLN: 1.0000 "application " | Freq: Two times a day (BID) | TRANSDERMAL | Status: DC

## 2014-11-05 NOTE — ED Notes (Signed)
The patient said she does not have Raynaud"s syndrome that a doctor "in her past" diagnosed her with it.

## 2014-11-05 NOTE — Assessment & Plan Note (Addendum)
Patient's current signs and symptoms of unknown etiology at this time. Notable edema and slight erythema present bilaterally beyond the distal third of the forearm. Due to the extent of patient's medical history it is unlikely that the simple answer of "contact dermatitis" is the right one. 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 If this is the case the irritation to the soft tissue could be causing enough swelling for nerve compression and pain. Other considerations for a vascular etiology/arteritis versus carpal tunnel.  - Hydrocortisone ointment provided today in hopes to reduce edema and provide some moisture.

## 2014-11-05 NOTE — ED Provider Notes (Signed)
CSN: [id number]     Arrival date & time 11/05/14  1501 History  By signing my name below, I, Dominique [name], attest that this documentation has been prepared under the direction and in the presence of Dominique Mail, PA-C.  Electronically Signed: Eustaquio [name], ED Scribe. 11/05/2014. 4:18 PM.  Chief Complaint  Patient presents with  . Numbness   The history is provided by the patient. No language interpreter was used.     HPI Comments: Dominique [name] is a 39 y.o. female who presents to the Emergency Department complaining of gradual onset, intermittent, numbness and pain to bilateral hands radiating up to upper arms x 2 weeks. Pt waits tables for work and notes mild numbness and tingling while working but states that it is worse at night after work. Pt reports that for the past week she has been waking up due to the severity of the pain. The numbness and tingling has been occurring every night for the last week. She notes that she makes sure she isn't laying on her hands while sleeping. Pt also complains of mild decrease in grip strength. Pt has discoloration of her hands but admits to washing hands frequently at work. She was seen at PCP's office 1 day ago for same symptoms and was given hydrocortisone for the discoloration, even though she was not there for the discoloration since it is chronic. Denies weakness or any other associated symptoms.    Past Medical History  Diagnosis Date  . Raynaud's disease   . DVT (deep venous thrombosis) ([place])   . Raynaud disease 12/21/2011  . Varicose veins   . GERD (gastroesophageal reflux disease)   . History of recurrent UTIs    Past Surgical History  Procedure Laterality Date  . Abdominal hysterectomy    . Cesarean section    . Appendectomy    . Hernia repair  2006  . Endovenous ablation saphenous vein w/ laser  02-01-2012    right greater saphenous vein and stab phlebectomy 10-20 incisions right leg  by Curt Jews, MD  . Endovenous ablation  saphenous vein w/ laser Left 02-29-2012    left greater saphenous vein and stab phlebectomy left leg 10-20 incisions  by Curt Jews MD  . Esophagogastroduodenoscopy (egd) with propofol N/A 03/26/2012    Procedure: ESOPHAGOGASTRODUODENOSCOPY (EGD) WITH PROPOFOL;  Surgeon: Cleotis Nipper, MD;  Location: [facility];  Service: Endoscopy;  Laterality: N/A;  . Colonoscopy with propofol N/A 03/26/2012    Procedure: COLONOSCOPY WITH PROPOFOL;  Surgeon: Cleotis Nipper, MD;  Location: [facility];  Service: Endoscopy;  Laterality: N/A;  . Endovenous ablation saphenous vein w/ laser Left   . Esophagogastroduodenoscopy (egd) with propofol N/A 04/02/2014    Procedure: ESOPHAGOGASTRODUODENOSCOPY (EGD) WITH PROPOFOL;  Surgeon: Ronald Lobo, MD;  Location: [facility];  Service: Endoscopy;  Laterality: N/A;  . Balloon dilation N/A 04/02/2014    Procedure: BALLOON DILATION;  Surgeon: Ronald Lobo, MD;  Location: [facility];  Service: Endoscopy;  Laterality: N/A;   Family History  Problem Relation Age of Onset  . Diabetes Mother   . Heart disease Mother   . Hyperlipidemia Mother   . Hypertension Mother   . Other Mother     amputation, varicose veins  . Diabetes Father   . Hyperlipidemia Father   . Hypertension Father   . Peripheral vascular disease Father   . Other Father     varicose veins  . Deep vein thrombosis Brother   . Other Brother  varicose veins   Social History  Substance Use Topics  . Smoking status: Never Smoker   . Smokeless tobacco: Never Used  . Alcohol Use: No   OB History    No data available     Review of Systems  Musculoskeletal: Positive for arthralgias.  Neurological: Positive for numbness. Negative for weakness.       + Paresthesia   Allergies  Contrast media; Latex; Zofran; Doxycycline; Eggs or egg-derived products; Lactose intolerance (gi); and Nsaids  Home Medications   Prior to Admission medications   Medication Sig Start Date End Date Taking?  Authorizing Provider  acetaminophen (TYLENOL) 325 MG tablet Take 650 mg by mouth every 6 (six) hours as needed for moderate pain.    Historical Provider, MD  amitriptyline (ELAVIL) 10 MG tablet TAKE TWO AND ONE-HALF TABLET BY MOUTH AT BEDTIME 10/28/14   Olam Idler, MD  cephALEXin (KEFLEX) 500 MG capsule Take 1 capsule (500 mg total) by mouth 4 (four) times daily. 07/29/14   Leone Brand, MD  Ferrous Sulfate (IRON) 325 (65 FE) MG TABS Take 325 mg by mouth daily. 03/09/14   Olam Idler, MD  fluconazole (DIFLUCAN) 150 MG tablet Take 1 tablet (150 mg total) by mouth once. 05/08/14   Renee A Kuneff, DO  hydrocortisone 2.5 % ointment Apply topically 2 (two) times daily. 11/04/14   Elberta Leatherwood, MD  hydrOXYzine (VISTARIL) 25 MG capsule Take 1 capsule (25 mg total) by mouth every 6 (six) hours as needed. 07/02/14   Olam Idler, MD  hyoscyamine (LEVSIN SL) 0.125 MG SL tablet Take 0.125 mg by mouth every 6 (six) hours as needed for cramping.  02/19/14   Historical Provider, MD  meloxicam (MOBIC) 15 MG tablet Take 1 tablet (15 mg total) by mouth daily. 08/17/14   Olam Idler, MD  Multiple Vitamin (MULTIVITAMIN WITH MINERALS) TABS Take 1 tablet by mouth daily.    Historical Provider, MD  mupirocin ointment (BACTROBAN) 2 % Apply to both nostrils TID for 1 month. 02/07/14   Harden Mo, MD  omeprazole (PRILOSEC) 40 MG capsule TAKE ONE CAPSULE BY MOUTH TWICE DAILY BEFORE A MEAL 10/01/14   Olam Idler, MD  phenazopyridine (PYRIDIUM) 200 MG tablet Take 1 tablet (200 mg total) by mouth 3 (three) times daily as needed for pain. 08/06/14   Rosemarie Ax, MD  promethazine (PHENERGAN) 25 MG tablet Take 0.5 tablets (12.5 mg total) by mouth every 6 (six) hours as needed. for nausea 10/22/14   Olam Idler, MD  sulfamethoxazole-trimethoprim (BACTRIM DS,SEPTRA DS) 800-160 MG per tablet Take 1 tablet by mouth 2 (two) times daily. 08/06/14   Rosemarie Ax, MD  traMADol (ULTRAM) 50 MG tablet Take 1 tablet (50  mg total) by mouth every 6 (six) hours as needed. 10/22/14   Olam Idler, MD  valACYclovir (VALTREX) 1000 MG tablet Take 0.5 tablets (500 mg total) by mouth 2 (two) times daily. 09/07/14   Olam Idler, MD   Triage Vitals: BP 97/60 mmHg  Pulse 81  Temp(Src) 98.5 F (36.9 C) (Oral)  Resp 18  SpO2 99%   Physical Exam  Constitutional: She is oriented to person, place, and time. She appears well-developed. No distress.  Noticeably obese with very small wrists and hands  HENT:  Head: Normocephalic and atraumatic.  Eyes: Conjunctivae and EOM are normal.  Neck: Neck supple. No tracheal deviation present.  Cardiovascular: Normal rate.   Pulmonary/Chest: Effort normal. No respiratory  distress.  Musculoskeletal: Normal range of motion.  Palpable radial pulses bilaterally Positive phalen's test Dry skin bilateral hands  Neurological: She is alert and oriented to person, place, and time.  Skin: Skin is warm and dry.  Psychiatric: She has a normal mood and affect. Her behavior is normal.  Nursing note and vitals reviewed.   ED Course  Procedures (including critical care time)  DIAGNOSTIC STUDIES: Oxygen Saturation is 99% on RA, normal by my interpretation.    COORDINATION OF CARE: 4:13 PM-Discussed treatment plan which includes antiinflammatories, rest, referral to orthopedist, and follow up with PCP with pt at bedside and pt agreed to plan.   Labs Review Labs Reviewed - No data to display  Imaging Review No results found.   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 She is having bilateral numbness and pain which is worse at night. This is likely carpal tunnel syndrome. Patient will be treated as such with bilateral wrist splints, cryotherapy. Patient is to follow-up with orthopedist. I have asked her to also follow-up with her primary care physician for thyroid testing. I  personally performed the services described in this documentation, which was scribed in my presence. The recorded information has been reviewed and is accurate.         Dominique Mail, PA-C 11/05/14 1637  Charlesetta Shanks, MD 11/09/14 1131

## 2014-11-05 NOTE — ED Notes (Signed)
Pt c/o bilateral hand numbness for the past two weeks. Pt states she notices is after she sleeps. Pt denies any grip weakness, pt hands are red but reports discoloration since she was 17. Pt denies neck or back pain or any recent injury to neck or back.

## 2014-11-05 NOTE — Discharge Instructions (Signed)
RICE for Routine Care of Injuries Theroutine careofmanyinjuriesincludes rest, ice, compression, and elevation (RICE therapy). RICE therapy is often recommended for injuries to soft tissues, such as a muscle strain, ligament injuries, bruises, and overuse injuries. It can also be used for some bony injuries. Using RICE therapy can help to relieve pain, lessen swelling, and enable your body to heal. Rest Rest is required to allow your body to heal. This usually involves reducing your normal activities and avoiding use of the injured part of your body. Generally, you can return to your normal activities when you are comfortable and have been given permission by your health care provider. Ice Icing your injury helps to keep the swelling down, and it lessens pain. Do not apply ice directly to your skin.  Put ice in a plastic bag.  Place a towel between your skin and the bag.  Leave the ice on for 20 minutes, 2-3 times a day. Do this for as long as you are directed by your health care provider. Compression Compression means putting pressure on the injured area. Compression helps to keep swelling down, gives support, and helps with discomfort. Compression may be done with an elastic bandage. If an elastic bandage has been applied, follow these general tips:  Remove and reapply the bandage every 3-4 hours or as directed by your health care provider.  Make sure the bandage is not wrapped too tightly, because this can cut off circulation. If part of your body beyond the bandage becomes blue, numb, cold, swollen, or more painful, your bandage is most likely too tight. If this occurs, remove your bandage and reapply it more loosely.  See your health care provider if the bandage seems to be making your problems worse rather than better. Elevation Elevation means keeping the injured area raised. This helps to lessen swelling and decrease pain. If possible, your injured area should be elevated at or  above the level of your heart or the center of your chest. [place]? You should seek medical care if:  Your pain and swelling continue.  Your symptoms are getting worse rather than improving. These symptoms may indicate that further evaluation or further X-rays are needed. Sometimes, X-rays may not show a small broken bone (fracture) until a number of days later. Make a follow-up appointment with your health care provider. WHEN SHOULD I SEEK IMMEDIATE MEDICAL CARE? You should seek immediate medical care if:  You have sudden severe pain at or below the area of your injury.  You have redness or increased swelling around your injury.  You have tingling or numbness at or below the area of your injury that does not improve after you remove the elastic bandage.   This information is not intended to replace advice given to you by your health care provider. Make sure you discuss any questions you have with your health care provider.   Document Released: 04/09/2000 Document Revised: 09/16/2014 Document Reviewed: 12/03/2013 Elsevier Interactive Patient Education 2016 Bone Gap Syndrome Carpal tunnel syndrome is a condition that causes pain in your hand and arm. The carpal tunnel is a narrow area located on the palm side of your wrist. Repeated wrist motion or certain diseases may cause swelling within the tunnel. This swelling pinches the main nerve in the wrist (median nerve). CAUSES  This condition may be caused by:   Repeated wrist motions.  Wrist injuries.  Arthritis.  A cyst or tumor in the carpal tunnel.  Fluid buildup  during pregnancy. Sometimes the cause of this condition is not known.  RISK FACTORS This condition is more likely to develop in:   People who have jobs that cause them to repeatedly move their wrists in the same motion, such as butchers and cashiers.  Women.  People with certain conditions, such  as:  Diabetes.  Obesity.  An underactive thyroid (hypothyroidism).  Kidney failure. SYMPTOMS  Symptoms of this condition include:   A tingling feeling in your fingers, especially in your thumb, index, and middle fingers.  Tingling or numbness in your hand.  An aching feeling in your entire arm, especially when your wrist and elbow are bent for long periods of time.  Wrist pain that goes up your arm to your shoulder.  Pain that goes down into your palm or fingers.  A weak feeling in your hands. You may have trouble grabbing and holding items. Your symptoms may feel worse during the night.  DIAGNOSIS  This condition is diagnosed with a medical history and physical exam. You may also have tests, including:   An electromyogram (EMG). This test measures electrical signals sent by your nerves into the muscles.  X-rays. TREATMENT  Treatment for this condition includes:  Lifestyle changes. It is important to stop doing or modify the activity that caused your condition.  Physical or occupational therapy.  Medicines for pain and inflammation. This may include medicine that is injected into your wrist.  A wrist splint.  Surgery. HOME CARE INSTRUCTIONS  If You Have a Splint:  Wear it as told by your health care provider. Remove it only as told by your health care provider.  Loosen the splint if your fingers become numb and tingle, or if they turn cold and blue.  Keep the splint clean and dry. General Instructions  Take over-the-counter and prescription medicines only as told by your health care provider.  Rest your wrist from any activity that may be causing your pain. If your condition is work related, talk to your employer about changes that can be made, such as getting a wrist pad to use while typing.  If directed, apply ice to the painful area:  Put ice in a plastic bag.  Place a towel between your skin and the bag.  Leave the ice on for 20 minutes, 2-3 times per  day.  Keep all follow-up visits as told by your health care provider. This is important.  Do any exercises as told by your health care provider, physical therapist, or occupational therapist. [place] IF:   You have new symptoms.  Your pain is not controlled with medicines.  Your symptoms get worse.   This information is not intended to replace advice given to you by your health care provider. Make sure you discuss any questions you have with your health care provider.   Document Released: 12/24/1999 Document Revised: 09/16/2014 Document Reviewed: 05/13/2014 Elsevier Interactive Patient Education [employer].

## 2014-11-05 NOTE — ED Notes (Signed)
Patient is alert and orientedx4.  Patient was explained discharge instructions and they understood them with no questions.   

## 2014-11-06 ENCOUNTER — Telehealth: Payer: Self-pay | Admitting: Family Medicine

## 2014-11-06 DIAGNOSIS — M79641 Pain in right hand: Secondary | ICD-10-CM

## 2014-11-06 DIAGNOSIS — M7989 Other specified soft tissue disorders: Secondary | ICD-10-CM

## 2014-11-06 DIAGNOSIS — M79642 Pain in left hand: Secondary | ICD-10-CM

## 2014-11-06 NOTE — Telephone Encounter (Signed)
Will forward to MD. Dominique [name],CMA  

## 2014-11-06 NOTE — Telephone Encounter (Signed)
Pt went to ED with hand/arm problem,. Was told it was carpal tunnel but doesn't seem to be better.  Wants to talk to dr Berkley Harvey because there hasnt been any testing done Please call pt

## 2014-11-09 NOTE — Telephone Encounter (Signed)
Pt is calling back to see when the doctor is going to call her. jw

## 2014-11-09 NOTE — Telephone Encounter (Signed)
Will forward to MD. Aemilia Dedrick,CMA  

## 2014-11-11 ENCOUNTER — Telehealth: Payer: Self-pay | Admitting: Family Medicine

## 2014-11-11 DIAGNOSIS — M79642 Pain in left hand: Secondary | ICD-10-CM

## 2014-11-11 DIAGNOSIS — M79641 Pain in right hand: Secondary | ICD-10-CM | POA: Insufficient documentation

## 2014-11-11 NOTE — Telephone Encounter (Signed)
Pt calling back once more about this request. [name], ASA

## 2014-11-11 NOTE — Telephone Encounter (Signed)
addending separate message. Closing this encounter. Dominique [name], ASA

## 2014-11-12 ENCOUNTER — Ambulatory Visit (INDEPENDENT_AMBULATORY_CARE_PROVIDER_SITE_OTHER): Payer: No Typology Code available for payment source | Admitting: Family Medicine

## 2014-11-12 ENCOUNTER — Encounter: Payer: Self-pay | Admitting: Family Medicine

## 2014-11-12 VITALS — BP 104/72 | HR 91 | Ht <= 58 in | Wt 200.0 lb

## 2014-11-12 DIAGNOSIS — M79642 Pain in left hand: Secondary | ICD-10-CM

## 2014-11-12 DIAGNOSIS — M79641 Pain in right hand: Secondary | ICD-10-CM

## 2014-11-12 MED ORDER — PREDNISONE 10 MG PO TABS
ORAL_TABLET | ORAL | Status: DC
Start: 2014-11-12 — End: 2015-08-31

## 2014-11-12 NOTE — Patient Instructions (Signed)
Your symptoms are consistent with a peripheral neuropathy, possibly an unusual presentation of carpal tunnel syndrome. Wear the wrist braces at nighttime and as often as possible during the day If you do not like the ones we or the ED have, try to get ones over the counter that are more comfortable but have the metal stay in the palm area. Prednisone dose pack as directed for 6 days. If not improving, will consider nerve conduction studies to assess severity. Follow up with me in 1 month but call me in 1-2 weeks to let me know how you're doing.

## 2014-11-17 NOTE — Assessment & Plan Note (Signed)
with intermittent swelling.  Distribution suggests a neuropathy though exam is benign and location of pain is unusual for something like carpal tunnel syndrome.  Possible she has an unusual presentation of this.  Discussed options - will start with wrist braces at night and as often as possible during the day, prednisone dose pack.  If still not improving would consider nerve conduction studies of bilateral upper extremities.  F/u in 1 month.

## 2014-11-17 NOTE — Progress Notes (Signed)
PCP and consultation requested by: Phill Myron, MD  Subjective:   HPI: Patient is a 39 y.o. female here for bilateral hand pain.  Patient reports for about 2-3 months she's had bilateral hand pain and swelling. Initially started in right side, greater than left. Pain now 5/10 on right, 3/10 on left. Feels pain on dorsal aspect of 3rd digits and goes up arms to her shoulders. Worse at night. Tried topical medications without much benefit. Pain in sharp, associated with some tingling. Initially was thought to have Raynaud's disease. No skin changes, fever, other complaints.  Past Medical History  Diagnosis Date  . Raynaud's disease   . DVT (deep venous thrombosis) ([place])   . Raynaud disease 12/21/2011  . Varicose veins   . GERD (gastroesophageal reflux disease)   . History of recurrent UTIs     Current Outpatient Prescriptions on File Prior to Visit  Medication Sig Dispense Refill  . acetaminophen (TYLENOL) 325 MG tablet Take 650 mg by mouth every 6 (six) hours as needed for moderate pain.    Marland Kitchen amitriptyline (ELAVIL) 10 MG tablet TAKE TWO AND ONE-HALF TABLET BY MOUTH AT BEDTIME 90 tablet 0  . Diclofenac Sodium 2 % SOLN Place 1 application onto the skin 2 (two) times daily. FOR 60 TO 90 DAYS 112 g 1  . Ferrous Sulfate (IRON) 325 (65 FE) MG TABS Take 325 mg by mouth daily. 30 each 5  . hydrocortisone 2.5 % ointment Apply topically 2 (two) times daily. 453 g 0  . hydrOXYzine (VISTARIL) 25 MG capsule Take 1 capsule (25 mg total) by mouth every 6 (six) hours as needed. 30 capsule 0  . hyoscyamine (LEVSIN SL) 0.125 MG SL tablet Take 0.125 mg by mouth every 6 (six) hours as needed for cramping.     . Multiple Vitamin (MULTIVITAMIN WITH MINERALS) TABS Take 1 tablet by mouth daily.    . mupirocin ointment (BACTROBAN) 2 % Apply to both nostrils TID for 1 month. 22 g 1  . omeprazole (PRILOSEC) 40 MG capsule TAKE ONE CAPSULE BY MOUTH TWICE DAILY BEFORE A MEAL 60 capsule 3  . valACYclovir  (VALTREX) 1000 MG tablet Take 0.5 tablets (500 mg total) by mouth 2 (two) times daily. 7 tablet 5   No current facility-administered medications on file prior to visit.    Past Surgical History  Procedure Laterality Date  . Abdominal hysterectomy    . Cesarean section    . Appendectomy    . Hernia repair  2006  . Endovenous ablation saphenous vein w/ laser  02-01-2012    right greater saphenous vein and stab phlebectomy 10-20 incisions right leg  by Curt Jews, MD  . Endovenous ablation saphenous vein w/ laser Left 02-29-2012    left greater saphenous vein and stab phlebectomy left leg 10-20 incisions  by Curt Jews MD  . Esophagogastroduodenoscopy (egd) with propofol N/A 03/26/2012    Procedure: ESOPHAGOGASTRODUODENOSCOPY (EGD) WITH PROPOFOL;  Surgeon: Cleotis Nipper, MD;  Location: [facility];  Service: Endoscopy;  Laterality: N/A;  . Colonoscopy with propofol N/A 03/26/2012    Procedure: COLONOSCOPY WITH PROPOFOL;  Surgeon: Cleotis Nipper, MD;  Location: [facility];  Service: Endoscopy;  Laterality: N/A;  . Endovenous ablation saphenous vein w/ laser Left   . Esophagogastroduodenoscopy (egd) with propofol N/A 04/02/2014    Procedure: ESOPHAGOGASTRODUODENOSCOPY (EGD) WITH PROPOFOL;  Surgeon: Ronald Lobo, MD;  Location: [facility];  Service: Endoscopy;  Laterality: N/A;  . Balloon dilation N/A 04/02/2014    Procedure:  BALLOON DILATION;  Surgeon: Ronald Lobo, MD;  Location: [facility];  Service: Endoscopy;  Laterality: N/A;    Allergies  Allergen Reactions  . Contrast Media [Iodinated Diagnostic Agents] Anaphylaxis and Swelling    Tongue swelling  . Latex Anaphylaxis  . Zofran [Ondansetron Hcl] Other (See Comments)    Hives, numbness of legs with previous use  . Doxycycline     MADE LEGS HURT  . Eggs Or Egg-Derived Products Nausea And Vomiting  . Lactose Intolerance (Gi) Diarrhea  . Nsaids     Worsened gastritis; 02/22/14 GI bleed    Social History   Social  History  . Marital Status: Divorced    Spouse Name: N/A  . Number of Children: N/A  . Years of Education: N/A   Occupational History  . Not on file.   Social History Main Topics  . Smoking status: Never Smoker   . Smokeless tobacco: Never Used  . Alcohol Use: No  . Drug Use: No     Comment: former narcotic presription abuse  . Sexual Activity: Not on file   Other Topics Concern  . Not on file   Social History Narrative    Family History  Problem Relation Age of Onset  . Diabetes Mother   . Heart disease Mother   . Hyperlipidemia Mother   . Hypertension Mother   . Other Mother     amputation, varicose veins  . Diabetes Father   . Hyperlipidemia Father   . Hypertension Father   . Peripheral vascular disease Father   . Other Father     varicose veins  . Deep vein thrombosis Brother   . Other Brother     varicose veins    BP 104/72 mmHg  Pulse 91  Ht 4\' 7"  (1.397 m)  Wt 200 lb (90.719 kg)  BMI 46.48 kg/m2  Review of Systems: See HPI above.    Objective:  Physical Exam:  Gen: NAD  Right hand/wrist: No gross deformity, swelling, bruising apparent. Mild TTP dorsal 3rd digit into hand, wrist.  No other tenderness. FROM wrist, elbow, digits without pain. Tinels negative at carpal tunnel, cubital tunnel, radial tunnel. Negative phalens. NVI distally.  Left hand/wrist: No gross deformity, swelling, bruising apparent. Mild TTP dorsal 3rd digit into hand, wrist.  No other tenderness. FROM wrist, elbow, digits without pain. Tinels negative at carpal tunnel, cubital tunnel, radial tunnel. Negative phalens. NVI distally.    Assessment & Plan:  1. Bilateral arm pain - with intermittent swelling.  Distribution suggests a neuropathy though exam is benign and location of pain is unusual for something like carpal tunnel syndrome.  Possible she has an unusual presentation of this.  Discussed options - will start with wrist braces at night and as often as possible  during the day, prednisone dose pack.  If still not improving would consider nerve conduction studies of bilateral upper extremities.  F/u in 1 month.

## 2014-11-23 ENCOUNTER — Telehealth: Payer: Self-pay | Admitting: Family Medicine

## 2014-11-23 NOTE — Telephone Encounter (Signed)
Pt called and needs a refill on her Phenergan. jw °

## 2014-11-30 ENCOUNTER — Other Ambulatory Visit: Payer: Self-pay | Admitting: Family Medicine

## 2014-11-30 MED ORDER — PROMETHAZINE HCL 12.5 MG PO TABS: 12.5000 mg | ORAL_TABLET | Freq: Four times a day (QID) | ORAL | Status: DC | PRN

## 2014-12-01 ENCOUNTER — Encounter: Payer: Self-pay | Admitting: Family Medicine

## 2014-12-01 ENCOUNTER — Ambulatory Visit (INDEPENDENT_AMBULATORY_CARE_PROVIDER_SITE_OTHER): Payer: No Typology Code available for payment source | Admitting: Family Medicine

## 2014-12-01 VITALS — BP 124/80 | HR 99 | Temp 98.4°F | Ht <= 58 in | Wt 202.0 lb

## 2014-12-01 DIAGNOSIS — M79642 Pain in left hand: Secondary | ICD-10-CM

## 2014-12-01 DIAGNOSIS — M79641 Pain in right hand: Secondary | ICD-10-CM

## 2014-12-01 DIAGNOSIS — M797 Fibromyalgia: Secondary | ICD-10-CM | POA: Diagnosis not present

## 2014-12-01 MED ORDER — VALACYCLOVIR HCL 1 G PO TABS: 500.0000 mg | ORAL_TABLET | Freq: Two times a day (BID) | ORAL | Status: DC

## 2014-12-01 MED ORDER — TRAMADOL HCL 50 MG PO TABS: 50.0000 mg | ORAL_TABLET | Freq: Three times a day (TID) | ORAL | Status: DC | PRN

## 2014-12-01 MED ORDER — AMITRIPTYLINE HCL 25 MG PO TABS: 25.0000 mg | ORAL_TABLET | Freq: Every day | ORAL | Status: DC

## 2014-12-01 MED ORDER — MELOXICAM 15 MG PO TABS: 15.0000 mg | ORAL_TABLET | Freq: Every day | ORAL | Status: DC

## 2014-12-01 MED ORDER — OMEPRAZOLE 40 MG PO CPDR: DELAYED_RELEASE_CAPSULE | ORAL | Status: DC

## 2014-12-01 NOTE — Assessment & Plan Note (Signed)
She continues to take Elavil 15 mg daily at bedtime.  - Increase Elavil 25 mg daily at bedtime; advised increasing to 50 mg daily at bedtime after 1-2 weeks.  Asked her to call to verify that she had increased to 50 mg in 2 weeks.  Like to continue to increase this to 100-150 mg as tolerated - Long discussion about gabapentin and Lyrica today.  Consider starting Lyrica after Elavil maximized as she is concerned about fatigue and sleepiness associated with gabapentin.  - Continue to consider SNRI - Recommended referral to psychologist.  However, she declines at this time and prefers to continue to confide in her rehab sponsor - Consider referral to pain clinic

## 2014-12-01 NOTE — Patient Instructions (Signed)
It was great seeing you today.  I think your arm pain is related to her fibromyalgia and we should work on optimizing your treatment for this.   1. Increase Elavil to 25 mg at night.  After 1-2 weeks, increase to 50 mg at night.  2. Call me when you have increased to 50 mg at night.  3. Continue with mobility 50 mg every morning.  Continue taking Prilosec.  4. Consider talking with a psychologist or counselor as this can be very important for the treatment of fibromyalgia.  5. I encourage you to continue to work on increasing her exercise until you're able to walk for 30 to 60 minutes a day.    Please bring all your medications to every doctors visit  Sign up for My Chart to have easy access to your labs results, and communication with your Primary care physician.  Next Appointment  Please make an appointment with Dr Berkley Harvey in 1 month   I look forward to talking with you again at our next visit. If you have any questions or concerns before then, please call the clinic at [phone].  Take Care,   Dr Phill Myron

## 2014-12-01 NOTE — Progress Notes (Signed)
  Patient name: Dominique [name] MRN [id number]  Date of birth: 03-17-1975  CC & HPI:  Dominique [name] is a 39 y.o. female presenting today for bilateral hand and arm pain.  She reports hand and wrist pain that is achy in nature.  This is different from her previous hand erythema and burning.  She reports pain is constant but worse at night and often wakes her from sleep.  Pain in wrist and all fingers and radiates up her entire arm to shoulder.  Right arm worse than left.  She was evaluated by sports medicine and given prednisone and wrist splints for possible carpal tunnel, but she denies any improvement with this.  She endorses worsening mood and stress as things given approaches as this was the last time she saw her father.  She continues to endorse bilateral leg aching and pain.  Takes 3-4 Ultram daily with improvement in pain, which allows her to continue to work.  She follows closely with her rehabilitation sponsor.  She is no longer doing physical therapy for her leg pain.   ROS: See HPI   Medical & Surgical Hx:  Reviewed  Medications & Allergies: Reviewed  Social History: Reviewed:   Objective Findings:  Vitals: BP 124/80 mmHg  Pulse 99  Temp(Src) 98.4 F (36.9 C) (Oral)  Ht 4\' 7"  (1.397 m)  Wt 202 lb (91.627 kg)  BMI 46.95 kg/m2  SpO2 99%  Gen: NAD CV: RRR w/o m/r/g, pulses +2 b/l Resp: CTAB w/ normal respiratory effort Hands: Bilateral hand and wrist splotchy erythema without warmth or swelling.  No synovial bogginess appreciated.  Full range of motion in wrist and fingers.  Strength 5 out of 5.  Radial pulses 2+ bilaterally.  Sensation intact bilaterally.   Assessment & Plan:   Bilateral hand pain Bilateral hand and wrist pain with some radiation to shoulders.  Pain not consistent with any particular nerve distribution or trauma.  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  No  concerning red flags for cervical radiculopathy at this time; however, discussed cervical x-rays which she declines currently.  - Discussed this is most likely related to her fibromyalgia and we should continue optimizing treatment for this.  See fibromyalgia assessment and plan - Consider cervical x-rays, if pain persist - Consider referral to PM&R for possible nerve conduction studies  Fibromyalgia She continues to take Elavil 15 mg daily at bedtime.  - Increase Elavil 25 mg daily at bedtime; advised increasing to 50 mg daily at bedtime after 1-2 weeks.  Asked her to call to verify that she had increased to 50 mg in 2 weeks.  Like to continue to increase this to 100-150 mg as tolerated - Long discussion about gabapentin and Lyrica today.  Consider starting Lyrica after Elavil maximized as she is concerned about fatigue and sleepiness associated with gabapentin.  - Continue to consider SNRI - Recommended referral to psychologist.  However, she declines at this time and prefers to continue to confide in her rehab sponsor - Consider referral to pain clinic

## 2014-12-01 NOTE — Assessment & Plan Note (Signed)
Bilateral hand and wrist pain with some radiation to shoulders.  Pain not consistent with any particular nerve distribution or trauma.  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  No concerning red flags for cervical radiculopathy at this time; however, discussed cervical x-rays which she declines currently.  - Discussed this is most likely related to her fibromyalgia and we should continue optimizing treatment for this.  See fibromyalgia assessment and plan - Consider cervical x-rays, if pain persist - Consider referral to PM&R for possible nerve conduction studies

## 2014-12-07 ENCOUNTER — Telehealth: Payer: Self-pay | Admitting: Family Medicine

## 2014-12-07 NOTE — Telephone Encounter (Signed)
Will forward to PCP to make him aware. Lexany Belknap, CMA.

## 2014-12-07 NOTE — Telephone Encounter (Signed)
Pt called and wanted the doctor to know that the increase in her Elavil has helped a little. She has been able to get some sleep. jw

## 2014-12-10 ENCOUNTER — Ambulatory Visit: Payer: No Typology Code available for payment source | Admitting: Family Medicine

## 2014-12-13 ENCOUNTER — Encounter (HOSPITAL_COMMUNITY): Payer: Self-pay | Admitting: *Deleted

## 2014-12-13 ENCOUNTER — Emergency Department (INDEPENDENT_AMBULATORY_CARE_PROVIDER_SITE_OTHER)
Admission: EM | Admit: 2014-12-13 | Discharge: 2014-12-13 | Disposition: A | Discharge status: Home / Self Care | Payer: No Typology Code available for payment source | Source: Home / Self Care | Attending: Emergency Medicine | Admitting: Emergency Medicine

## 2014-12-13 DIAGNOSIS — J069 Acute upper respiratory infection, unspecified: Secondary | ICD-10-CM

## 2014-12-13 MED ORDER — FLUTICASONE PROPIONATE 50 MCG/ACT NA SUSP: 2.0000 | Freq: Every day | NASAL | Status: DC

## 2014-12-13 MED ORDER — IPRATROPIUM BROMIDE 0.06 % NA SOLN: 2.0000 | Freq: Four times a day (QID) | NASAL | Status: DC

## 2014-12-13 NOTE — ED Notes (Signed)
Pt  Reports   Symptoms  Of    Cough   /         Headache   Congested  And  Stuffy  Nose   Symptoms      Started  About  5   Days  Ago                  Pt    Reports    She  Has  Tried     otc   Treatment          That has  Not releived  All  The  Symptoms      She   reprts  The  Cough  Is  Non  Productive

## 2014-12-13 NOTE — ED Provider Notes (Signed)
CSN: [id number]     Arrival date & time 12/13/14  1301 History   First MD Initiated Contact with Patient 12/13/14 1330     Chief Complaint  Patient presents with  . URI   (Consider location/radiation/quality/duration/timing/severity/associated sxs/prior Treatment) HPI  She is a 39 year old woman here for evaluation of throat pain. She states her symptoms started about 5 days ago with sinus pressure, sore throat, and postnasal drainage. She also reports some body aches. She states her symptoms have gradually been getting worse. She denies any nasal congestion or rhinorrhea. No ear pain. No fevers or chills. No nausea, vomiting, or diarrhea. She reports a mild cough that started yesterday. It is nonproductive. She denies any shortness of breath. She has been taking Zyrtec with out improvement.  Past Medical History  Diagnosis Date  . Raynaud's disease   . DVT (deep venous thrombosis) ([place])   . Raynaud disease 12/21/2011  . Varicose veins   . GERD (gastroesophageal reflux disease)   . History of recurrent UTIs    Past Surgical History  Procedure Laterality Date  . Abdominal hysterectomy    . Cesarean section    . Appendectomy    . Hernia repair  2006  . Endovenous ablation saphenous vein w/ laser  02-01-2012    right greater saphenous vein and stab phlebectomy 10-20 incisions right leg  by Curt Jews, MD  . Endovenous ablation saphenous vein w/ laser Left 02-29-2012    left greater saphenous vein and stab phlebectomy left leg 10-20 incisions  by Curt Jews MD  . Esophagogastroduodenoscopy (egd) with propofol N/A 03/26/2012    Procedure: ESOPHAGOGASTRODUODENOSCOPY (EGD) WITH PROPOFOL;  Surgeon: Cleotis Nipper, MD;  Location: [facility];  Service: Endoscopy;  Laterality: N/A;  . Colonoscopy with propofol N/A 03/26/2012    Procedure: COLONOSCOPY WITH PROPOFOL;  Surgeon: Cleotis Nipper, MD;  Location: [facility];  Service: Endoscopy;  Laterality: N/A;  . Endovenous ablation saphenous  vein w/ laser Left   . Esophagogastroduodenoscopy (egd) with propofol N/A 04/02/2014    Procedure: ESOPHAGOGASTRODUODENOSCOPY (EGD) WITH PROPOFOL;  Surgeon: Ronald Lobo, MD;  Location: [facility];  Service: Endoscopy;  Laterality: N/A;  . Balloon dilation N/A 04/02/2014    Procedure: BALLOON DILATION;  Surgeon: Ronald Lobo, MD;  Location: [facility];  Service: Endoscopy;  Laterality: N/A;   Family History  Problem Relation Age of Onset  . Diabetes Mother   . Heart disease Mother   . Hyperlipidemia Mother   . Hypertension Mother   . Other Mother     amputation, varicose veins  . Diabetes Father   . Hyperlipidemia Father   . Hypertension Father   . Peripheral vascular disease Father   . Other Father     varicose veins  . Deep vein thrombosis Brother   . Other Brother     varicose veins   Social History  Substance Use Topics  . Smoking status: Never Smoker   . Smokeless tobacco: Never Used  . Alcohol Use: No   OB History    No data available     Review of Systems As in history of present illness Allergies  Contrast media; Latex; Zofran; Doxycycline; Eggs or egg-derived products; Lactose intolerance (gi); and Nsaids  Home Medications   Prior to Admission medications   Medication Sig Start Date End Date Taking? Authorizing Provider  acetaminophen (TYLENOL) 325 MG tablet Take 650 mg by mouth every 6 (six) hours as needed for moderate pain.    Historical Provider, MD  amitriptyline (ELAVIL) 25 MG tablet Take 1 tablet (25 mg total) by mouth at bedtime. 12/01/14   Olam Idler, MD  Diclofenac Sodium 2 % SOLN Place 1 application onto the skin 2 (two) times daily. FOR 60 TO 90 DAYS 11/05/14   [name], PA-C  Ferrous Sulfate (IRON) 325 (65 FE) MG TABS Take 325 mg by mouth daily. 03/09/14   Olam Idler, MD  fluticasone (FLONASE) 50 MCG/ACT nasal spray Place 2 sprays into both nostrils daily. 12/13/14   Melony Overly, MD  hydrocortisone 2.5 % ointment Apply topically  2 (two) times daily. 11/04/14   Elberta Leatherwood, MD  hydrOXYzine (VISTARIL) 25 MG capsule Take 1 capsule (25 mg total) by mouth every 6 (six) hours as needed. 07/02/14   Olam Idler, MD  hyoscyamine (LEVSIN SL) 0.125 MG SL tablet Take 0.125 mg by mouth every 6 (six) hours as needed for cramping.  02/19/14   Historical Provider, MD  ipratropium (ATROVENT) 0.06 % nasal spray Place 2 sprays into both nostrils 4 (four) times daily. 12/13/14   Melony Overly, MD  meloxicam (MOBIC) 15 MG tablet Take 1 tablet (15 mg total) by mouth daily. 12/01/14   Olam Idler, MD  Multiple Vitamin (MULTIVITAMIN WITH MINERALS) TABS Take 1 tablet by mouth daily.    Historical Provider, MD  mupirocin ointment (BACTROBAN) 2 % Apply to both nostrils TID for 1 month. 02/07/14   Harden Mo, MD  omeprazole (PRILOSEC) 40 MG capsule TAKE ONE CAPSULE BY MOUTH TWICE DAILY BEFORE A MEAL 12/01/14   Olam Idler, MD  predniSONE (DELTASONE) 10 MG tablet 6 tabs po day 1, 5 tabs po day 2, 4 tabs po day 3, 3 tabs po day 4, 2 tabs po day 5, 1 tab po day 6 11/12/14   Dene Gentry, MD  promethazine (PHENERGAN) 12.5 MG tablet Take 1 tablet (12.5 mg total) by mouth every 6 (six) hours as needed for nausea or vomiting. 11/30/14   Olam Idler, MD  traMADol (ULTRAM) 50 MG tablet Take 1 tablet (50 mg total) by mouth every 8 (eight) hours as needed. 12/01/14   Olam Idler, MD  valACYclovir (VALTREX) 1000 MG tablet Take 0.5 tablets (500 mg total) by mouth 2 (two) times daily. 12/01/14   Olam Idler, MD   Meds Ordered and Administered this Visit  Medications - No data to display  BP 103/74 mmHg  Pulse 93  Temp(Src) 98.3 F (36.8 C) (Oral)  SpO2 100% No data found.   Physical Exam  Constitutional: She is oriented to person, place, and time. She appears well-developed and well-nourished. No distress.  HENT:  Mouth/Throat: No oropharyngeal exudate.  Difficult to visualize oropharynx is due to her tongue. What I can visualize is  normal. TMs normal bilaterally. Nasal mucosa is normal. No sinus tenderness.  Eyes: Conjunctivae are normal.  Neck: Neck supple.  Cardiovascular: Normal rate, regular rhythm and normal heart sounds.   No murmur heard. Pulmonary/Chest: Effort normal and breath sounds normal. No respiratory distress. She has no wheezes. She has no rales.  Lymphadenopathy:    She has no cervical adenopathy.  Neurological: She is alert and oriented to person, place, and time.    ED Course  Procedures (including critical care time)  Labs Review Labs Reviewed - No data to display  Imaging Review No results found.    MDM   1. Viral URI    Symptoms are most consistent with a  viral syndrome. Will add Flonase and Atrovent nasal spray to the Zyrtec. Return precautions reviewed. Expect improvement over the next 3-5 days.    Melony Overly, MD 12/13/14 1356

## 2014-12-13 NOTE — Discharge Instructions (Signed)
Your symptoms are running from a virus and inflammation. Please continue the Zyrtec. Use Flonase once a day for the next week. Use the Atrovent nasal spray 4 times a day for the next week. You can take Tylenol or ibuprofen as needed for headache or body aches. You should see improvement over the next 3-4 days. If you develop fevers, worsening cough, shortness of breath, please come here or see your primary care doctor.

## 2014-12-15 ENCOUNTER — Telehealth: Payer: Self-pay | Admitting: Family Medicine

## 2014-12-15 NOTE — Telephone Encounter (Signed)
Will forward to PCP for review of this request and to see if pt will still need to be seen here in clinic for this. [place], CMA.

## 2014-12-15 NOTE — Telephone Encounter (Signed)
Antibiotics don't treat viruses. If she wants a second opinion or she feels she is getting worse, she can make a sameday appointment.

## 2014-12-15 NOTE — Telephone Encounter (Signed)
[facility]. Was diagnosised  with a virus--diarrhea, headaches. Cough up green stuff  Her job wants her to have an antibotic. Please advise

## 2014-12-16 ENCOUNTER — Ambulatory Visit (INDEPENDENT_AMBULATORY_CARE_PROVIDER_SITE_OTHER): Payer: No Typology Code available for payment source | Admitting: Family Medicine

## 2014-12-16 ENCOUNTER — Encounter: Payer: Self-pay | Admitting: Family Medicine

## 2014-12-16 VITALS — BP 121/75 | HR 88 | Temp 98.8°F | Wt 205.2 lb

## 2014-12-16 DIAGNOSIS — J069 Acute upper respiratory infection, unspecified: Secondary | ICD-10-CM | POA: Diagnosis not present

## 2014-12-16 MED ORDER — AMOXICILLIN 500 MG PO CAPS: 500.0000 mg | ORAL_CAPSULE | Freq: Three times a day (TID) | ORAL | Status: DC

## 2014-12-16 MED ORDER — GUAIFENESIN 200 MG PO TABS: 200.0000 mg | ORAL_TABLET | ORAL | Status: DC | PRN

## 2014-12-16 MED ORDER — PROMETHAZINE HCL 12.5 MG PO TABS: 12.5000 mg | ORAL_TABLET | Freq: Four times a day (QID) | ORAL | Status: DC | PRN

## 2014-12-16 NOTE — Patient Instructions (Signed)
I have prescribed an antibiotic that you should take every 8 hours until you complete the course  Use lozenges and chloraseptic spray for sore throat I have prescribed Mucinex for phlegm and chest congestion.  Upper Respiratory Infection, Adult Most upper respiratory infections (URIs) are a viral infection of the air passages leading to the lungs. A URI affects the nose, throat, and upper air passages. The most common type of URI is nasopharyngitis and is typically referred to as "the common cold." URIs run their course and usually go away on their own. Most of the time, a URI does not require medical attention, but sometimes a bacterial infection in the upper airways can follow a viral infection. This is called a secondary infection. Sinus and middle ear infections are common types of secondary upper respiratory infections. Bacterial pneumonia can also complicate a URI. A URI can worsen asthma and chronic obstructive pulmonary disease (COPD). Sometimes, these complications can require emergency medical care and may be life threatening.  CAUSES Almost all URIs are caused by viruses. A virus is a type of germ and can spread from one person to another.  RISKS FACTORS You may be at risk for a URI if:   You smoke.   You have chronic heart or lung disease.  You have a weakened defense (immune) system.   You are very young or very old.   You have nasal allergies or asthma.  You work in crowded or poorly ventilated areas.  You work in health care facilities or schools. SIGNS AND SYMPTOMS  Symptoms typically develop 2-3 days after you come in contact with a cold virus. Most viral URIs last 7-10 days. However, viral URIs from the influenza virus (flu virus) can last 14-18 days and are typically more severe. Symptoms may include:   Runny or stuffy (congested) nose.   Sneezing.   Cough.   Sore throat.   Headache.   Fatigue.   Fever.   Loss of appetite.   Pain in your  forehead, behind your eyes, and over your cheekbones (sinus pain).  Muscle aches.  DIAGNOSIS  Your health care provider may diagnose a URI by:  Physical exam.  Tests to check that your symptoms are not due to another condition such as:  Strep throat.  Sinusitis.  Pneumonia.  Asthma. TREATMENT  A URI goes away on its own with time. It cannot be cured with medicines, but medicines may be prescribed or recommended to relieve symptoms. Medicines may help:  Reduce your fever.  Reduce your cough.  Relieve nasal congestion. HOME CARE INSTRUCTIONS   Take medicines only as directed by your health care provider.   Gargle warm saltwater or take cough drops to comfort your throat as directed by your health care provider.  Use a warm mist humidifier or inhale steam from a shower to increase air moisture. This may make it easier to breathe.  Drink enough fluid to keep your urine clear or pale yellow.   Eat soups and other clear broths and maintain good nutrition.   Rest as needed.   Return to work when your temperature has returned to normal or as your health care provider advises. You may need to stay home longer to avoid infecting others. You can also use a face mask and careful hand washing to prevent spread of the virus.  Increase the usage of your inhaler if you have asthma.   Do not use any tobacco products, including cigarettes, chewing tobacco, or electronic cigarettes. If you  need help quitting, ask your health care provider. PREVENTION  The best way to protect yourself from getting a cold is to practice good hygiene.   Avoid oral or hand contact with people with cold symptoms.   Wash your hands often if contact occurs.  There is no clear evidence that vitamin C, vitamin E, echinacea, or exercise reduces the chance of developing a cold. However, it is always recommended to get plenty of rest, exercise, and practice good nutrition.  SEEK MEDICAL CARE IF:   You  are getting worse rather than better.   Your symptoms are not controlled by medicine.   You have chills.  You have worsening shortness of breath.  You have brown or red mucus.  You have yellow or brown nasal discharge.  You have pain in your face, especially when you bend forward.  You have a fever.  You have swollen neck glands.  You have pain while swallowing.  You have white areas in the back of your throat. SEEK IMMEDIATE MEDICAL CARE IF:   You have severe or persistent:  Headache.  Ear pain.  Sinus pain.  Chest pain.  You have chronic lung disease and any of the following:  Wheezing.  Prolonged cough.  Coughing up blood.  A change in your usual mucus.  You have a stiff neck.  You have changes in your:  Vision.  Hearing.  Thinking.  Mood. MAKE SURE YOU:   Understand these instructions.  Will watch your condition.  Will get help right away if you are not doing well or get worse.   This information is not intended to replace advice given to you by your health care provider. Make sure you discuss any questions you have with your health care provider.   Document Released: 06/21/2000 Document Revised: 05/12/2014 Document Reviewed: 04/02/2013 Elsevier Interactive Patient Education [employer].

## 2014-12-16 NOTE — Assessment & Plan Note (Signed)
Very complicated patient presenting with a plethora of URI type symptoms that have been present for over 2 weeks. It is difficult to tell if she had a double sickening concerning for an initial viral infection with a superimposed bacterial infection. Given the duration of symptoms that have continued to worsen, will treat as a bacterial infection. -Rx for amoxicillin 500mg  q8hrs  -Guaifenesin for phlegm/congestion  -Discussed RTC precautions- worsening symptoms, new symptoms, inability to take PO or fluids.

## 2014-12-16 NOTE — Progress Notes (Signed)
Patient ID: Dominique [name], female   DOB: 11/19/1975, 39 y.o.   MRN: [id number]    Subjective: CC: chest congestion HPI: Patient is a 39 y.o. female with a past medical history of fibromyalgia presenting to clinic today for a same day appt for concerns of chest congestion/myalgias. She states "all I taste is infection" noting that she's had infections in the past with numerous infections.   Patient noted that 2-3 weeks she started have epistaxis. Then over 2 weeks ago she started having weakness, body aches, and was sick on her stomach. The GI discomfort has resolved. 1 week ago she started having nasal congestion/facial pressure, chest congestion, nasal drainage, and sore throat.   She notes pain with coughing, however the patient is able to control coughing with cough drops. She is having subjective fevers, but has not taken her temperature. She thought she was feeling better after working a 9 hour shift, but notes she then started feeling worse.  On ROS no sneezing, otalgias, SOB, chest pain, DOE, wheezing.   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   Social History: never smoker but her neighbor smokes and she can smell it.   Health Maintenance: UTD on flu vaccine  ROS: All other systems reviewed and are negative.  Past Medical History Patient Active Problem List   Diagnosis Date Noted  . Acute upper respiratory infection 12/16/2014  . Bilateral hand pain 11/11/2014  . Dysuria 08/14/2014  . Urinary incontinence 08/14/2014  . Hematuria 08/14/2014  . Right thigh pain 08/14/2014  . UTI (urinary tract infection) 07/29/2014  . Dermatitis 07/02/2014  . Anemia, iron deficiency 03/09/2014  . Personal history of DVT (deep vein thrombosis)   . Skin lesion of breast 01/13/2014  . Fibromyalgia 12/11/2013  . Hallucinations 12/11/2013  . Total body pain 07/18/2013  . Internal hemorrhoid, bleeding 03/27/2013  . Hand swelling 12/27/2012  . 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  Cystitis, chronic 10/15/2012  . Leg swelling 06/18/2012  . Gastritis 04/02/2012  . Obesity (BMI 30-39.9) 02/05/2012  . Varicose veins of lower extremities with other complications 0000000    Medications- reviewed and updated Current Outpatient Prescriptions  Medication Sig Dispense Refill  . acetaminophen (TYLENOL) 325 MG tablet Take 650 mg by mouth every 6 (six) hours as needed for moderate pain.    Marland Kitchen amitriptyline (ELAVIL) 25 MG tablet Take 1 tablet (25 mg total) by mouth at bedtime. 60 tablet 1  . amoxicillin (AMOXIL) 500 MG capsule Take 1 capsule (500 mg total) by mouth 3 (three) times daily. 15 capsule 0  . Diclofenac Sodium 2 % SOLN Place 1 application onto the skin 2 (two) times daily. FOR 60 TO 90 DAYS 112 g 1  . Ferrous Sulfate (IRON) 325 (65 FE) MG TABS Take 325 mg by mouth daily. 30 each 5  . fluticasone (FLONASE) 50 MCG/ACT nasal spray Place 2 sprays into both nostrils daily. 16 g 0  . guaiFENesin 200 MG tablet Take 1 tablet (200 mg total) by mouth every 4 (four) hours as needed for cough or to loosen phlegm. 30 suppository 0  . hydrocortisone 2.5 % ointment Apply topically 2 (two) times daily. 453 g 0  . hydrOXYzine (VISTARIL) 25 MG capsule Take 1 capsule (25 mg total) by mouth every 6 (six) hours as needed. 30 capsule 0  . hyoscyamine (LEVSIN SL) 0.125 MG SL tablet Take 0.125 mg by mouth every 6 (six) hours as needed for cramping.     Marland Kitchen ipratropium (ATROVENT) 0.06 %  nasal spray Place 2 sprays into both nostrils 4 (four) times daily. 15 mL 0  . meloxicam (MOBIC) 15 MG tablet Take 1 tablet (15 mg total) by mouth daily. 90 tablet 1  . Multiple Vitamin (MULTIVITAMIN WITH MINERALS) TABS Take 1 tablet by mouth daily.    . mupirocin ointment (BACTROBAN) 2 % Apply to both nostrils TID for 1 month. 22 g 1  . omeprazole (PRILOSEC) 40 MG capsule TAKE ONE CAPSULE BY MOUTH TWICE DAILY BEFORE A MEAL 60 capsule 3  . predniSONE (DELTASONE) 10 MG tablet 6 tabs po day 1, 5 tabs po day 2, 4  tabs po day 3, 3 tabs po day 4, 2 tabs po day 5, 1 tab po day 6 21 tablet 0  . promethazine (PHENERGAN) 12.5 MG tablet Take 1 tablet (12.5 mg total) by mouth every 6 (six) hours as needed for nausea or vomiting. 15 tablet 0  . traMADol (ULTRAM) 50 MG tablet Take 1 tablet (50 mg total) by mouth every 8 (eight) hours as needed. 120 tablet 0  . valACYclovir (VALTREX) 1000 MG tablet Take 0.5 tablets (500 mg total) by mouth 2 (two) times daily. 7 tablet 5   No current facility-administered medications for this visit.    Objective: Office vital signs reviewed. BP 121/75 mmHg  Pulse 88  Temp(Src) 98.8 F (37.1 C) (Oral)  Wt 205 lb 3.2 oz (93.078 kg)   Physical Examination:  General: Awake, alert, well- nourished, NAD ENMT:  TMs intact, normal light reflex, no erythema, no bulging. Nasal turbinates moist. Clear nasal drainage. MMM, Oropharynx clear with erythema. Eyes: Conjunctiva non-injected. PERRL.  Cardio: RRR, no m/r/g noted.  Pulm: No increased WOB.  CTAB, without wheezes, rhonchi or crackles noted.    Assessment/Plan: Acute upper respiratory infection Very complicated patient presenting with a plethora of URI type symptoms that have been present for over 2 weeks. It is difficult to tell if she had a double sickening concerning for an initial viral infection with a superimposed bacterial infection. Given the duration of symptoms that have continued to worsen, will treat as a bacterial infection. -Rx for amoxicillin 500mg  q8hrs  -Guaifenesin for phlegm/congestion  -Discussed RTC precautions- worsening symptoms, new symptoms, inability to take PO or fluids.     No orders of the defined types were placed in this encounter.    Meds ordered this encounter  Medications  . guaiFENesin 200 MG tablet    Sig: Take 1 tablet (200 mg total) by mouth every 4 (four) hours as needed for cough or to loosen phlegm.    Dispense:  30 suppository    Refill:  0  . amoxicillin (AMOXIL) 500 MG  capsule    Sig: Take 1 capsule (500 mg total) by mouth 3 (three) times daily.    Dispense:  15 capsule    Refill:  0  . promethazine (PHENERGAN) 12.5 MG tablet    Sig: Take 1 tablet (12.5 mg total) by mouth every 6 (six) hours as needed for nausea or vomiting.    Dispense:  15 tablet    Refill:  [place] PGY-2, [place]

## 2014-12-16 NOTE — Telephone Encounter (Signed)
LM for patient ok per DPR scanned in chart.  Please assist her in making an appt with same day to discuss these worsening symptoms if she calls back.  Thanks [employer]

## 2014-12-27 ENCOUNTER — Emergency Department (HOSPITAL_COMMUNITY)
Admission: EM | Admit: 2014-12-27 | Discharge: 2014-12-27 | Disposition: A | Discharge status: Home / Self Care | Payer: No Typology Code available for payment source | Attending: Emergency Medicine | Admitting: Emergency Medicine

## 2014-12-27 ENCOUNTER — Encounter (HOSPITAL_COMMUNITY): Payer: Self-pay | Admitting: *Deleted

## 2014-12-27 DIAGNOSIS — J029 Acute pharyngitis, unspecified: Secondary | ICD-10-CM | POA: Diagnosis not present

## 2014-12-27 DIAGNOSIS — M791 Myalgia: Secondary | ICD-10-CM | POA: Diagnosis not present

## 2014-12-27 DIAGNOSIS — Z91040 Latex allergy status: Secondary | ICD-10-CM | POA: Insufficient documentation

## 2014-12-27 DIAGNOSIS — Z7952 Long term (current) use of systemic steroids: Secondary | ICD-10-CM | POA: Diagnosis not present

## 2014-12-27 DIAGNOSIS — Z8679 Personal history of other diseases of the circulatory system: Secondary | ICD-10-CM | POA: Diagnosis not present

## 2014-12-27 DIAGNOSIS — R197 Diarrhea, unspecified: Secondary | ICD-10-CM | POA: Diagnosis not present

## 2014-12-27 DIAGNOSIS — Z79899 Other long term (current) drug therapy: Secondary | ICD-10-CM | POA: Insufficient documentation

## 2014-12-27 DIAGNOSIS — Z87440 Personal history of urinary (tract) infections: Secondary | ICD-10-CM | POA: Diagnosis not present

## 2014-12-27 DIAGNOSIS — J3489 Other specified disorders of nose and nasal sinuses: Secondary | ICD-10-CM | POA: Diagnosis not present

## 2014-12-27 DIAGNOSIS — R0981 Nasal congestion: Secondary | ICD-10-CM | POA: Diagnosis not present

## 2014-12-27 DIAGNOSIS — Z86718 Personal history of other venous thrombosis and embolism: Secondary | ICD-10-CM | POA: Insufficient documentation

## 2014-12-27 DIAGNOSIS — K219 Gastro-esophageal reflux disease without esophagitis: Secondary | ICD-10-CM | POA: Diagnosis not present

## 2014-12-27 DIAGNOSIS — R5383 Other fatigue: Secondary | ICD-10-CM | POA: Insufficient documentation

## 2014-12-27 DIAGNOSIS — Z791 Long term (current) use of non-steroidal anti-inflammatories (NSAID): Secondary | ICD-10-CM | POA: Diagnosis not present

## 2014-12-27 DIAGNOSIS — R51 Headache: Secondary | ICD-10-CM | POA: Diagnosis not present

## 2014-12-27 DIAGNOSIS — R509 Fever, unspecified: Secondary | ICD-10-CM | POA: Insufficient documentation

## 2014-12-27 DIAGNOSIS — R42 Dizziness and giddiness: Secondary | ICD-10-CM | POA: Diagnosis not present

## 2014-12-27 DIAGNOSIS — Z792 Long term (current) use of antibiotics: Secondary | ICD-10-CM | POA: Diagnosis not present

## 2014-12-27 DIAGNOSIS — Z7951 Long term (current) use of inhaled steroids: Secondary | ICD-10-CM | POA: Insufficient documentation

## 2014-12-27 MED ORDER — AZITHROMYCIN 250 MG PO TABS: 250.0000 mg | ORAL_TABLET | Freq: Every day | ORAL | Status: DC

## 2014-12-27 MED ORDER — CHLORHEXIDINE GLUCONATE 0.12 % MT SOLN: 15.0000 mL | Freq: Two times a day (BID) | OROMUCOSAL | Status: DC

## 2014-12-27 NOTE — ED Provider Notes (Signed)
CSN: [id number]     Arrival date & time 12/27/14  1106 History  By signing my name below, I, Helane Gunther, attest that this documentation has been prepared under the direction and in the presence of Domenic Moras, PA-C. Electronically Signed: Helane Gunther, ED Scribe. 12/27/2014. 11:35 AM.    Chief Complaint  Patient presents with  . Sore Throat   The history is provided by the patient. No language interpreter was used.   HPI Comments: Dominique [name] is a 39 y.o. female with a PMHx of DVT, varicose veins, GERD, and recurrent UTI's who presents to the Emergency Department complaining of worsening sore throat and generalized myalgias onset 4 weeks ago, worsening 2 days ago. She note she has contacted her PCP 2 days ago, who will see her tomorrow. She reports associated hot flashes, chills, subjective fever, congestion, rhinorrhea, throbbing leg pain, sore throat, nausea, fatigue, one episode of lightheadedness, HA, and diarrhea. She note she has IBS and that diarrhea is not a new occurrence. She states she was last seen on 12/7 when she was given amoxicillin for 5 days, just completed a few days ago. She reports her symptoms improved during treatment, but that her throat began "itching" again 2 days ago. She notes she has had 3 surgeries for varicose veins in the past. She states she does not have Raynaud's disease, and has seen several rheumatologists to find out what else may be causing her symptoms. Pt denies n/v, cough, SI and HI.  Pt is allergic to Zofran, doxycycline, contrast, latex, and eggs.   Past Medical History  Diagnosis Date  . DVT (deep venous thrombosis) ([place])   . Raynaud disease 12/21/2011  . Varicose veins   . GERD (gastroesophageal reflux disease)   . History of recurrent UTIs    Past Surgical History  Procedure Laterality Date  . Abdominal hysterectomy    . Cesarean section    . Appendectomy    . Hernia repair  2006  . Endovenous ablation saphenous vein w/ laser   02-01-2012    right greater saphenous vein and stab phlebectomy 10-20 incisions right leg  by Curt Jews, MD  . Endovenous ablation saphenous vein w/ laser Left 02-29-2012    left greater saphenous vein and stab phlebectomy left leg 10-20 incisions  by Curt Jews MD  . Esophagogastroduodenoscopy (egd) with propofol N/A 03/26/2012    Procedure: ESOPHAGOGASTRODUODENOSCOPY (EGD) WITH PROPOFOL;  Surgeon: Cleotis Nipper, MD;  Location: [facility];  Service: Endoscopy;  Laterality: N/A;  . Colonoscopy with propofol N/A 03/26/2012    Procedure: COLONOSCOPY WITH PROPOFOL;  Surgeon: Cleotis Nipper, MD;  Location: [facility];  Service: Endoscopy;  Laterality: N/A;  . Endovenous ablation saphenous vein w/ laser Left   . Esophagogastroduodenoscopy (egd) with propofol N/A 04/02/2014    Procedure: ESOPHAGOGASTRODUODENOSCOPY (EGD) WITH PROPOFOL;  Surgeon: Ronald Lobo, MD;  Location: [facility];  Service: Endoscopy;  Laterality: N/A;  . Balloon dilation N/A 04/02/2014    Procedure: BALLOON DILATION;  Surgeon: Ronald Lobo, MD;  Location: [facility];  Service: Endoscopy;  Laterality: N/A;   Family History  Problem Relation Age of Onset  . Diabetes Mother   . Heart disease Mother   . Hyperlipidemia Mother   . Hypertension Mother   . Other Mother     amputation, varicose veins  . Diabetes Father   . Hyperlipidemia Father   . Hypertension Father   . Peripheral vascular disease Father   . Other Father     varicose  veins  . Deep vein thrombosis Brother   . Other Brother     varicose veins   Social History  Substance Use Topics  . Smoking status: Never Smoker   . Smokeless tobacco: Never Used  . Alcohol Use: No   OB History    No data available     Review of Systems  Constitutional: Positive for fever, chills and fatigue.  HENT: Positive for congestion, rhinorrhea and sore throat.   Respiratory: Negative for cough.   Gastrointestinal: Positive for diarrhea. Negative for nausea and  vomiting.  Musculoskeletal: Positive for myalgias.  Neurological: Positive for light-headedness and headaches.  Psychiatric/Behavioral: Negative for suicidal ideas.    Allergies  Contrast media; Latex; Zofran; Doxycycline; Eggs or egg-derived products; Lactose intolerance (gi); and Nsaids  Home Medications   Prior to Admission medications   Medication Sig Start Date End Date Taking? Authorizing Provider  acetaminophen (TYLENOL) 325 MG tablet Take 650 mg by mouth every 6 (six) hours as needed for moderate pain.    Historical Provider, MD  amitriptyline (ELAVIL) 25 MG tablet Take 1 tablet (25 mg total) by mouth at bedtime. 12/01/14   Olam Idler, MD  amoxicillin (AMOXIL) 500 MG capsule Take 1 capsule (500 mg total) by mouth 3 (three) times daily. 12/16/14   Archie Patten, MD  azithromycin (ZITHROMAX) 250 MG tablet Take 1 tablet (250 mg total) by mouth daily. 12/27/14   Domenic Moras, PA-C  chlorhexidine (PERIDEX) 0.12 % solution Use as directed 15 mLs in the mouth or throat 2 (two) times daily. 12/27/14   Domenic Moras, PA-C  Diclofenac Sodium 2 % SOLN Place 1 application onto the skin 2 (two) times daily. FOR 60 TO 90 DAYS 11/05/14   [name], PA-C  Ferrous Sulfate (IRON) 325 (65 FE) MG TABS Take 325 mg by mouth daily. 03/09/14   Olam Idler, MD  fluticasone (FLONASE) 50 MCG/ACT nasal spray Place 2 sprays into both nostrils daily. 12/13/14   Melony Overly, MD  guaiFENesin 200 MG tablet Take 1 tablet (200 mg total) by mouth every 4 (four) hours as needed for cough or to loosen phlegm. 12/16/14   Archie Patten, MD  hydrocortisone 2.5 % ointment Apply topically 2 (two) times daily. 11/04/14   Elberta Leatherwood, MD  hydrOXYzine (VISTARIL) 25 MG capsule Take 1 capsule (25 mg total) by mouth every 6 (six) hours as needed. 07/02/14   Olam Idler, MD  hyoscyamine (LEVSIN SL) 0.125 MG SL tablet Take 0.125 mg by mouth every 6 (six) hours as needed for cramping.  02/19/14   Historical Provider, MD   ipratropium (ATROVENT) 0.06 % nasal spray Place 2 sprays into both nostrils 4 (four) times daily. 12/13/14   Melony Overly, MD  meloxicam (MOBIC) 15 MG tablet Take 1 tablet (15 mg total) by mouth daily. 12/01/14   Olam Idler, MD  Multiple Vitamin (MULTIVITAMIN WITH MINERALS) TABS Take 1 tablet by mouth daily.    Historical Provider, MD  mupirocin ointment (BACTROBAN) 2 % Apply to both nostrils TID for 1 month. 02/07/14   Harden Mo, MD  omeprazole (PRILOSEC) 40 MG capsule TAKE ONE CAPSULE BY MOUTH TWICE DAILY BEFORE A MEAL 12/01/14   Olam Idler, MD  predniSONE (DELTASONE) 10 MG tablet 6 tabs po day 1, 5 tabs po day 2, 4 tabs po day 3, 3 tabs po day 4, 2 tabs po day 5, 1 tab po day 6 11/12/14   Sharyn Lull  [place], MD  promethazine (PHENERGAN) 12.5 MG tablet Take 1 tablet (12.5 mg total) by mouth every 6 (six) hours as needed for nausea or vomiting. 12/16/14   Archie Patten, MD  traMADol (ULTRAM) 50 MG tablet Take 1 tablet (50 mg total) by mouth every 8 (eight) hours as needed. 12/01/14   Olam Idler, MD  valACYclovir (VALTREX) 1000 MG tablet Take 0.5 tablets (500 mg total) by mouth 2 (two) times daily. 12/01/14   Olam Idler, MD   BP 111/54 mmHg  Pulse 102  Temp(Src) 98.4 F (36.9 C) (Oral)  Resp 18  Ht 4\' 7"  (1.397 m)  Wt 203 lb (92.08 kg)  BMI 47.18 kg/m2  SpO2 97% Physical Exam  Constitutional: She is oriented to person, place, and time. She appears well-developed and well-nourished.  HENT:  Head: Normocephalic and atraumatic.  Right Ear: External ear normal.  Left Ear: External ear normal.  Nose: Nose normal.  Mouth/Throat: No oropharyngeal exudate.  TM's normal bilaterally, uvula is midline, no tonsillar enlargement or exudates, no trismus  Eyes: Conjunctivae are normal. Right eye exhibits no discharge. Left eye exhibits no discharge.  Neck: Normal range of motion. Neck supple.  Cardiovascular: Normal rate, regular rhythm and normal heart sounds.   Pulmonary/Chest:  Effort normal and breath sounds normal. No respiratory distress. She has no wheezes. She has no rales.  Lymphadenopathy:    She has no cervical adenopathy.  Neurological: She is alert and oriented to person, place, and time. Coordination normal.  Sensation intact  Skin: Skin is warm and dry. No rash noted. She is not diaphoretic. No erythema.  Psychiatric: She has a normal mood and affect.  Nursing note and vitals reviewed.   ED Course  Procedures  DIAGNOSTIC STUDIES: Oxygen Saturation is 97% on RA, adequate by my interpretation.    COORDINATION OF CARE: 11:30 AM - pt has viral sxs.  Doubt deep tissue infection.  Doubt pna.  Pt persistently requesting for additional abx, despite discussing risk/benefit of abx.  Discussed plans to order a Z-pack as pt requested another dose of antibiotics. Advised pt of adverse effects of doing so, especially after having just completed a cycle of amoxicillin. Pt advised of plan for treatment and pt agrees.  ENT referral given.   MDM   Final diagnoses:  Pharyngitis    BP 111/54 mmHg  Pulse 102  Temp(Src) 98.4 F (36.9 C) (Oral)  Resp 18  Ht 4\' 7"  (1.397 m)  Wt 92.08 kg  BMI 47.18 kg/m2  SpO2 97%   I personally performed the services described in this documentation, which was scribed in my presence. The recorded information has been reviewed and is accurate.     Domenic Moras, PA-C 12/27/14 [place], MD 12/27/14 [phone]

## 2014-12-27 NOTE — ED Notes (Signed)
Pt reports a cold for one week and her throat hurt worse on Friday.

## 2014-12-27 NOTE — ED Notes (Signed)
Declined W/C at D/C and was escorted to lobby by RN. 

## 2014-12-27 NOTE — Discharge Instructions (Signed)

## 2014-12-28 ENCOUNTER — Ambulatory Visit (INDEPENDENT_AMBULATORY_CARE_PROVIDER_SITE_OTHER): Payer: No Typology Code available for payment source | Admitting: Family Medicine

## 2014-12-28 VITALS — BP 110/68 | HR 115 | Temp 98.8°F | Wt 200.0 lb

## 2014-12-28 DIAGNOSIS — J069 Acute upper respiratory infection, unspecified: Secondary | ICD-10-CM | POA: Diagnosis not present

## 2014-12-28 NOTE — Assessment & Plan Note (Addendum)
Symptoms most likely viral in origin Patient was seen in the emergency department prescribed azithromycin She was seen in clinic on 12/7 and prescribed amoxicillin  - continue the antibiotics.  - Supportive care otherwise - Follow-up as needed

## 2014-12-28 NOTE — Progress Notes (Signed)
   Subjective:    Patient ID: Dominique [name], female    DOB: Nov 02, 1975, 39 y.o.   MRN: [id number]  Seen for Same day visit for   CC: sore throat    She was seen in the ED 12/18.  Was prescribed Azithromycin and a referral was placed to ENT  Having some URI symptoms about 3-4 weeks ago  Sore throat began 3 days ago. 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 Objective:  BP 110/68 mmHg  Pulse 115  Temp(Src) 98.8 F (37.1 C) (Oral)  Wt 200 lb (90.719 kg)  SpO2 99%  General: NAD HEENT: Clear conjunctiva, uvula midline, no tonsillar exudates, moist mucous membranes, tympanic membrane clear and intact bilaterally, no cervical lymphadenopathy, by determine its bilaterally Cardiac: tachycardic, normal heart sounds, no murmurs. Respiratory: CTAB, normal effort Extremities:  WWP. Skin: warm and dry, no rashes noted Neuro: alert and oriented, no focal deficits     Assessment & Plan:   Acute upper respiratory infection Symptoms most likely viral in origin Patient was seen in the emergency department prescribed azithromycin She was seen in clinic on 12/7 and prescribed amoxicillin  - continue the antibiotics.  - Supportive care otherwise - Follow-up as needed

## 2014-12-28 NOTE — Patient Instructions (Signed)
Thank you for coming in,   You can continue to use antibiotics.  He continues humidifier honey to help with her cough and sore throat.   Sign up for My Chart to have easy access to your labs results, and communication with your Primary care physician   Please feel free to call with any questions or concerns at any time, at [phone]. --Dr. Raeford Razor Upper Respiratory Infection, Adult Most upper respiratory infections (URIs) are a viral infection of the air passages leading to the lungs. A URI affects the nose, throat, and upper air passages. The most common type of URI is nasopharyngitis and is typically referred to as "the common cold." URIs run their course and usually go away on their own. Most of the time, a URI does not require medical attention, but sometimes a bacterial infection in the upper airways can follow a viral infection. This is called a secondary infection. Sinus and middle ear infections are common types of secondary upper respiratory infections. Bacterial pneumonia can also complicate a URI. A URI can worsen asthma and chronic obstructive pulmonary disease (COPD). Sometimes, these complications can require emergency medical care and may be life threatening.  CAUSES Almost all URIs are caused by viruses. A virus is a type of germ and can spread from one person to another.  RISKS FACTORS You may be at risk for a URI if:   You smoke.   You have chronic heart or lung disease.  You have a weakened defense (immune) system.   You are very young or very old.   You have nasal allergies or asthma.  You work in crowded or poorly ventilated areas.  You work in health care facilities or schools. SIGNS AND SYMPTOMS  Symptoms typically develop 2-3 days after you come in contact with a cold virus. Most viral URIs last 7-10 days. However, viral URIs from the influenza virus (flu virus) can last 14-18 days and are typically more severe. Symptoms may include:   Runny or stuffy  (congested) nose.   Sneezing.   Cough.   Sore throat.   Headache.   Fatigue.   Fever.   Loss of appetite.   Pain in your forehead, behind your eyes, and over your cheekbones (sinus pain).  Muscle aches.  DIAGNOSIS  Your health care provider may diagnose a URI by:  Physical exam.  Tests to check that your symptoms are not due to another condition such as:  Strep throat.  Sinusitis.  Pneumonia.  Asthma. TREATMENT  A URI goes away on its own with time. It cannot be cured with medicines, but medicines may be prescribed or recommended to relieve symptoms. Medicines may help:  Reduce your fever.  Reduce your cough.  Relieve nasal congestion. HOME CARE INSTRUCTIONS   Take medicines only as directed by your health care provider.   Gargle warm saltwater or take cough drops to comfort your throat as directed by your health care provider.  Use a warm mist humidifier or inhale steam from a shower to increase air moisture. This may make it easier to breathe.  Drink enough fluid to keep your urine clear or pale yellow.   Eat soups and other clear broths and maintain good nutrition.   Rest as needed.   Return to work when your temperature has returned to normal or as your health care provider advises. You may need to stay home longer to avoid infecting others. You can also use a face mask and careful hand washing to prevent spread  of the virus.  Increase the usage of your inhaler if you have asthma.   Do not use any tobacco products, including cigarettes, chewing tobacco, or electronic cigarettes. If you need help quitting, ask your health care provider. PREVENTION  The best way to protect yourself from getting a cold is to practice good hygiene.   Avoid oral or hand contact with people with cold symptoms.   Wash your hands often if contact occurs.  There is no clear evidence that vitamin C, vitamin E, echinacea, or exercise reduces the chance of  developing a cold. However, it is always recommended to get plenty of rest, exercise, and practice good nutrition.  SEEK MEDICAL CARE IF:   You are getting worse rather than better.   Your symptoms are not controlled by medicine.   You have chills.  You have worsening shortness of breath.  You have brown or red mucus.  You have yellow or brown nasal discharge.  You have pain in your face, especially when you bend forward.  You have a fever.  You have swollen neck glands.  You have pain while swallowing.  You have white areas in the back of your throat. SEEK IMMEDIATE MEDICAL CARE IF:   You have severe or persistent:  Headache.  Ear pain.  Sinus pain.  Chest pain.  You have chronic lung disease and any of the following:  Wheezing.  Prolonged cough.  Coughing up blood.  A change in your usual mucus.  You have a stiff neck.  You have changes in your:  Vision.  Hearing.  Thinking.  Mood. MAKE SURE YOU:   Understand these instructions.  Will watch your condition.  Will get help right away if you are not doing well or get worse.   This information is not intended to replace advice given to you by your health care provider. Make sure you discuss any questions you have with your health care provider.   Document Released: 06/21/2000 Document Revised: 05/12/2014 Document Reviewed: 04/02/2013 Elsevier Interactive Patient Education [employer].

## 2014-12-31 ENCOUNTER — Ambulatory Visit (INDEPENDENT_AMBULATORY_CARE_PROVIDER_SITE_OTHER): Payer: No Typology Code available for payment source | Admitting: Family Medicine

## 2014-12-31 ENCOUNTER — Encounter: Payer: Self-pay | Admitting: Family Medicine

## 2014-12-31 VITALS — BP 116/81 | HR 96 | Temp 98.1°F | Wt 199.8 lb

## 2014-12-31 DIAGNOSIS — M797 Fibromyalgia: Secondary | ICD-10-CM | POA: Diagnosis not present

## 2014-12-31 DIAGNOSIS — J069 Acute upper respiratory infection, unspecified: Secondary | ICD-10-CM

## 2014-12-31 MED ORDER — AMITRIPTYLINE HCL 100 MG PO TABS: 100.0000 mg | ORAL_TABLET | Freq: Every day | ORAL | Status: DC

## 2014-12-31 MED ORDER — PROMETHAZINE HCL 25 MG PO TABS: 25.0000 mg | ORAL_TABLET | Freq: Three times a day (TID) | ORAL | Status: DC | PRN

## 2014-12-31 MED ORDER — TRAMADOL HCL 50 MG PO TABS: 50.0000 mg | ORAL_TABLET | Freq: Three times a day (TID) | ORAL | Status: DC | PRN

## 2014-12-31 NOTE — Progress Notes (Signed)
  Patient name: Zenab Helmreich MRN [id number]  Date of birth: 1975-02-01  CC & HPI:  Shinequa Bayona is a 40 y.o. female presenting today for URI and chronic pain in hand and legs.   URI -  First diagnoses URI 12/4  Advised him to medical treatment -  12/7 given prescription for amoxicillin.  Facial fullness and pain improved with treatment however, nasal congestion, headache, sore throat, persisted -  Reports being seen at [facility] given prescription for Augmentin12/20.  Some improvement with this -  Continues to have nasal congestion, sore throat , headache worsening body aches.  Denies chest pain, shortness of breath or fevers.    bilateral hand and leg pains -  Reports improvement in sleep and pain with increasing Elavil to 50 mg daily at bedtime -  Continues to take Ultram 3-4 tablets daily to help with function, and allow her to continue to work  ROS: See HPI   Medications & Allergies: Reviewed  Social History: Reviewed:   Objective Findings:  Vitals: BP 116/81 mmHg  Pulse 96  Temp(Src) 98.1 F (36.7 C) (Oral)  Wt 199 lb 12.8 oz (90.629 kg)  Gen: NAD Head: Normocephalic/Atraumatic; Scalp w/o lesions Eyes:Sclera white; Conjunctiva pink; PERRLA; EOMI; Ears: TMs clear; Canals w/o lacerations; No external lesions Nose: Mucosa pink; Mild  sinus tenderness Throat: Oral mucosa pink, Pharynx w/o exudates CV: RRR w/o m/r/g, pulses +2 b/l Resp: CTAB w/ normal respiratory effort  Assessment & Plan:   Acute upper respiratory infection  Continues to have URI symptoms for 2 1/2 weeks with second sickening after initial improvement on amoxicillin.  Currently afebrile with vital signs stable.  No concerns for pneumonia.  Most likely sinusitis -  Recommended continuing  Previously prescribed Augmentin to complete ten-day course - Discussed additional symptomatic therapies  Fibromyalgia Improvement with Elavil 50 mg daily at bedtime.  -  Increased 100 mg daily at bedtime.   -  Refilled her Ultram # 160 (1 month supply)  - f/u in 1 month

## 2014-12-31 NOTE — Assessment & Plan Note (Signed)
Continues to have URI symptoms for 2 1/2 weeks with second sickening after initial improvement on amoxicillin.  Currently afebrile with vital signs stable.  No concerns for pneumonia.  Most likely sinusitis -  Recommended continuing  Previously prescribed Augmentin to complete ten-day course - Discussed additional symptomatic therapies

## 2014-12-31 NOTE — Assessment & Plan Note (Signed)
Improvement with Elavil 50 mg daily at bedtime.  -  Increased 100 mg daily at bedtime.  -  Refilled her Ultram # 160 (1 month supply)  - f/u in 1 month

## 2015-01-07 ENCOUNTER — Telehealth: Payer: Self-pay | Admitting: Family Medicine

## 2015-01-07 NOTE — Telephone Encounter (Signed)
Pt calling to check on status of this request. Dominique [name], ASA ° °

## 2015-01-07 NOTE — Telephone Encounter (Signed)
Spoke with pharmacy and patient's insurance only covers 100 tablets a month.  They won't allow her to refill again until 01/14/15.  Informed patient of this and she is aware and states that she is willing to pay out of pocket to pick up early.  Tried calling pharmacy but unable to reach anyone and will try again tomorrow. Jazmin Hartsell,CMA

## 2015-01-07 NOTE — Telephone Encounter (Signed)
Will forward to MD to advise on correct instructions.  Deandrea Rion,CMA

## 2015-01-07 NOTE — Telephone Encounter (Signed)
Pt calling and states that her Tramadol has been prescribed at 3 per day when she usually takes 4 per day and this is keeping her from getting it refilled. Please advise at the earliest convenience. Thank you, [name], ASA

## 2015-01-08 MED ORDER — TRAMADOL HCL 50 MG PO TABS: 50.0000 mg | ORAL_TABLET | Freq: Three times a day (TID) | ORAL | Status: DC | PRN

## 2015-01-08 NOTE — Telephone Encounter (Signed)
Patient called and would like a hard copy to take to [facility].  Will forward to Dr. Lonny Prude. Dominique [name],CMA

## 2015-01-08 NOTE — Telephone Encounter (Signed)
LM with new script refill on VM for [facility].  Informed patient that this has been done so she can check with them later today to get this filled Graylyn Bunney,CMA

## 2015-01-08 NOTE — Addendum Note (Signed)
Addended by: Cordelia Poche A on: 01/08/2015 12:23 PM   Modules accepted: Orders

## 2015-01-08 NOTE — Telephone Encounter (Signed)
Spoke with patient and she did speak with pharmacist and they informed her that they couldn't fill her script or allow her to pay for it cash pay unless she received a new prescription.  Will send note to covering provider and will speak with him to see if he can write patient enough to get through til next week, and then patient can discuss with provider.  I did inform patient that she would need to make an appt to see pcp in January per her last visit. Dominique [name],CMA

## 2015-01-08 NOTE — Telephone Encounter (Signed)
Okay to fill Tramadol 50mg  q8hrs prn for pain, #20 with no refills.

## 2015-01-08 NOTE — Telephone Encounter (Signed)
Tried calling [facility] again but still no luck with speaking with someone regarding this matter.  Will try again before closing today and then advise patient to go up there and speak with someone. Jazmin Hartsell,CMA

## 2015-01-19 ENCOUNTER — Other Ambulatory Visit: Payer: Self-pay | Admitting: *Deleted

## 2015-01-26 DIAGNOSIS — Z0279 Encounter for issue of other medical certificate: Secondary | ICD-10-CM | POA: Diagnosis not present

## 2015-01-27 MED ORDER — AMITRIPTYLINE HCL 100 MG PO TABS: 100.0000 mg | ORAL_TABLET | Freq: Every day | ORAL | Status: DC

## 2015-02-12 ENCOUNTER — Other Ambulatory Visit: Payer: Self-pay | Admitting: Family Medicine

## 2015-03-26 ENCOUNTER — Other Ambulatory Visit: Payer: Self-pay | Admitting: Family Medicine

## 2015-04-06 ENCOUNTER — Encounter (HOSPITAL_COMMUNITY): Payer: Self-pay | Admitting: Emergency Medicine

## 2015-04-06 DIAGNOSIS — Z79899 Other long term (current) drug therapy: Secondary | ICD-10-CM | POA: Diagnosis not present

## 2015-04-06 DIAGNOSIS — M79605 Pain in left leg: Secondary | ICD-10-CM | POA: Diagnosis not present

## 2015-04-06 DIAGNOSIS — E669 Obesity, unspecified: Secondary | ICD-10-CM | POA: Insufficient documentation

## 2015-04-06 DIAGNOSIS — Z791 Long term (current) use of non-steroidal anti-inflammatories (NSAID): Secondary | ICD-10-CM | POA: Diagnosis not present

## 2015-04-06 DIAGNOSIS — R079 Chest pain, unspecified: Secondary | ICD-10-CM | POA: Insufficient documentation

## 2015-04-06 DIAGNOSIS — R5383 Other fatigue: Secondary | ICD-10-CM | POA: Insufficient documentation

## 2015-04-06 DIAGNOSIS — R52 Pain, unspecified: Secondary | ICD-10-CM | POA: Diagnosis not present

## 2015-04-06 DIAGNOSIS — M545 Low back pain: Secondary | ICD-10-CM | POA: Diagnosis not present

## 2015-04-06 DIAGNOSIS — M79604 Pain in right leg: Secondary | ICD-10-CM | POA: Diagnosis not present

## 2015-04-06 LAB — COMPREHENSIVE METABOLIC PANEL
ALBUMIN: 3.2 g/dL — AB (ref 3.5–5.0)
ALK PHOS: 83 U/L (ref 38–126)
ALT: 10 U/L — AB (ref 14–54)
ANION GAP: 11 (ref 5–15)
AST: 16 U/L (ref 15–41)
BUN: 10 mg/dL (ref 6–20)
CALCIUM: 8.8 mg/dL — AB (ref 8.9–10.3)
CO2: 24 mmol/L (ref 22–32)
Chloride: 105 mmol/L (ref 101–111)
Creatinine, Ser: 0.74 mg/dL (ref 0.44–1.00)
GFR calc Af Amer: >60 mL/min (ref >60)
GFR calc non Af Amer: >60 mL/min (ref >60)
GLUCOSE: 101 mg/dL — AB (ref 65–99)
Potassium: 3.9 mmol/L (ref 3.5–5.1)
SODIUM: 140 mmol/L (ref 135–145)
Total Bilirubin: 0.3 mg/dL (ref 0.3–1.2)
Total Protein: 6.6 g/dL (ref 6.5–8.1)

## 2015-04-06 LAB — CBC WITH DIFFERENTIAL/PLATELET
BASOS ABS: 0 10*3/uL (ref 0.0–0.1)
BASOS PCT: 0 %
EOS ABS: 0.3 10*3/uL (ref 0.0–0.7)
Eosinophils Relative: 3 %
HCT: 32.8 % — ABNORMAL LOW (ref 36.0–46.0)
HEMOGLOBIN: 10.3 g/dL — AB (ref 12.0–15.0)
Lymphocytes Relative: 28 %
Lymphs Abs: 3.1 10*3/uL (ref 0.7–4.0)
MCH: 26.3 pg (ref 26.0–34.0)
MCHC: 31.4 g/dL (ref 30.0–36.0)
MCV: 83.9 fL (ref 78.0–100.0)
Monocytes Absolute: 0.7 10*3/uL (ref 0.1–1.0)
Monocytes Relative: 6 %
NEUTROS PCT: 63 %
Neutro Abs: 7.1 10*3/uL (ref 1.7–7.7)
Platelets: 472 10*3/uL — ABNORMAL HIGH (ref 150–400)
RBC: 3.91 MIL/uL (ref 3.87–5.11)
RDW: 17.4 % — ABNORMAL HIGH (ref 11.5–15.5)
WBC: 11.2 10*3/uL — AB (ref 4.0–10.5)

## 2015-04-06 MED ORDER — OXYCODONE-ACETAMINOPHEN 5-325 MG PO TABS: 1.0000 | ORAL_TABLET | ORAL | Status: DC | PRN

## 2015-04-06 MED ORDER — OXYCODONE-ACETAMINOPHEN 5-325 MG PO TABS
ORAL_TABLET | ORAL | Status: AC
  Filled 2015-04-06: qty 1

## 2015-04-06 NOTE — ED Notes (Signed)
Pt. reports bilateral leg pain more at left side for several weeks , denies injury /ambulatory , pain increases when standing /walking , pt. added GERD and generalized weakness/fatigue for several days .

## 2015-04-07 ENCOUNTER — Other Ambulatory Visit (HOSPITAL_COMMUNITY): Payer: Self-pay | Admitting: Emergency Medicine

## 2015-04-07 ENCOUNTER — Inpatient Hospital Stay (HOSPITAL_COMMUNITY): Admission: RE | Admit: 2015-04-07 | Payer: BLUE CROSS/BLUE SHIELD | Source: Ambulatory Visit

## 2015-04-07 ENCOUNTER — Emergency Department (HOSPITAL_COMMUNITY): Payer: BLUE CROSS/BLUE SHIELD

## 2015-04-07 ENCOUNTER — Emergency Department (HOSPITAL_COMMUNITY)
Admission: EM | Admit: 2015-04-07 | Discharge: 2015-04-07 | Disposition: A | Discharge status: Home / Self Care | Payer: BLUE CROSS/BLUE SHIELD | Attending: Emergency Medicine | Admitting: Emergency Medicine

## 2015-04-07 ENCOUNTER — Ambulatory Visit (EMERGENCY_DEPARTMENT_HOSPITAL)
Admission: RE | Admit: 2015-04-07 | Discharge: 2015-04-07 | Disposition: A | Discharge status: Home / Self Care | Payer: BLUE CROSS/BLUE SHIELD | Source: Ambulatory Visit | Attending: Emergency Medicine | Admitting: Emergency Medicine

## 2015-04-07 DIAGNOSIS — R52 Pain, unspecified: Secondary | ICD-10-CM

## 2015-04-07 DIAGNOSIS — M79605 Pain in left leg: Secondary | ICD-10-CM

## 2015-04-07 DIAGNOSIS — M79604 Pain in right leg: Secondary | ICD-10-CM

## 2015-04-07 HISTORY — DX: Obesity, unspecified: E66.9

## 2015-04-07 MED ORDER — CYCLOBENZAPRINE HCL 5 MG PO TABS: 5.0000 mg | ORAL_TABLET | Freq: Three times a day (TID) | ORAL | Status: DC | PRN

## 2015-04-07 MED ORDER — KETOROLAC TROMETHAMINE 30 MG/ML IJ SOLN
30.0000 mg | Freq: Once | INTRAMUSCULAR | Status: AC
  Administered 2015-04-07: 30 mg via INTRAMUSCULAR
  Filled 2015-04-07: qty 1

## 2015-04-07 MED ORDER — ENOXAPARIN SODIUM 100 MG/ML ~~LOC~~ SOLN
90.0000 mg | Freq: Once | SUBCUTANEOUS | Status: AC
  Administered 2015-04-07: 90 mg via SUBCUTANEOUS
  Filled 2015-04-07: qty 1

## 2015-04-07 NOTE — Discharge Instructions (Signed)
You will need to see your primary care doctor to get more tramadol for pain if needed. You can take the Flexeril to see if that helps with your pain. He can return to the hospital at 8:00 this morning to get your Doppler test of your legs or you can wait and have them call you for an appointment time later today.

## 2015-04-07 NOTE — ED Notes (Signed)
Pt to nurses station requesting to leave; RN requesting patient to stay for lovenox injection and discharge instructions

## 2015-04-07 NOTE — Progress Notes (Signed)
*  PRELIMINARY RESULTS* Vascular Ultrasound Lower extremity venous duplex has been completed.  Preliminary findings: No evidence of DVT or baker's cyst.   Landry Mellow, RDMS, RVT  04/07/2015, 4:38 PM

## 2015-04-07 NOTE — ED Provider Notes (Signed)
CSN: [id number]     Arrival date & time 04/06/15  2008 History  By signing my name below, I, Helane Gunther, attest that this documentation has been prepared under the direction and in the presence of Rolland Porter, MD at [place] [facility] . Electronically Signed: Helane Gunther, ED Scribe. 04/07/2015. 2:10 AM.     Chief Complaint  Patient presents with  . Leg Pain   The history is provided by the patient. No language interpreter was used.   HPI Comments: Dominique [name] is a 40 y.o. female with a PMHx of DVT (right leg), Raynaud disease, obesity, GERD, and varicose veins, as well as a PSHx of endovenous ablation of her lower extremities who presents to the Emergency Department complaining of chronic, constant, worsening bilateral upper thigh pain onset 2 weeks ago and worsening significantly as of this evening. Pt states she has leg pain at baseline, but not this severe. She notes the pain is worse and throbbing in the left upper leg. She reports associated leg swelling and discoloration, as well as mild lower back pain. She also endorses "fluttering" chest pain yesterday while at work. She reports exacerbation of the leg pain with standing up. She also endorses nausea and increased fatigue over the past few days, as well as waking up with an acidic taste in her mouth. She also notes one episode of nearly loss of bowel control, when she states she barely made it to the bathroom in time. She notes a PMHx of 3 vascular surgeries on her legs. She notes her vascular specialist is Dr Donnetta Hutching, whom she last saw 1.5 to 2 years ago after her most recent surgery. She notes she is on tramadol, prescribed by Dr Owens Shark, which she has been taking without relief. She notes she has also rested her legs and soaked hem in epsom salts without relief. 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 She notes she is on Prilosec BID and has ben taking gas pills due to feeling  gassy and bloated. Pt denies SOB.   PCP Dr Eloise Levels  Past Medical History  Diagnosis Date  . DVT (deep venous thrombosis) ([place])   . Raynaud disease 12/21/2011  . Varicose veins   . GERD (gastroesophageal reflux disease)   . History of recurrent UTIs   . Obesity    Past Surgical History  Procedure Laterality Date  . Abdominal hysterectomy    . Cesarean section    . Appendectomy    . Hernia repair  2006  . Endovenous ablation saphenous vein w/ laser  02-01-2012    right greater saphenous vein and stab phlebectomy 10-20 incisions right leg  by Curt Jews, MD  . Endovenous ablation saphenous vein w/ laser Left 02-29-2012    left greater saphenous vein and stab phlebectomy left leg 10-20 incisions  by Curt Jews MD  . Esophagogastroduodenoscopy (egd) with propofol N/A 03/26/2012    Procedure: ESOPHAGOGASTRODUODENOSCOPY (EGD) WITH PROPOFOL;  Surgeon: Cleotis Nipper, MD;  Location: [facility];  Service: Endoscopy;  Laterality: N/A;  . Colonoscopy with propofol N/A 03/26/2012    Procedure: COLONOSCOPY WITH PROPOFOL;  Surgeon: Cleotis Nipper, MD;  Location: [facility];  Service: Endoscopy;  Laterality: N/A;  . Endovenous ablation saphenous vein w/ laser Left   . Esophagogastroduodenoscopy (egd) with propofol N/A 04/02/2014    Procedure: ESOPHAGOGASTRODUODENOSCOPY (EGD) WITH PROPOFOL;  Surgeon: Ronald Lobo, MD;  Location: [facility];  Service: Endoscopy;  Laterality: N/A;  . Balloon dilation N/A 04/02/2014    Procedure: BALLOON DILATION;  Surgeon: Ronald Lobo, MD;  Location: [facility];  Service: Endoscopy;  Laterality: N/A;   Family History  Problem Relation Age of Onset  . Diabetes Mother   . Heart disease Mother   . Hyperlipidemia Mother   . Hypertension Mother   . Other Mother     amputation, varicose veins  . Diabetes Father   . Hyperlipidemia Father   . Hypertension Father   . Peripheral vascular disease Father   . Other Father     varicose veins  . Deep  vein thrombosis Brother   . Other Brother     varicose veins   Social History  Substance Use Topics  . Smoking status: Never Smoker   . Smokeless tobacco: Never Used  . Alcohol Use: No   employed  OB History    No data available     Review of Systems  Constitutional: Positive for fatigue.  Respiratory: Negative for shortness of breath.   Cardiovascular: Positive for chest pain ("fluttering") and leg swelling.  Gastrointestinal: Positive for nausea.  Musculoskeletal: Positive for myalgias and back pain.  Skin: Positive for color change.  All other systems reviewed and are negative.   Allergies  Contrast media; Latex; Zofran; Doxycycline; Eggs or egg-derived products; Lactose intolerance (gi); and Nsaids  Home Medications   Prior to Admission medications   Medication Sig Start Date End Date Taking? Authorizing Provider  amitriptyline (ELAVIL) 100 MG tablet Take 1 tablet (100 mg total) by mouth at bedtime. 01/27/15  Yes Olam Idler, MD  Ferrous Sulfate (IRON) 325 (65 FE) MG TABS Take 325 mg by mouth daily. 03/09/14  Yes Olam Idler, MD  gabapentin (NEURONTIN) 100 MG capsule Take 100 mg by mouth 3 (three) times daily. 03/25/15  Yes Historical Provider, MD  hydrOXYzine (VISTARIL) 25 MG capsule Take 1 capsule (25 mg total) by mouth every 6 (six) hours as needed. 07/02/14  Yes Olam Idler, MD  meloxicam (MOBIC) 15 MG tablet TAKE ONE TABLET BY MOUTH EVERY DAY 03/30/15  Yes Olam Idler, MD  mupirocin ointment (BACTROBAN) 2 % Apply to both nostrils TID for 1 month. Patient taking differently: Apply 1 application topically 3 (three) times daily.  02/07/14  Yes Harden Mo, MD  omeprazole (PRILOSEC) 40 MG capsule TAKE ONE CAPSULE BY MOUTH TWICE DAILY BEFORE MEALS 03/30/15  Yes Olam Idler, MD  QC LORATADINE ALLERGY RELIEF 10 MG tablet TAKE ONE TABLET BY MOUTH EVERY DAY 02/12/15  Yes Olam Idler, MD  traMADol (ULTRAM) 50 MG tablet Take 1 tablet (50 mg total) by mouth every 8  (eight) hours as needed. 01/08/15  Yes Mariel Aloe, MD  valACYclovir (VALTREX) 1000 MG tablet Take 0.5 tablets (500 mg total) by mouth 2 (two) times daily. 12/01/14  Yes Olam Idler, MD  amoxicillin (AMOXIL) 500 MG capsule Take 1 capsule (500 mg total) by mouth 3 (three) times daily. Patient not taking: Reported on 04/07/2015 12/16/14   Archie Patten, MD  azithromycin (ZITHROMAX) 250 MG tablet Take 1 tablet (250 mg total) by mouth daily. Patient not taking: Reported on 04/07/2015 12/27/14   Domenic Moras, PA-C  chlorhexidine (PERIDEX) 0.12 % solution Use as directed 15 mLs in the mouth or throat 2 (two) times daily. Patient not taking: Reported on 04/07/2015 12/27/14   Domenic Moras, PA-C  cyclobenzaprine (FLEXERIL) 5 MG tablet Take 1 tablet (5  mg total) by mouth 3 (three) times daily as needed (muscle pain). 04/07/15   Rolland Porter, MD  Diclofenac Sodium 2 % SOLN Place 1 application onto the skin 2 (two) times daily. FOR 60 TO 90 DAYS Patient not taking: Reported on 04/07/2015 11/05/14   [name], PA-C  fluticasone [facility]) 50 MCG/ACT nasal spray Place 2 sprays into both nostrils daily. Patient not taking: Reported on 04/07/2015 12/13/14   Melony Overly, MD  guaiFENesin 200 MG tablet Take 1 tablet (200 mg total) by mouth every 4 (four) hours as needed for cough or to loosen phlegm. Patient not taking: Reported on 04/07/2015 12/16/14   Archie Patten, MD  hydrocortisone 2.5 % ointment Apply topically 2 (two) times daily. Patient not taking: Reported on 04/07/2015 11/04/14   Elberta Leatherwood, MD  ipratropium (ATROVENT) 0.06 % nasal spray Place 2 sprays into both nostrils 4 (four) times daily. Patient not taking: Reported on 04/07/2015 12/13/14   Melony Overly, MD  predniSONE (DELTASONE) 10 MG tablet 6 tabs po day 1, 5 tabs po day 2, 4 tabs po day 3, 3 tabs po day 4, 2 tabs po day 5, 1 tab po day 6 Patient not taking: Reported on 04/07/2015 11/12/14   Dene Gentry, MD  promethazine (PHENERGAN) 12.5 MG  tablet Take 1 tablet (12.5 mg total) by mouth every 6 (six) hours as needed for nausea or vomiting. Patient not taking: Reported on 04/07/2015 12/16/14   Archie Patten, MD  promethazine (PHENERGAN) 25 MG tablet Take 1 tablet (25 mg total) by mouth every 8 (eight) hours as needed for nausea or vomiting. Patient not taking: Reported on 04/07/2015 12/31/14   Olam Idler, MD   BP 124/73 mmHg  Pulse 96  Temp(Src) 99.3 F (37.4 C) (Oral)  Resp 18  Ht 4\' 7"  (1.397 m)  Wt 201 lb (91.173 kg)  BMI 46.72 kg/m2  SpO2 100%  Vital signs normal   Physical Exam  Constitutional: She is oriented to person, place, and time. She appears well-developed and well-nourished.  Non-toxic appearance. She does not appear ill. No distress.  HENT:  Head: Normocephalic and atraumatic.  Right Ear: External ear normal.  Left Ear: External ear normal.  Nose: Nose normal. No mucosal edema or rhinorrhea.  Mouth/Throat: Oropharynx is clear and moist and mucous membranes are normal. No dental abscesses or uvula swelling.  Eyes: Conjunctivae and EOM are normal. Pupils are equal, round, and reactive to light.  Neck: Normal range of motion and full passive range of motion without pain. Neck supple.  Cardiovascular: Normal rate, regular rhythm and normal heart sounds.  Exam reveals no gallop and no friction rub.   No murmur heard. Pulmonary/Chest: Effort normal and breath sounds normal. No respiratory distress. She has no wheezes. She has no rhonchi. She has no rales. She exhibits no tenderness and no crepitus.  Abdominal: Soft. Normal appearance and bowel sounds are normal. She exhibits no distension. There is no tenderness. There is no rebound and no guarding.  Musculoskeletal: Normal range of motion. She exhibits tenderness. She exhibits no edema.  Moves all extremities well. TTP in the thighs, no edema, no calf TTP  Neurological: She is alert and oriented to person, place, and time. She has normal strength. No cranial  nerve deficit.  Skin: Skin is warm, dry and intact. No rash noted. No erythema. No pallor.  Psychiatric: She has a normal mood and affect. Her speech is normal and behavior is normal. Her  mood appears not anxious.  Nursing note and vitals reviewed.   ED Course  Procedures   Medications  oxyCODONE-acetaminophen (PERCOCET/ROXICET) 5-325 MG per tablet 1 tablet (not administered)  oxyCODONE-acetaminophen (PERCOCET/ROXICET) 5-325 MG per tablet (  Refused 04/06/15 2045)  enoxaparin (LOVENOX) injection 90 mg (not administered)  ketorolac (TORADOL) 30 MG/ML injection 30 mg (30 mg Intramuscular Given 04/07/15 0225)    DIAGNOSTIC STUDIES: Oxygen Saturation is 100% on RA, normal by my interpretation.    COORDINATION OF CARE: 2:10 AM - Discussed lab results, as well as plans to order a [place] and a muscle relaxant. Pt advised of plan for treatment and pt agrees.  Patient refused Percocet because she has a history of narcotic addiction. She was given Toradol IM. We discussed she could not be discharged home with anti-inflammatory she did have a GI bleed last year. Patient will be scheduled for an outpatient Doppler ultrasound of her legs to look for DVT. She was discharged home with Flexeril. She was given a dose of Lovenox 1 mg per KG, 90 mg to cover her until she gets her Doppler ultrasound done.  Review of the [place] shows patient gets #120 tramadol monthly, they were last filled March 1. She has been getting them for the last 3 months. Before that she was only getting 20 month.  Results for orders placed or performed during the hospital encounter of 04/07/15  CBC with Differential  Result Value Ref Range   WBC 11.2 (H) 4.0 - 10.5 K/uL   RBC 3.91 3.87 - 5.11 MIL/uL   Hemoglobin 10.3 (L) 12.0 - 15.0 g/dL   HCT 32.8 (L) 36.0 - 46.0 %   MCV 83.9 78.0 - 100.0 fL   MCH 26.3 26.0 - 34.0 pg   MCHC 31.4 30.0 - 36.0 g/dL   RDW 17.4 (H) 11.5 - 15.5 %   Platelets 472 (H) 150 - 400 K/uL    Neutrophils Relative % 63 %   Neutro Abs 7.1 1.7 - 7.7 K/uL   Lymphocytes Relative 28 %   Lymphs Abs 3.1 0.7 - 4.0 K/uL   Monocytes Relative 6 %   Monocytes Absolute 0.7 0.1 - 1.0 K/uL   Eosinophils Relative 3 %   Eosinophils Absolute 0.3 0.0 - 0.7 K/uL   Basophils Relative 0 %   Basophils Absolute 0.0 0.0 - 0.1 K/uL  Comprehensive metabolic panel  Result Value Ref Range   Sodium 140 135 - 145 mmol/L   Potassium 3.9 3.5 - 5.1 mmol/L   Chloride 105 101 - 111 mmol/L   CO2 24 22 - 32 mmol/L   Glucose, Bld 101 (H) 65 - 99 mg/dL   BUN 10 6 - 20 mg/dL   Creatinine, Ser 0.74 0.44 - 1.00 mg/dL   Calcium 8.8 (L) 8.9 - 10.3 mg/dL   Total Protein 6.6 6.5 - 8.1 g/dL   Albumin 3.2 (L) 3.5 - 5.0 g/dL   AST 16 15 - 41 U/L   ALT 10 (L) 14 - 54 U/L   Alkaline Phosphatase 83 38 - 126 U/L   Total Bilirubin 0.3 0.3 - 1.2 mg/dL   GFR calc non Af Amer >60 >60 mL/min   GFR calc Af Amer >60 >60 mL/min   Anion gap 11 5 - 15   Laboratory interpretation all normal except except for leukocytosis       MDM   Final diagnoses:  Bilateral lower extremity pain    New Prescriptions   CYCLOBENZAPRINE (FLEXERIL) 5 MG TABLET  Take 1 tablet (5 mg total) by mouth 3 (three) times daily as needed (muscle pain).    Plan discharge  ,Rolland Porter, MD, FACEP   I personally performed the services described in this documentation, which was scribed in my presence. The recorded information has been reviewed and considered.  Laboratory interpretation all normal except    Rolland Porter, MD 04/07/15 [id number]

## 2015-04-07 NOTE — ED Notes (Signed)
Patient transported to CT 

## 2015-04-29 ENCOUNTER — Other Ambulatory Visit: Payer: Self-pay | Admitting: *Deleted

## 2015-04-29 DIAGNOSIS — I83892 Varicose veins of left lower extremities with other complications: Secondary | ICD-10-CM

## 2015-07-09 DIAGNOSIS — Z0279 Encounter for issue of other medical certificate: Secondary | ICD-10-CM | POA: Diagnosis not present

## 2015-07-22 ENCOUNTER — Encounter: Payer: Self-pay | Admitting: Vascular Surgery

## 2015-07-27 ENCOUNTER — Ambulatory Visit (INDEPENDENT_AMBULATORY_CARE_PROVIDER_SITE_OTHER): Payer: BLUE CROSS/BLUE SHIELD | Admitting: Vascular Surgery

## 2015-07-27 ENCOUNTER — Encounter: Payer: Self-pay | Admitting: Vascular Surgery

## 2015-07-27 ENCOUNTER — Ambulatory Visit (HOSPITAL_COMMUNITY)
Admission: RE | Admit: 2015-07-27 | Discharge: 2015-07-27 | Disposition: A | Discharge status: Home / Self Care | Payer: BLUE CROSS/BLUE SHIELD | Source: Ambulatory Visit | Attending: Vascular Surgery | Admitting: Vascular Surgery

## 2015-07-27 VITALS — BP 92/66 | HR 91 | Temp 99.2°F | Resp 20 | Ht <= 58 in | Wt 204.0 lb

## 2015-07-27 DIAGNOSIS — I868 Varicose veins of other specified sites: Secondary | ICD-10-CM

## 2015-07-27 DIAGNOSIS — E669 Obesity, unspecified: Secondary | ICD-10-CM | POA: Diagnosis not present

## 2015-07-27 DIAGNOSIS — I8390 Asymptomatic varicose veins of unspecified lower extremity: Secondary | ICD-10-CM

## 2015-07-27 DIAGNOSIS — I83892 Varicose veins of left lower extremities with other complications: Secondary | ICD-10-CM

## 2015-07-27 DIAGNOSIS — K219 Gastro-esophageal reflux disease without esophagitis: Secondary | ICD-10-CM | POA: Diagnosis not present

## 2015-07-27 DIAGNOSIS — Z6841 Body Mass Index (BMI) 40.0 and over, adult: Secondary | ICD-10-CM | POA: Diagnosis not present

## 2015-07-27 NOTE — Progress Notes (Signed)
HISTORY AND PHYSICAL     CC:  Hands and legs swell and turn red, pain has gotten severe Referring Provider:  Eloise Levels, NP  HPI: This is a 40 y.o. female who has a hx of left GSV laser ablation by Dr. Donnetta Hutching 03/01/12.  She has done well up until recently when she noticed both legs & her hands starting to swell and change color becoming red.  She states that she was working two jobs working 6-8 days per week and was able to climb stairs.  She states that she was having trouble with 5 steps and then 2 steps and now she feels her legs are heavy and just having trouble walking.  She says she cannot pinpoint anything that triggers these flare ups. She says that her indigestion has gotten worse.  She has been to pain management and they are sending her for nerve conduction studies.  She states that she did see the rheumatologist today and he really didn't have anything to offer. She had tried to avoid getting disability, however, she is is not able to work and not able to pay her bills.  She has been "clean" for 9 years this past Saturday.    Past Medical History  Diagnosis Date  . DVT (deep venous thrombosis) ([place])   . Raynaud disease 12/21/2011  . Varicose veins   . GERD (gastroesophageal reflux disease)   . History of recurrent UTIs   . Obesity     Past Surgical History  Procedure Laterality Date  . Abdominal hysterectomy    . Cesarean section    . Appendectomy    . Hernia repair  2006  . Endovenous ablation saphenous vein w/ laser  02-01-2012    right greater saphenous vein and stab phlebectomy 10-20 incisions right leg  by Curt Jews, MD  . Endovenous ablation saphenous vein w/ laser Left 02-29-2012    left greater saphenous vein and stab phlebectomy left leg 10-20 incisions  by Curt Jews MD  . Esophagogastroduodenoscopy (egd) with propofol N/A 03/26/2012    Procedure: ESOPHAGOGASTRODUODENOSCOPY (EGD) WITH PROPOFOL;  Surgeon: Cleotis Nipper, MD;  Location: [facility];  Service:  Endoscopy;  Laterality: N/A;  . Colonoscopy with propofol N/A 03/26/2012    Procedure: COLONOSCOPY WITH PROPOFOL;  Surgeon: Cleotis Nipper, MD;  Location: [facility];  Service: Endoscopy;  Laterality: N/A;  . Endovenous ablation saphenous vein w/ laser Left   . Esophagogastroduodenoscopy (egd) with propofol N/A 04/02/2014    Procedure: ESOPHAGOGASTRODUODENOSCOPY (EGD) WITH PROPOFOL;  Surgeon: Ronald Lobo, MD;  Location: [facility];  Service: Endoscopy;  Laterality: N/A;  . Balloon dilation N/A 04/02/2014    Procedure: BALLOON DILATION;  Surgeon: Ronald Lobo, MD;  Location: [facility];  Service: Endoscopy;  Laterality: N/A;    Allergies  Allergen Reactions  . Contrast Media [Iodinated Diagnostic Agents] Anaphylaxis and Swelling    Tongue swelling  . Latex Anaphylaxis  . Zofran [Ondansetron Hcl] Other (See Comments)    Hives, numbness of legs with previous use  . Doxycycline     MADE LEGS HURT  . Eggs Or Egg-Derived Products Nausea And Vomiting  . Lactose Intolerance (Gi) Diarrhea  . Nsaids     Worsened gastritis; 02/22/14 GI bleed    Current Outpatient Prescriptions  Medication Sig Dispense Refill  . amitriptyline (ELAVIL) 100 MG tablet Take 1 tablet (100 mg total) by mouth at bedtime. 45 tablet 1  . Diclofenac Sodium 2 % SOLN Place 1 application onto the skin 2 (two)  times daily. FOR 60 TO 90 DAYS 112 g 1  . gabapentin (NEURONTIN) 100 MG capsule Take 100 mg by mouth 3 (three) times daily.  5  . guaiFENesin 200 MG tablet Take 1 tablet (200 mg total) by mouth every 4 (four) hours as needed for cough or to loosen phlegm. 30 suppository 0  . hydrocortisone 2.5 % ointment Apply topically 2 (two) times daily. 453 g 0  . hydrOXYzine (VISTARIL) 25 MG capsule Take 1 capsule (25 mg total) by mouth every 6 (six) hours as needed. 30 capsule 0  . ipratropium (ATROVENT) 0.06 % nasal spray Place 2 sprays into both nostrils 4 (four) times daily. 15 mL 0  . meloxicam (MOBIC) 15 MG tablet  TAKE ONE TABLET BY MOUTH EVERY DAY 90 tablet 0  . mupirocin ointment (BACTROBAN) 2 % Apply to both nostrils TID for 1 month. (Patient taking differently: Apply 1 application topically 3 (three) times daily. ) 22 g 1  . omeprazole (PRILOSEC) 40 MG capsule TAKE ONE CAPSULE BY MOUTH TWICE DAILY BEFORE MEALS 60 capsule 4  . promethazine (PHENERGAN) 12.5 MG tablet Take 1 tablet (12.5 mg total) by mouth every 6 (six) hours as needed for nausea or vomiting. 15 tablet 0  . QC LORATADINE ALLERGY RELIEF 10 MG tablet TAKE ONE TABLET BY MOUTH EVERY DAY 90 tablet 3  . traMADol (ULTRAM) 50 MG tablet Take 1 tablet (50 mg total) by mouth every 8 (eight) hours as needed. 20 tablet 0  . valACYclovir (VALTREX) 1000 MG tablet Take 0.5 tablets (500 mg total) by mouth 2 (two) times daily. 7 tablet 5  . amoxicillin (AMOXIL) 500 MG capsule Take 1 capsule (500 mg total) by mouth 3 (three) times daily. (Patient not taking: Reported on 04/07/2015) 15 capsule 0  . azithromycin (ZITHROMAX) 250 MG tablet Take 1 tablet (250 mg total) by mouth daily. (Patient not taking: Reported on 04/07/2015) 4 tablet 0  . chlorhexidine (PERIDEX) 0.12 % solution Use as directed 15 mLs in the mouth or throat 2 (two) times daily. (Patient not taking: Reported on 04/07/2015) 120 mL 0  . cyclobenzaprine (FLEXERIL) 5 MG tablet Take 1 tablet (5 mg total) by mouth 3 (three) times daily as needed (muscle pain). (Patient not taking: Reported on 07/27/2015) 30 tablet 0  . Ferrous Sulfate (IRON) 325 (65 FE) MG TABS Take 325 mg by mouth daily. (Patient not taking: Reported on 07/27/2015) 30 each 5  . fluticasone (FLONASE) 50 MCG/ACT nasal spray Place 2 sprays into both nostrils daily. (Patient not taking: Reported on 04/07/2015) 16 g 0  . predniSONE (DELTASONE) 10 MG tablet 6 tabs po day 1, 5 tabs po day 2, 4 tabs po day 3, 3 tabs po day 4, 2 tabs po day 5, 1 tab po day 6 (Patient not taking: Reported on 04/07/2015) 21 tablet 0  . promethazine (PHENERGAN) 25 MG  tablet Take 1 tablet (25 mg total) by mouth every 8 (eight) hours as needed for nausea or vomiting. (Patient not taking: Reported on 04/07/2015) 20 tablet 0   No current facility-administered medications for this visit.    Family History  Problem Relation Age of Onset  . Diabetes Mother   . Heart disease Mother   . Hyperlipidemia Mother   . Hypertension Mother   . Other Mother     amputation, varicose veins  . Diabetes Father   . Hyperlipidemia Father   . Hypertension Father   . Peripheral vascular disease Father   . Other Father  varicose veins  . Deep vein thrombosis Brother   . Other Brother     varicose veins    Social History   Social History  . Marital Status: Divorced    Spouse Name: N/A  . Number of Children: N/A  . Years of Education: N/A   Occupational History  . Not on file.   Social History Main Topics  . Smoking status: Never Smoker   . Smokeless tobacco: Never Used  . Alcohol Use: No  . Drug Use: No     Comment: former narcotic presription abuse  . Sexual Activity: Not on file   Other Topics Concern  . Not on file   Social History Narrative     REVIEW OF SYSTEMS:   [X]  denotes positive finding, [ ]  denotes negative finding Cardiac  Comments:  Chest pain or chest pressure: x sometimes  Shortness of breath upon exertion: x   Short of breath when lying flat: x   Irregular heart rhythm:        Vascular    Pain in calf, thigh, or hip brought on by ambulation: x   Pain in feet at night that wakes you up from your sleep:     Blood clot in your veins: x In the past  Leg swelling:  x       Pulmonary    Oxygen at home:    Productive cough:     Wheezing:         Neurologic    Sudden weakness in arms or legs:  x   Sudden numbness in arms or legs:  x   Sudden onset of difficulty speaking or slurred speech: x   Temporary loss of vision in one eye:  x   Problems with dizziness:  x       Gastrointestinal    Blood in stool:  x   Vomited  blood:         Genitourinary    Burning when urinating:     Blood in urine:        Psychiatric    Major depression:         Hematologic    Bleeding problems:    Problems with blood clotting too easily: x       Skin    Rashes or ulcers: x Rash and skin color changes      Constitutional    Fever or chills:      PHYSICAL EXAMINATION:  Filed Vitals:   07/27/15 1520  BP: 92/66  Pulse: 91  Temp: 99.2 F (37.3 C)  Resp: 20   Body mass index is 47.41 kg/(m^2).  General:  WDWN in NAD; vital signs documented above Gait: Not observed HENT: WNL, normocephalic Pulmonary: normal non-labored breathing , without Rales, rhonchi,  wheezing Cardiac: regular HR Abdomen: obese Skin: without rashes; bilateral hands are erythematous Vascular Exam/Pulses:  Right Left  Radial 2+ (normal) 2+ (normal)  DP 2+ (normal) 2+ (normal)  PT 2+ (normal) 2+ (normal)   Extremities: without ischemic changes, without Gangrene , without cellulitis; without open wounds; mild edema BLE Musculoskeletal: no muscle wasting or atrophy  Neurologic: A&O X 3;  No focal weakness or paresthesias are detected Psychiatric:  The pt has Normal affect.   Non-Invasive Vascular Imaging:   Lower extremity venous duplex reflux evaluation 07/27/15: 1.  No evidence of LLE DVT 2.  Reflux noted in the left common femoral and femoral vein 3.  Reflux noted in the left saphenofemoral junction and  in the proximal greater saphenous vein and in the vein that leaves the fascia but extends on distally with tortuosity. 4.  Reflux in the left small saphenous vein in the proximal to mid segment but appears somewhat tortuous.   Pt meds includes: Statin:  No. Beta Blocker:  No. Aspirin:  No. ACEI:  No. ARB:  No. Other Antiplatelet/Anticoagulant:  No.    ASSESSMENT/PLAN:: 40 y.o. female with hx of left great saphenous vein ablation 02/29/12 who presents with new onset of swelling of bilateral hands and feet as well as skin color  changes and pain.   -pt does have flow in left saphenous vein, which has either redeveloped a channel or is an accessory saphenous vein.  This vein is small, but does have reflux present.  She also has some reflux in the deep system, however, the greater saphenous vein is small and an intervention is most likely not going to improve her symptoms in her legs and most certainly not in her hands.  Dr. Donnetta Hutching is recommending compression and elevation as far as the reflux is concerned.  -Dr. Donnetta Hutching suggested the pt see a rheumatologist given the symptoms she is describing and she saw Dr. Charlestine Night earlier today, who evaluated the pt.  She states that he didn't really have an answer for her symptoms either.   -by physical exam, she has normal arterial flow with easily palpable pedal pulses bilaterally.  She has easily palpable radial pulses bilaterally as well. -continue compression and elevation and we will see her back as needed if her symptoms in her legs worsen.    Leontine Locket, PA-C [facility] [phone]  Clinic MD:  Pt seen and examined in conjunction with Dr. Donnetta Hutching  I have examined the patient, reviewed and agree with above. Very confusing picture regarding her symptoms and physical findings. Patient has pictures on herself line showing these episodic areas of red splotching on her lower calves and also on her distal forearms. On venous imaging she does have flow through small vein in her thigh which appears to be a remnant of the saphenous vein. Image this with SonoSite on the right side and left side as well and this is patent. This is not enlarged and does not appear to be causing any significant venous hypertension. She does have some deep deep venous reflux. I had suggested rheumatologic evaluation and this is already been done. I do not have any thing to offer. She has no evidence of arterial insufficiency and no evidence of correctable venous insufficiency. She was reassured  with this discussion will see [place] again on as-needed basis  Curt Jews, MD 07/27/2015 4:23 PM

## 2015-08-03 ENCOUNTER — Telehealth: Payer: Self-pay | Admitting: Vascular Surgery

## 2015-08-03 NOTE — Telephone Encounter (Signed)
I asked Dr. Donnetta Hutching about this letter and he said that we would not be supportive of this based on our findings. He says that he discussed this with the patient on 07-27-15. I called Ms. Lapage and told her that based on the mild venous insufficiency documented at her office visit, we would not have any supportive evidence to issue this letter. She voiced understanding and approval of this and said" she would try another doctor".

## 2015-08-03 NOTE — Telephone Encounter (Signed)
Ms. Mound came in saying she stopped by on July 18th, 2017 asking that Dr. Donnetta Hutching write a letter for her to give to her current employer and her attorney stating due to her condition, she won't be able to sit or stand longer than two hours at a time. Please let her know when the letter is ready.  Pt's ph# [phone]  Thank you.

## 2015-08-16 ENCOUNTER — Other Ambulatory Visit: Payer: Self-pay | Admitting: Family Medicine

## 2015-08-22 ENCOUNTER — Encounter (HOSPITAL_COMMUNITY): Payer: Self-pay

## 2015-08-22 ENCOUNTER — Emergency Department (HOSPITAL_COMMUNITY)
Admission: EM | Admit: 2015-08-22 | Discharge: 2015-08-23 | Disposition: A | Discharge status: Home / Self Care | Payer: BLUE CROSS/BLUE SHIELD | Attending: Dermatology | Admitting: Dermatology

## 2015-08-22 DIAGNOSIS — R509 Fever, unspecified: Secondary | ICD-10-CM | POA: Diagnosis present

## 2015-08-22 DIAGNOSIS — R110 Nausea: Secondary | ICD-10-CM | POA: Insufficient documentation

## 2015-08-22 DIAGNOSIS — Z5321 Procedure and treatment not carried out due to patient leaving prior to being seen by health care provider: Secondary | ICD-10-CM | POA: Insufficient documentation

## 2015-08-22 NOTE — ED Triage Notes (Signed)
Pt states that Friday night she began to feel hot and nauseous. Reports diarrhea, chest pressure, increased acid reflux, and fever. Denies vomiting or LOC. A&Ox4. Ambulatory.

## 2015-08-22 NOTE — ED Notes (Signed)
Pt states she does not want to wait any longer and is going home.

## 2015-08-27 NOTE — Progress Notes (Signed)
Cardiology Office Note   Date:  08/31/2015   ID:  Dominique [name], DOB 26-Apr-1975, MRN [id number]  PCP:  Eloise Levels, NP  Cardiologist:   Jenkins Rouge, MD   Chief Complaint  Patient presents with  . New Evaluation    has some chest pain and shortness of breath      History of Present Illness: Dominique [name] is a 40 y.o. female who presents for evaluation of chest pain and SOB. She has a confusing issue with pain in her arms and legs with skin color changes I post LE venous ablation For refulx and has recently seen both the vein clinic and Dr Early who did her ablations. Given her  ? Raynauds he suggested rheumatology f/u and patient saw Dr Charlestine Night.  He considered additional Diagnosis of soft tissue gout and offerred some prednisone   Seen in ER 08/29/15 with atypical chest pain and dyspnea Pinching pain at rest and stress History of DVT in RLE  Thought pains in chest related to GERD but didn't get better.  ER evaluation negative R/O ECG no acute changes D dimer also negative   Past Medical History:  Diagnosis Date  . DVT (deep venous thrombosis) ([place])   . GERD (gastroesophageal reflux disease)   . History of recurrent UTIs   . Obesity   . Raynaud disease 12/21/2011  . Varicose veins     Past Surgical History:  Procedure Laterality Date  . ABDOMINAL HYSTERECTOMY    . APPENDECTOMY    . BALLOON DILATION N/A 04/02/2014   Procedure: BALLOON DILATION;  Surgeon: Ronald Lobo, MD;  Location: [facility];  Service: Endoscopy;  Laterality: N/A;  . CESAREAN SECTION    . COLONOSCOPY WITH PROPOFOL N/A 03/26/2012   Procedure: COLONOSCOPY WITH PROPOFOL;  Surgeon: Cleotis Nipper, MD;  Location: [facility];  Service: Endoscopy;  Laterality: N/A;  . ENDOVENOUS ABLATION SAPHENOUS VEIN W/ LASER  02-01-2012   right greater saphenous vein and stab phlebectomy 10-20 incisions right leg  by Curt Jews, MD  . ENDOVENOUS ABLATION SAPHENOUS VEIN W/ LASER Left 02-29-2012   left  greater saphenous vein and stab phlebectomy left leg 10-20 incisions  by Curt Jews MD  . ENDOVENOUS ABLATION SAPHENOUS VEIN W/ LASER Left   . ESOPHAGOGASTRODUODENOSCOPY (EGD) WITH PROPOFOL N/A 03/26/2012   Procedure: ESOPHAGOGASTRODUODENOSCOPY (EGD) WITH PROPOFOL;  Surgeon: Cleotis Nipper, MD;  [facility];  Service: Endoscopy;  Laterality: N/A;  . ESOPHAGOGASTRODUODENOSCOPY (EGD) WITH PROPOFOL N/A 04/02/2014   Procedure: ESOPHAGOGASTRODUODENOSCOPY (EGD) WITH PROPOFOL;  Surgeon: Ronald Lobo, MD;  Location: [facility];  Service: Endoscopy;  Laterality: N/A;  . HERNIA REPAIR  2006     Current Outpatient Prescriptions  Medication Sig Dispense Refill  . acetaminophen (TYLENOL) 325 MG tablet Take 325 mg by mouth every 6 (six) hours as needed for moderate pain.    Marland Kitchen amitriptyline (ELAVIL) 100 MG tablet Take 1 tablet (100 mg total) by mouth at bedtime. (Patient taking differently: Take 50 mg by mouth at bedtime. ) 45 tablet 1  . Ferrous Sulfate (IRON) 325 (65 FE) MG TABS Take 325 mg by mouth daily. 30 each 5  . gabapentin (NEURONTIN) 100 MG capsule Take 100 mg by mouth 3 (three) times daily.  5  . hydrocortisone 2.5 % ointment Apply topically 2 (two) times daily. 453 g 0  . hydrOXYzine (VISTARIL) 25 MG capsule Take 1 capsule (25 mg total) by mouth every 6 (six) hours as needed. 30 capsule 0  . meloxicam (MOBIC)  15 MG tablet TAKE ONE TABLET BY MOUTH EVERY DAY 90 tablet 0  . mupirocin ointment (BACTROBAN) 2 % Apply to both nostrils TID for 1 month. 22 g 1  . omeprazole (PRILOSEC) 40 MG capsule TAKE ONE CAPSULE BY MOUTH TWICE DAILY BEFORE MEALS 60 capsule 4  . promethazine (PHENERGAN) 12.5 MG tablet Take 1 tablet (12.5 mg total) by mouth every 6 (six) hours as needed for nausea or vomiting. 15 tablet 0  . QC LORATADINE ALLERGY RELIEF 10 MG tablet TAKE ONE TABLET BY MOUTH EVERY DAY 90 tablet 3  . traMADol (ULTRAM) 50 MG tablet Take 1 tablet (50 mg total) by mouth every 8 (eight) hours as  needed. (Patient taking differently: Take 50 mg by mouth every 6 (six) hours as needed for moderate pain or severe pain. ) 20 tablet 0  . valACYclovir (VALTREX) 1000 MG tablet Take 0.5 tablets (500 mg total) by mouth 2 (two) times daily. 7 tablet 5   No current facility-administered medications for this visit.     Allergies:   Amoxicillin; Contrast media [iodinated diagnostic agents]; Latex; Zofran [ondansetron hcl]; Doxycycline; Eggs or egg-derived products; Lactose intolerance (gi); and Nsaids    Social History:  The patient  reports that she has never smoked. She has never used smokeless tobacco. She reports that she does not drink alcohol or use drugs. Lives in [place] grew up in [employer] She works as a Training and development officer /waitress  2 daughters rate ETOH no smoking   Family History:  The patient's family history includes Deep vein thrombosis in her brother; Diabetes in her father and mother; Heart disease in her mother; Hyperlipidemia in her father and mother; Hypertension in her father and mother; Other in her brother, father, and mother; Peripheral vascular disease in her father.    ROS:  Please see the history of present illness.   Otherwise, review of systems are positive for none.   All other systems are reviewed and negative.    PHYSICAL EXAM: VS:  BP 118/80 (BP Location: Left Arm, Patient Position: Sitting, Cuff Size: Normal)   Pulse (!) 106   Ht _0  (1.397 m)   Wt 202 lb 8 oz (91.9 kg)   BMI 47.07 kg/m  , BMI Body mass index is 47.07 kg/m. Affect appropriate Overweight white female  HEENT: normal Neck supple with no adenopathy JVP normal no bruits no thyromegaly Lungs clear with no wheezing and good diaphragmatic motion Heart:  S1/S2 no murmur, no rub, gallop or click PMI normal Abdomen: benighn, BS positve, no tenderness, no AAA no bruit.  No HSM or HJR Distal pulses intact with no bruits Plus one edema Neuro non-focal Skin warm and dry erythema in palms and prominent  venous pattern in UE;s  No muscular weakness    EKG:   2/16  SR rate 73 normal ECG  08/29/15  ST rate 100 normal ECG    Recent Labs: 04/06/2015: ALT 10 08/29/2015: B Natriuretic Peptide 21.9; BUN 14; Creatinine, Ser 0.75; Hemoglobin 12.0; Platelets 551; Potassium 4.0; Sodium 137    Lipid Panel    Component Value Date/Time   CHOL 192 02/16/2012 1030   TRIG 146 02/16/2012 1030   HDL 48 02/16/2012 1030   CHOLHDL 4.0 02/16/2012 1030   VLDL 29 02/16/2012 1030   LDLCALC 115 (H) 02/16/2012 1030      Wt Readings from Last 3 Encounters:  08/31/15 202 lb 8 oz (91.9 kg)  08/22/15 200 lb (90.7 kg)  07/27/15 204 lb (92.5  kg)      Other studies Reviewed: Additional studies/ records that were reviewed today include: NOtes ER ECG CXR notes Dr Charlestine Night .    ASSESSMENT AND PLAN:  1.  Dyspnea : likely functional normal exam f/u echo  2. Chest pain atypical recurrent unable to walk on treadmill due to leg pain f/u Lexiscan myovue 3. Edema:  LEls venous reflux f/u Early  4. 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 She has seen Dr Charlestine Night and I encouraged her To have him refer her to tertiary center.  Check ESR, CRP , LA and ANA   Current medicines are reviewed at length with the patient today.  The patient does not have concerns regarding medicines.  The following changes have been made:  no change  Labs/ tests ordered today include: Echo / Lexiscan myovue   Orders Placed This Encounter  Procedures  . C-reactive protein  . ANA, IFA Comprehensive Panel  . Sed Rate (ESR)  . Systemic Lupus Profile A  . Myocardial Perfusion Imaging  . ECHOCARDIOGRAM COMPLETE     Disposition:   FU with me PRN      Signed, Jenkins Rouge, MD  08/31/2015 11:02 AM    [facility] [address], [place], [place]  [address] Phone: [phone]; Fax: [phone]

## 2015-08-29 ENCOUNTER — Emergency Department (HOSPITAL_COMMUNITY): Payer: BLUE CROSS/BLUE SHIELD

## 2015-08-29 ENCOUNTER — Emergency Department (HOSPITAL_COMMUNITY)
Admission: EM | Admit: 2015-08-29 | Discharge: 2015-08-29 | Disposition: A | Discharge status: Home / Self Care | Payer: BLUE CROSS/BLUE SHIELD | Attending: Emergency Medicine | Admitting: Emergency Medicine

## 2015-08-29 ENCOUNTER — Encounter (HOSPITAL_COMMUNITY): Payer: Self-pay

## 2015-08-29 DIAGNOSIS — R0602 Shortness of breath: Secondary | ICD-10-CM | POA: Diagnosis not present

## 2015-08-29 DIAGNOSIS — Z79899 Other long term (current) drug therapy: Secondary | ICD-10-CM | POA: Insufficient documentation

## 2015-08-29 DIAGNOSIS — R0789 Other chest pain: Secondary | ICD-10-CM

## 2015-08-29 LAB — D-DIMER, QUANTITATIVE: D-Dimer, Quant: <0.27 ug{FEU}/mL (ref 0.00–0.50)

## 2015-08-29 LAB — CBC
HEMATOCRIT: 38.5 % (ref 36.0–46.0)
HEMOGLOBIN: 12 g/dL (ref 12.0–15.0)
MCH: 27 pg (ref 26.0–34.0)
MCHC: 31.2 g/dL (ref 30.0–36.0)
MCV: 86.7 fL (ref 78.0–100.0)
Platelets: 551 10*3/uL — ABNORMAL HIGH (ref 150–400)
RBC: 4.44 MIL/uL (ref 3.87–5.11)
RDW: 16.5 % — ABNORMAL HIGH (ref 11.5–15.5)
WBC: 13 10*3/uL — ABNORMAL HIGH (ref 4.0–10.5)

## 2015-08-29 LAB — URINALYSIS, ROUTINE W REFLEX MICROSCOPIC
BILIRUBIN URINE: NEGATIVE
Glucose, UA: NEGATIVE mg/dL
Hgb urine dipstick: NEGATIVE
Ketones, ur: NEGATIVE mg/dL
Leukocytes, UA: NEGATIVE
NITRITE: NEGATIVE
PH: 7 (ref 5.0–8.0)
Protein, ur: NEGATIVE mg/dL
SPECIFIC GRAVITY, URINE: 1.016 (ref 1.005–1.030)

## 2015-08-29 LAB — BASIC METABOLIC PANEL
ANION GAP: 9 (ref 5–15)
BUN: 14 mg/dL (ref 6–20)
CO2: 24 mmol/L (ref 22–32)
Calcium: 8.8 mg/dL — ABNORMAL LOW (ref 8.9–10.3)
Chloride: 104 mmol/L (ref 101–111)
Creatinine, Ser: 0.75 mg/dL (ref 0.44–1.00)
GFR calc Af Amer: >60 mL/min (ref >60)
GLUCOSE: 106 mg/dL — AB (ref 65–99)
POTASSIUM: 4 mmol/L (ref 3.5–5.1)
Sodium: 137 mmol/L (ref 135–145)

## 2015-08-29 LAB — BRAIN NATRIURETIC PEPTIDE: B Natriuretic Peptide: 21.9 pg/mL (ref 0.0–100.0)

## 2015-08-29 LAB — I-STAT TROPONIN, ED
Troponin i, poc: 0 ng/mL (ref 0.00–0.08)
Troponin i, poc: 0 ng/mL (ref 0.00–0.08)

## 2015-08-29 MED ORDER — GI COCKTAIL ~~LOC~~
30.0000 mL | Freq: Once | ORAL | Status: AC
  Administered 2015-08-29: 30 mL via ORAL
  Filled 2015-08-29: qty 30

## 2015-08-29 NOTE — ED Notes (Signed)
Pt from home with complaints of chest pain x 3 weeks. Pt states the pain is mostly epigastric pain, but she also has a pinching feeling in her upper left chest. Pt states that the pain comes and goes, and when it comes her hands turn red and she feels nauseous. Pt states that she has a hx of reflux and has had a few esophogeal widening surgeries. Pt states that she also has felt run down and weak for about 2 weeks.

## 2015-08-29 NOTE — Discharge Instructions (Signed)
Continue taking home Prilosec for reflux. Keep scheduled appointment with your cardiologist on Tuesday. Return to the ED if you experience severe worsening of your symptoms, fevers, difficulty breathing, increased chest pain, loss of consciousness.

## 2015-08-29 NOTE — ED Provider Notes (Signed)
[place] DEPT Provider Note   CSN: [id number] Arrival date & time: 08/29/15  1639     History   Chief Complaint Chief Complaint  Patient presents with  . Weakness  . Chest Pain    HPI Dominique [name] is a 40 y.o. female the past medical history of DVT, GERD, Varicose veins who presents to the ED today with multiple complaints. Patient states that for the last 2 weeks she has been experiencing diffuse myalgias, hot flashes. Patient states she thought that she was experiencing flulike symptoms as she has been trying home remedies without relief. One week ago while shopping she felt a hot flash and became very sweaty. She states she felt associated weakness, blurry vision, and a "pinching" pain in the left side of her chest. Patient states since that time she is continued to have intermittent pinching pains left side of her chest as well as a dull achy pain in the center of her chest. Patient thought that this was her GERD that she is taking Prilosec without relief for this. She has also had worsening DOE and shortness of breath. She states that she gets winded when walking from her car to her front door. Patient states she saw her PCP on Tuesday for these symptoms, who did an EKG which was unremarkable. Patient also states that she gets numbness in her bilateral hands which is been going on for 1 year. She states in the last couple of weeks when she experiences this chest pain her hands become red and tingly. She sees a rheumatologist for these issues. She saw her rheumatologist on Thursday who referred her to a cardiologist which she is supposed to see next Tuesday. Patient has also noticed increased swelling in her bilateral lower extremities, more than normal. She has a history of a DVT in her right lower extremity when she does not take blood thinners for. She is also had multiple ablations for varicose veins. Patient came to the ED stay because she felt her chest pain and weakness was  getting worse.   HPI  Past Medical History:  Diagnosis Date  . DVT (deep venous thrombosis) ([place])   . GERD (gastroesophageal reflux disease)   . History of recurrent UTIs   . Obesity   . Raynaud disease 12/21/2011  . Varicose veins     Patient Active Problem List   Diagnosis Date Noted  . Acute upper respiratory infection 12/16/2014  . Bilateral hand pain 11/11/2014  . Dysuria 08/14/2014  . Urinary incontinence 08/14/2014  . Hematuria 08/14/2014  . Right thigh pain 08/14/2014  . UTI (urinary tract infection) 07/29/2014  . Dermatitis 07/02/2014  . Anemia, iron deficiency 03/09/2014  . Personal history of DVT (deep vein thrombosis)   . Skin lesion of breast 01/13/2014  . Fibromyalgia 12/11/2013  . Hallucinations 12/11/2013  . Total body pain 07/18/2013  . Internal hemorrhoid, bleeding 03/27/2013  . Hand swelling 12/27/2012  . 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 Cystitis, chronic 10/15/2012  . Leg swelling 06/18/2012  . Gastritis 04/02/2012  . Obesity (BMI 30-39.9) 02/05/2012  . Varicose veins of lower extremities with other complications 0000000    Past Surgical History:  Procedure Laterality Date  . ABDOMINAL HYSTERECTOMY    . APPENDECTOMY    . BALLOON DILATION N/A 04/02/2014   Procedure: BALLOON DILATION;  Surgeon: Ronald Lobo, MD;  Location: [facility];  Service: Endoscopy;  Laterality: N/A;  . CESAREAN SECTION    . COLONOSCOPY WITH PROPOFOL N/A 03/26/2012   Procedure:  COLONOSCOPY WITH PROPOFOL;  Surgeon: Cleotis Nipper, MD;  Location: [facility];  Service: Endoscopy;  Laterality: N/A;  . ENDOVENOUS ABLATION SAPHENOUS VEIN W/ LASER  02-01-2012   right greater saphenous vein and stab phlebectomy 10-20 incisions right leg  by Curt Jews, MD  . ENDOVENOUS ABLATION SAPHENOUS VEIN W/ LASER Left 02-29-2012   left greater saphenous vein and stab phlebectomy left leg 10-20 incisions  by Curt Jews MD  . ENDOVENOUS ABLATION SAPHENOUS VEIN W/ LASER Left   .  ESOPHAGOGASTRODUODENOSCOPY (EGD) WITH PROPOFOL N/A 03/26/2012   Procedure: ESOPHAGOGASTRODUODENOSCOPY (EGD) WITH PROPOFOL;  Surgeon: Cleotis Nipper, MD;  Location: [facility];  Service: Endoscopy;  Laterality: N/A;  . ESOPHAGOGASTRODUODENOSCOPY (EGD) WITH PROPOFOL N/A 04/02/2014   Procedure: ESOPHAGOGASTRODUODENOSCOPY (EGD) WITH PROPOFOL;  Surgeon: Ronald Lobo, MD;  Location: [facility];  Service: Endoscopy;  Laterality: N/A;  . HERNIA REPAIR  2006    OB History    No data available       Home Medications    Prior to Admission medications   Medication Sig Start Date End Date Taking? Authorizing Provider  acetaminophen (TYLENOL) 325 MG tablet Take 325 mg by mouth every 6 (six) hours as needed for moderate pain.   Yes Historical Provider, MD  amitriptyline (ELAVIL) 100 MG tablet Take 1 tablet (100 mg total) by mouth at bedtime. Patient taking differently: Take 50 mg by mouth at bedtime.  01/27/15  Yes Olam Idler, MD  gabapentin (NEURONTIN) 100 MG capsule Take 100 mg by mouth 3 (three) times daily. 03/25/15  Yes Historical Provider, MD  traMADol (ULTRAM) 50 MG tablet Take 1 tablet (50 mg total) by mouth every 8 (eight) hours as needed. Patient taking differently: Take 50 mg by mouth every 6 (six) hours as needed for moderate pain or severe pain.  01/08/15  Yes Mariel Aloe, MD  amoxicillin (AMOXIL) 500 MG capsule Take 1 capsule (500 mg total) by mouth 3 (three) times daily. Patient not taking: Reported on 04/07/2015 12/16/14   Archie Patten, MD  azithromycin (ZITHROMAX) 250 MG tablet Take 1 tablet (250 mg total) by mouth daily. Patient not taking: Reported on 04/07/2015 12/27/14   Domenic Moras, PA-C  chlorhexidine (PERIDEX) 0.12 % solution Use as directed 15 mLs in the mouth or throat 2 (two) times daily. Patient not taking: Reported on 04/07/2015 12/27/14   Domenic Moras, PA-C  cyclobenzaprine (FLEXERIL) 5 MG tablet Take 1 tablet (5 mg total) by mouth 3 (three) times daily as needed  (muscle pain). Patient not taking: Reported on 07/27/2015 04/07/15   Rolland Porter, MD  Diclofenac Sodium 2 % SOLN Place 1 application onto the skin 2 (two) times daily. FOR 60 TO 90 DAYS Patient not taking: Reported on 08/29/2015 11/05/14   [name], PA-C  Ferrous Sulfate (IRON) 325 (65 FE) MG TABS Take 325 mg by mouth daily. Patient not taking: Reported on 07/27/2015 03/09/14   Olam Idler, MD  fluticasone [facility]) 50 MCG/ACT nasal spray Place 2 sprays into both nostrils daily. Patient not taking: Reported on 04/07/2015 12/13/14   Melony Overly, MD  guaiFENesin 200 MG tablet Take 1 tablet (200 mg total) by mouth every 4 (four) hours as needed for cough or to loosen phlegm. Patient not taking: Reported on 08/29/2015 12/16/14   Archie Patten, MD  hydrocortisone 2.5 % ointment Apply topically 2 (two) times daily. Patient not taking: Reported on 08/29/2015 11/04/14   Elberta Leatherwood, MD  hydrOXYzine (VISTARIL) 25 MG capsule  Take 1 capsule (25 mg total) by mouth every 6 (six) hours as needed. Patient not taking: Reported on 08/29/2015 07/02/14   Olam Idler, MD  ipratropium (ATROVENT) 0.06 % nasal spray Place 2 sprays into both nostrils 4 (four) times daily. Patient not taking: Reported on 08/29/2015 12/13/14   Melony Overly, MD  meloxicam (MOBIC) 15 MG tablet TAKE ONE TABLET BY MOUTH EVERY DAY Patient not taking: Reported on 08/29/2015 03/30/15   Olam Idler, MD  mupirocin ointment (BACTROBAN) 2 % Apply to both nostrils TID for 1 month. Patient not taking: Reported on 08/29/2015 02/07/14   Harden Mo, MD  omeprazole (PRILOSEC) 40 MG capsule TAKE ONE CAPSULE BY MOUTH TWICE DAILY BEFORE MEALS Patient not taking: Reported on 08/29/2015 03/30/15   Olam Idler, MD  predniSONE (DELTASONE) 10 MG tablet 6 tabs po day 1, 5 tabs po day 2, 4 tabs po day 3, 3 tabs po day 4, 2 tabs po day 5, 1 tab po day 6 Patient not taking: Reported on 04/07/2015 11/12/14   Dene Gentry, MD  promethazine (PHENERGAN) 12.5 MG  tablet Take 1 tablet (12.5 mg total) by mouth every 6 (six) hours as needed for nausea or vomiting. Patient not taking: Reported on 08/29/2015 12/16/14   Archie Patten, MD  promethazine (PHENERGAN) 25 MG tablet Take 1 tablet (25 mg total) by mouth every 8 (eight) hours as needed for nausea or vomiting. Patient not taking: Reported on 04/07/2015 12/31/14   Olam Idler, MD  QC LORATADINE ALLERGY RELIEF 10 MG tablet TAKE ONE TABLET BY MOUTH EVERY DAY Patient not taking: Reported on 08/29/2015 02/12/15   Olam Idler, MD  valACYclovir (VALTREX) 1000 MG tablet Take 0.5 tablets (500 mg total) by mouth 2 (two) times daily. Patient not taking: Reported on 08/29/2015 12/01/14   Olam Idler, MD    Family History Family History  Problem Relation Age of Onset  . Diabetes Mother   . Heart disease Mother   . Hyperlipidemia Mother   . Hypertension Mother   . Other Mother     amputation, varicose veins  . Diabetes Father   . Hyperlipidemia Father   . Hypertension Father   . Peripheral vascular disease Father   . Other Father     varicose veins  . Deep vein thrombosis Brother   . Other Brother     varicose veins    Social History Social History  Substance Use Topics  . Smoking status: Never Smoker  . Smokeless tobacco: Never Used  . Alcohol use No     Allergies   Contrast media [iodinated diagnostic agents]; Latex; Zofran [ondansetron hcl]; Doxycycline; Eggs or egg-derived products; Lactose intolerance (gi); and Nsaids   Review of Systems Review of Systems  All other systems reviewed and are negative.    Physical Exam Updated Vital Signs BP 115/78 (BP Location: Left Arm)   Pulse 100   Temp 98.8 F (37.1 C) (Oral)   Resp 20   SpO2 96%   Physical Exam  Constitutional: She is oriented to person, place, and time. She appears well-developed and well-nourished. No distress.  HENT:  Head: Normocephalic and atraumatic.  Mouth/Throat: No oropharyngeal exudate.  Eyes:  Conjunctivae and EOM are normal. Pupils are equal, round, and reactive to light. Right eye exhibits no discharge. Left eye exhibits no discharge. No scleral icterus.  Cardiovascular: Normal rate, regular rhythm, normal heart sounds and intact distal pulses.  Exam reveals no gallop and  no friction rub.   No murmur heard. Pulmonary/Chest: Effort normal and breath sounds normal. No respiratory distress. She has no wheezes. She has no rales. She exhibits no tenderness.  Abdominal: Soft. She exhibits no distension. There is no tenderness. There is no guarding.  Musculoskeletal: Normal range of motion. She exhibits edema.  Neurological: She is alert and oriented to person, place, and time.  Strength 5/5 throughout. No sensory deficits. No gait abnormality.   Skin: Skin is warm and dry. No rash noted. She is not diaphoretic. No erythema. No pallor.  Psychiatric: She has a normal mood and affect. Her behavior is normal.  Nursing note and vitals reviewed.    ED Treatments / Results  Labs (all labs ordered are listed, but only abnormal results are displayed) Labs Reviewed  BASIC METABOLIC PANEL - Abnormal; Notable for the following:       Result Value   Glucose, Bld 106 (*)    Calcium 8.8 (*)    All other components within normal limits  CBC - Abnormal; Notable for the following:    WBC 13.0 (*)    RDW 16.5 (*)    Platelets 551 (*)    All other components within normal limits  URINALYSIS, ROUTINE W REFLEX MICROSCOPIC (NOT AT [facility])  Randolm Idol, ED    EKG  EKG Interpretation None       Radiology Dg Chest 2 View  Result Date: 08/29/2015 CLINICAL DATA:  Pain and weakness EXAM: CHEST  2 VIEW COMPARISON:  None. FINDINGS: The heart size and mediastinal contours are within normal limits. Both lungs are clear. The visualized skeletal structures are unremarkable. IMPRESSION: No active cardiopulmonary disease. Electronically Signed   By: Dorise Bullion III M.D   On: 08/29/2015 17:31     Procedures Procedures (including critical care time)  Medications Ordered in ED Medications - No data to display   Initial Impression / Assessment and Plan / ED Course  I have reviewed the triage vital signs and the nursing notes.  Pertinent labs & imaging results that were available during my care of the patient were reviewed by me and considered in my medical decision making (see chart for details).  Clinical Course    40 y.o F with  pmhx of DVT, GERD presents to the ED today with multiple complaints including CP x 2 weeks with intermittent SOB. Pt appears well in ED, in NAD. All VSS. EKG shows no sign of ischemia. Initial trop wnl. Low risk HEART score, 3. Low suspicion ACS. Delta trop also wnl. CXR unremarkable. D-dimer wnl. Doubt PE. Pt states that she ate chicken nuggets and ice cream earlier today which increased her chest pain. Pt given GI cocktail with significant symptomatic relief.CP likely related to GERD. Recommend that pt continue taking home Prilosec. Pt has scheduled appointment with cardiology next week to be evaluated for current symptoms. Return precautions outlined in patient discharge instructions.   Patient was discussed with and seen by Dr. Leonette Monarch who agrees with the treatment plan.    Final Clinical Impressions(s) / ED Diagnoses   Final diagnoses:  Atypical chest pain    New Prescriptions New Prescriptions   No medications on file     Carlos Levering, PA-C 09/01/15 [phone]

## 2015-08-29 NOTE — ED Triage Notes (Signed)
Pt c/o intermittent weakness, intermittent central chest discomfort, increased acid reflux, fever, nausea, and diaphoresis x 3 weeks.  Pain score 7/10.  Pt reports that she has an appointment w/ a cardiologist on Tuesday, August 22.  Pt reports "I have a lot of fluid in my legs, because I went up 2 shoe sizes."

## 2015-08-30 ENCOUNTER — Other Ambulatory Visit: Payer: Self-pay | Admitting: Family Medicine

## 2015-08-30 ENCOUNTER — Encounter: Payer: Self-pay | Admitting: Hematology

## 2015-08-30 NOTE — ED Provider Notes (Signed)
Medical screening examination/treatment/procedure(s) were conducted as a shared visit with non-physician practitioner(s) and myself.  I personally evaluated the patient during the encounter. Briefly, the patient is a 40 y.o. female with  pmhx of DVT, GERD presents to the ED today with multiple complaints including CP x 2 weeks with intermittent SOB, myalgias/flu-like symptoms. Chest pain is atypical, not pleuritic in nature; worsened with eating. EKG below w/o ischemic changes, or evidence of pericarditis. HEART of 3. Trop neg x 2 - doubt ACS. Dimer negative - doubt PE. Not classic for aortic dissection or esophageal perforation. GI cocktail with significant improvement of her pain.   Pt currently being followed by Rheum and Cardiologist. She is SAFE for discharge with close scheduled follow up with them.   EKG Interpretation  Date/Time:  Sunday August 29 2015 17:09:05 EDT Ventricular Rate:  100 PR Interval:    QRS Duration: 90 QT Interval:  348 QTC Calculation: 449 R Axis:   28 Text Interpretation:  Sinus tachycardia Confirmed by [facility] MD, Cesare Sumlin ([id number]) on 08/29/2015 7:09:36 PM           Fatima Blank, MD 09/01/15 [phone]

## 2015-08-31 ENCOUNTER — Encounter: Payer: Self-pay | Admitting: Hematology

## 2015-08-31 ENCOUNTER — Ambulatory Visit (INDEPENDENT_AMBULATORY_CARE_PROVIDER_SITE_OTHER): Payer: BLUE CROSS/BLUE SHIELD | Admitting: Cardiovascular Disease

## 2015-08-31 ENCOUNTER — Encounter: Payer: Self-pay | Admitting: Cardiovascular Disease

## 2015-08-31 VITALS — BP 118/80 | HR 106 | Ht <= 58 in | Wt 202.5 lb

## 2015-08-31 DIAGNOSIS — R0600 Dyspnea, unspecified: Secondary | ICD-10-CM

## 2015-08-31 DIAGNOSIS — R0789 Other chest pain: Secondary | ICD-10-CM

## 2015-08-31 LAB — C-REACTIVE PROTEIN: CRP: 2.9 mg/dL — ABNORMAL HIGH (ref <0.60)

## 2015-08-31 NOTE — Patient Instructions (Addendum)
Medication Instructions:  Your physician recommends that you continue on your current medications as directed. Please refer to the Current Medication list given to you today.  Labwork: Your physician recommends that you have lab work done today- C-reactive Protein, ANA, SED rate, and Lupus profile  You will need to go to [facility] down stairs on first floor.  Testing/Procedures: Your physician has requested that you have an echocardiogram. Echocardiography is a painless test that uses sound waves to create images of your heart. It provides your doctor with information about the size and shape of your heart and how well your heart's chambers and valves are working. This procedure takes approximately one hour. There are no restrictions for this procedure.  Your physician has requested that you have a lexiscan myoview. For further information please visit [email or web address]. Please follow instruction sheet, as given.  Follow-Up: Your physician wants you to follow-up in: 1 year with Dr. Johnsie Cancel. You will receive a reminder letter in the mail two months in advance. If you don't receive a letter, please call our office to schedule the follow-up appointment.   If you need a refill on your cardiac medications before your next appointment, please call your pharmacy.

## 2015-09-01 ENCOUNTER — Telehealth: Payer: Self-pay

## 2015-09-01 LAB — SYSTEMIC LUPUS PROFILE A
ENA SSA (RO) Ab: <0.2 AI (ref 0.0–0.9)
ENA SSB (LA) Ab: <0.2 AI (ref 0.0–0.9)

## 2015-09-01 LAB — ANA, IFA COMPREHENSIVE PANEL
Anti Nuclear Antibody(ANA): NEGATIVE
ENA SM AB SER-ACNC: NEGATIVE
SM/RNP: NEGATIVE
SSA (Ro) (ENA) Antibody, IgG: <1
SSB (La) (ENA) Antibody, IgG: <1
Scleroderma (Scl-70) (ENA) Antibody, IgG: <1
ds DNA Ab: <1 IU/mL

## 2015-09-01 LAB — SEDIMENTATION RATE: SED RATE: 16 mm/h (ref 0–20)

## 2015-09-01 MED ORDER — ASPIRIN EC 81 MG PO TBEC: 81.0000 mg | DELAYED_RELEASE_TABLET | Freq: Every day | ORAL | 3 refills | Status: DC

## 2015-09-01 NOTE — Telephone Encounter (Signed)
-----   Message from Josue Hector, MD sent at 09/01/2015  2:48 PM EDT ----- No signs of connective tissue dx CRP ujp should take baby aspirin daily

## 2015-09-07 ENCOUNTER — Telehealth (HOSPITAL_COMMUNITY): Payer: Self-pay | Admitting: *Deleted

## 2015-09-07 NOTE — Telephone Encounter (Signed)
Left message on voicemail per DPR in reference to upcoming appointment scheduled on 09/09/15 at 1230 with detailed instructions given per Myocardial Perfusion Study Information Sheet for the test. LM to arrive 15 minutes early, and that it is imperative to arrive on time for appointment to keep from having the test rescheduled. If you need to cancel or reschedule your appointment, please call the office within 24 hours of your appointment. Failure to do so may result in a cancellation of your appointment, and a $50 no show fee. Phone number given for call back for any questions.

## 2015-09-09 ENCOUNTER — Ambulatory Visit (HOSPITAL_COMMUNITY): Payer: BLUE CROSS/BLUE SHIELD | Attending: Cardiology

## 2015-09-09 ENCOUNTER — Ambulatory Visit (HOSPITAL_BASED_OUTPATIENT_CLINIC_OR_DEPARTMENT_OTHER): Payer: BLUE CROSS/BLUE SHIELD

## 2015-09-09 ENCOUNTER — Other Ambulatory Visit: Payer: Self-pay

## 2015-09-09 DIAGNOSIS — R0600 Dyspnea, unspecified: Secondary | ICD-10-CM

## 2015-09-09 DIAGNOSIS — R0789 Other chest pain: Secondary | ICD-10-CM

## 2015-09-10 ENCOUNTER — Telehealth: Payer: Self-pay | Admitting: Cardiovascular Disease

## 2015-09-10 ENCOUNTER — Ambulatory Visit (HOSPITAL_COMMUNITY): Payer: BLUE CROSS/BLUE SHIELD | Attending: Internal Medicine

## 2015-09-10 LAB — MYOCARDIAL PERFUSION IMAGING
CHL CUP NUCLEAR SDS: 5
CHL CUP RESTING HR STRESS: 78 {beats}/min
CSEPPHR: 100 {beats}/min
LV dias vol: 93 mL (ref 46–106)
LVSYSVOL: 46 mL
RATE: 0.26
SRS: 2
SSS: 7
TID: 1.63

## 2015-09-10 MED ORDER — TECHNETIUM TC 99M TETROFOSMIN IV KIT
30.5000 | PACK | Freq: Once | INTRAVENOUS | Status: AC | PRN
  Administered 2015-09-09: 31 via INTRAVENOUS
  Filled 2015-09-10: qty 31

## 2015-09-10 MED ORDER — REGADENOSON 0.4 MG/5ML IV SOLN
0.4000 mg | Freq: Once | INTRAVENOUS | Status: AC
  Administered 2015-09-09: 0.4 mg via INTRAVENOUS

## 2015-09-10 MED ORDER — TECHNETIUM TC 99M TETROFOSMIN IV KIT
31.4000 | PACK | Freq: Once | INTRAVENOUS | Status: AC | PRN
  Administered 2015-09-10: 31 via INTRAVENOUS
  Filled 2015-09-10: qty 31

## 2015-09-10 NOTE — Telephone Encounter (Signed)
Left message for patient to call back  

## 2015-09-10 NOTE — Telephone Encounter (Signed)
Patient aware of echo results. Per Dr. Johnsie Cancel, normal echo with good EF and no significant valvular heart disease. Patient verbalized understanding.

## 2015-09-10 NOTE — Telephone Encounter (Signed)
New message ° ° ° ° ° ° °Pt returning nurse call  °

## 2015-09-11 ENCOUNTER — Other Ambulatory Visit: Payer: Self-pay | Admitting: Family Medicine

## 2015-09-14 ENCOUNTER — Telehealth: Payer: Self-pay | Admitting: Cardiovascular Disease

## 2015-09-14 ENCOUNTER — Other Ambulatory Visit: Payer: Self-pay | Admitting: Family Medicine

## 2015-09-14 NOTE — Telephone Encounter (Signed)
Called patient about Myoview results. Per Dr. Johnsie Cancel, normal perfusion EF was normal by echo good. Patient verbalized understanding.

## 2015-09-14 NOTE — Telephone Encounter (Signed)
New Message  Pt voiced she is returning Pam's call.  Please follow up with pt. Thanks!

## 2015-09-16 ENCOUNTER — Other Ambulatory Visit: Payer: Self-pay | Admitting: Family Medicine

## 2015-09-17 ENCOUNTER — Other Ambulatory Visit: Payer: Self-pay | Admitting: Family Medicine

## 2015-09-18 ENCOUNTER — Other Ambulatory Visit: Payer: Self-pay | Admitting: Family Medicine

## 2015-09-21 ENCOUNTER — Other Ambulatory Visit: Payer: Self-pay | Admitting: Family Medicine

## 2015-09-28 ENCOUNTER — Ambulatory Visit (HOSPITAL_BASED_OUTPATIENT_CLINIC_OR_DEPARTMENT_OTHER): Payer: BLUE CROSS/BLUE SHIELD

## 2015-09-28 ENCOUNTER — Encounter: Payer: Self-pay | Admitting: Hematology

## 2015-09-28 ENCOUNTER — Telehealth: Payer: Self-pay | Admitting: Hematology

## 2015-09-28 ENCOUNTER — Ambulatory Visit (HOSPITAL_BASED_OUTPATIENT_CLINIC_OR_DEPARTMENT_OTHER): Payer: BLUE CROSS/BLUE SHIELD | Admitting: Hematology

## 2015-09-28 VITALS — BP 106/77 | HR 98 | Temp 98.5°F | Resp 18 | Ht <= 58 in | Wt 206.5 lb

## 2015-09-28 DIAGNOSIS — E538 Deficiency of other specified B group vitamins: Secondary | ICD-10-CM

## 2015-09-28 DIAGNOSIS — D473 Essential (hemorrhagic) thrombocythemia: Secondary | ICD-10-CM | POA: Diagnosis not present

## 2015-09-28 DIAGNOSIS — D509 Iron deficiency anemia, unspecified: Secondary | ICD-10-CM

## 2015-09-28 DIAGNOSIS — D500 Iron deficiency anemia secondary to blood loss (chronic): Secondary | ICD-10-CM

## 2015-09-28 DIAGNOSIS — D649 Anemia, unspecified: Secondary | ICD-10-CM | POA: Diagnosis not present

## 2015-09-28 DIAGNOSIS — D75839 Thrombocytosis, unspecified: Secondary | ICD-10-CM

## 2015-09-28 LAB — COMPREHENSIVE METABOLIC PANEL
ALT: 19 U/L (ref 0–55)
ANION GAP: 10 meq/L (ref 3–11)
AST: 12 U/L (ref 5–34)
Albumin: 2.8 g/dL — ABNORMAL LOW (ref 3.5–5.0)
Alkaline Phosphatase: 115 U/L (ref 40–150)
BUN: 9.3 mg/dL (ref 7.0–26.0)
CO2: 26 meq/L (ref 22–29)
Calcium: 8.8 mg/dL (ref 8.4–10.4)
Chloride: 104 mEq/L (ref 98–109)
Creatinine: 0.8 mg/dL (ref 0.6–1.1)
GLUCOSE: 90 mg/dL (ref 70–140)
POTASSIUM: 3.8 meq/L (ref 3.5–5.1)
SODIUM: 140 meq/L (ref 136–145)
TOTAL PROTEIN: 6.7 g/dL (ref 6.4–8.3)

## 2015-09-28 LAB — CBC & DIFF AND RETIC
BASO%: 0.4 % (ref 0.0–2.0)
Basophils Absolute: 0 10*3/uL (ref 0.0–0.1)
EOS%: 3.9 % (ref 0.0–7.0)
Eosinophils Absolute: 0.4 10*3/uL (ref 0.0–0.5)
HCT: 36.4 % (ref 34.8–46.6)
HGB: 11.5 g/dL — ABNORMAL LOW (ref 11.6–15.9)
Immature Retic Fract: 9.6 % (ref 1.60–10.00)
LYMPH%: 32.6 % (ref 14.0–49.7)
MCH: 27.2 pg (ref 25.1–34.0)
MCHC: 31.6 g/dL (ref 31.5–36.0)
MCV: 86.1 fL (ref 79.5–101.0)
MONO#: 0.9 10*3/uL (ref 0.1–0.9)
MONO%: 8.7 % (ref 0.0–14.0)
NEUT%: 54.4 % (ref 38.4–76.8)
NEUTROS ABS: 5.3 10*3/uL (ref 1.5–6.5)
PLATELETS: 395 10*3/uL (ref 145–400)
RBC: 4.23 10*6/uL (ref 3.70–5.45)
RDW: 16.2 % — ABNORMAL HIGH (ref 11.2–14.5)
Retic %: 1.66 % (ref 0.70–2.10)
Retic Ct Abs: 70.22 10*3/uL (ref 33.70–90.70)
WBC: 9.8 10*3/uL (ref 3.9–10.3)
lymph#: 3.2 10*3/uL (ref 0.9–3.3)

## 2015-09-28 LAB — IRON AND TIBC
%SAT: 14 % — ABNORMAL LOW (ref 21–57)
IRON: 56 ug/dL (ref 41–142)
TIBC: 386 ug/dL (ref 236–444)
UIBC: 331 ug/dL (ref 120–384)

## 2015-09-28 LAB — FERRITIN: Ferritin: 10 ng/ml (ref 9–269)

## 2015-09-28 NOTE — Progress Notes (Signed)
Marland Kitchen    HEMATOLOGY/ONCOLOGY CONSULTATION NOTE  Date of Service: 09/28/2015  Patient Care Team: Eloise Levels, NP as PCP - General (Nurse Practitioner)  CHIEF COMPLAINTS/PURPOSE OF CONSULTATION:  Thrombocytosis.  HISTORY OF PRESENTING ILLNESS:  Dominique [name] is a wonderful 40 y.o. female who has been referred to [place] by Dr .Eloise Levels, NP  for evaluation and management of thrombocytosis.  Patient has a history of fibromyalgia, long-standing dysphagia I deficiency anemia with a hemoglobin of 9.4 and a ferritin of 5 in December 2013 -EGD in March 2014 showed mild NSAID antral gastritis. Colonoscopy March 2014 with the normal limits. She has also had hemorrhoids with recurrent HEENT positive stools. She has had issues with significant dysfunctional uterine bleeding in the past and had a hysterectomy at age 30 years. Patient follows with Dr. Charlestine Night for her rheumatology cares.  She has had recent blood test with her primary care physician on 08/24/2015 that showed mild thrombocytosis of 452k told her to be anemic with hemoglobin of 4.9 with an MCV of 86 RDW of 17.3. Her platelet counts over the last year have a repeat for 418k to 551k. She was referred for further evaluation of thrombocytosis.  CBC done clinic today shows mild anemia with hemoglobin of 11.5 and MCV of 86.1 with an RDW of 16.2. Her platelet counts have normalized to 395k. WBC count is within normal limits at 9.8k.  Patient notes no overt GI bleeding at this time. Does not have her menstrual periods due to hysterectomy at age 71 due to dysfunctional uterine bleeding.  Patient notes that she has been on PPIs for 3-4 years due to NSAID gastritis. Has had issues of iron deficiency or years ago when she received IV iron replacement. Currently has been on oral iron on and off "slow fe".  No fevers no chills no night sweats or weight loss no new bone pains. Notes chronic pains from fibromyalgia.   MEDICAL HISTORY:  Past  Medical History:  Diagnosis Date  . DVT (deep venous thrombosis) ([place])   . GERD (gastroesophageal reflux disease)   . History of recurrent UTIs   . Obesity   . Raynaud disease 12/21/2011  . Varicose veins   History of previous narcotic prescription abuse  SURGICAL HISTORY: Past Surgical History:  Procedure Laterality Date  . ABDOMINAL HYSTERECTOMY    . APPENDECTOMY    . BALLOON DILATION N/A 04/02/2014   Procedure: BALLOON DILATION;  Surgeon: Ronald Lobo, MD;  Location: [facility];  Service: Endoscopy;  Laterality: N/A;  . CESAREAN SECTION    . COLONOSCOPY WITH PROPOFOL N/A 03/26/2012   Procedure: COLONOSCOPY WITH PROPOFOL;  Surgeon: Cleotis Nipper, MD;  Location: [facility];  Service: Endoscopy;  Laterality: N/A;  . ENDOVENOUS ABLATION SAPHENOUS VEIN W/ LASER  02-01-2012   right greater saphenous vein and stab phlebectomy 10-20 incisions right leg  by Curt Jews, MD  . ENDOVENOUS ABLATION SAPHENOUS VEIN W/ LASER Left 02-29-2012   left greater saphenous vein and stab phlebectomy left leg 10-20 incisions  by Curt Jews MD  . ENDOVENOUS ABLATION SAPHENOUS VEIN W/ LASER Left   . ESOPHAGOGASTRODUODENOSCOPY (EGD) WITH PROPOFOL N/A 03/26/2012   Procedure: ESOPHAGOGASTRODUODENOSCOPY (EGD) WITH PROPOFOL;  Surgeon: Cleotis Nipper, MD;  Location: [facility];  Service: Endoscopy;  Laterality: N/A;  . ESOPHAGOGASTRODUODENOSCOPY (EGD) WITH PROPOFOL N/A 04/02/2014   Procedure: ESOPHAGOGASTRODUODENOSCOPY (EGD) WITH PROPOFOL;  Surgeon: Ronald Lobo, MD;  Location: [facility];  Service: Endoscopy;  Laterality: N/A;  . HERNIA REPAIR  2006  SOCIAL HISTORY: Social History   Social History  . Marital status: Divorced    Spouse name: N/A  . Number of children: N/A  . Years of education: N/A   Occupational History  . Not on file.   Social History Main Topics  . Smoking status: Never Smoker  . Smokeless tobacco: Never Used  . Alcohol use No  . Drug use: No     Comment: former  narcotic presription abuse  . Sexual activity: Not on file   Other Topics Concern  . Not on file   Social History Narrative  . No narrative on file    FAMILY HISTORY: Family History  Problem Relation Age of Onset  . Diabetes Mother   . Heart disease Mother   . Hyperlipidemia Mother   . Hypertension Mother   . Other Mother     amputation, varicose veins  . Diabetes Father   . Hyperlipidemia Father   . Hypertension Father   . Peripheral vascular disease Father   . Other Father     varicose veins  . Deep vein thrombosis Brother   . Other Brother     varicose veins    ALLERGIES:  is allergic to amoxicillin; contrast media [iodinated diagnostic agents]; latex; zofran [ondansetron hcl]; doxycycline; eggs or egg-derived products; lactose intolerance (gi); and nsaids.  MEDICATIONS:  Current Outpatient Prescriptions  Medication Sig Dispense Refill  . acetaminophen (TYLENOL) 325 MG tablet Take 325 mg by mouth every 6 (six) hours as needed for moderate pain.    Marland Kitchen amitriptyline (ELAVIL) 50 MG tablet Take 100 mg by mouth at bedtime.  0  . aspirin EC 81 MG tablet Take 1 tablet (81 mg total) by mouth daily. 90 tablet 3  . Ferrous Sulfate (IRON) 325 (65 FE) MG TABS Take 325 mg by mouth daily. 30 each 5  . gabapentin (NEURONTIN) 100 MG capsule Take 100 mg by mouth 3 (three) times daily.  5  . hydrocortisone 2.5 % ointment Apply topically 2 (two) times daily. 453 g 0  . hydrOXYzine (VISTARIL) 25 MG capsule Take 1 capsule (25 mg total) by mouth every 6 (six) hours as needed. 30 capsule 0  . meloxicam (MOBIC) 15 MG tablet TAKE ONE TABLET BY MOUTH EVERY DAY 90 tablet 0  . mupirocin ointment (BACTROBAN) 2 % Apply to both nostrils TID for 1 month. 22 g 1  . NUCYNTA 50 MG tablet 50 mg.  0  . omeprazole (PRILOSEC) 40 MG capsule TAKE ONE CAPSULE BY MOUTH TWICE DAILY BEFORE MEALS 60 capsule 4  . promethazine (PHENERGAN) 12.5 MG tablet Take 1 tablet (12.5 mg total) by mouth every 6 (six) hours as  needed for nausea or vomiting. 15 tablet 0  . QC LORATADINE ALLERGY RELIEF 10 MG tablet TAKE ONE TABLET BY MOUTH EVERY DAY 90 tablet 3  . valACYclovir (VALTREX) 1000 MG tablet Take 0.5 tablets (500 mg total) by mouth 2 (two) times daily. 7 tablet 5   No current facility-administered medications for this visit.     REVIEW OF SYSTEMS:    10 Point review of Systems was done is negative except as noted above.  PHYSICAL EXAMINATION: ECOG PERFORMANCE STATUS: 1 - Symptomatic but completely ambulatory  . Vitals:   09/28/15 1121  BP: 106/77  Pulse: 98  Resp: 18  Temp: 98.5 F (36.9 C)   Filed Weights   09/28/15 1121  Weight: 206 lb 8 oz (93.7 kg)   .Body mass index is 48 kg/m.  GENERAL:alert, in  no acute distress and comfortable SKIN: skin color, texture, turgor are normal, no rashes or significant lesions EYES: normal, conjunctiva are pink and non-injected, sclera clear OROPHARYNX:no exudate, no erythema and lips, buccal mucosa, and tongue normal  NECK: supple, no JVD, thyroid normal size, non-tender, without nodularity LYMPH:  no palpable lymphadenopathy in the cervical, axillary or inguinal LUNGS: clear to auscultation with normal respiratory effort HEART: regular rate & rhythm,  no murmurs and no lower extremity edema ABDOMEN: abdomen soft, non-tender, normoactive bowel sounds  Musculoskeletal: no cyanosis of digits and no clubbing  PSYCH: alert & oriented x 3 with fluent speech NEURO: no focal motor/sensory deficits  LABORATORY DATA:  I have reviewed the data as listed  . CBC Latest Ref Rng & Units 09/28/2015 08/29/2015  WBC 4.0 - 10.5 K/uL 9.8 13.0(H)  Hemoglobin 12.0 - 15.0 g/dL 11.5(L) 12.0  Hematocrit 36.0 - 46.0 % 36.4 38.5  Platelets 150 - 400 K/uL 395 551(H)    . CMP Latest Ref Rng & Units 09/28/2015 08/29/2015  Glucose 65 - 99 mg/dL 90 106(H)  BUN 6 - 20 mg/dL 9.3 14  Creatinine 0.44 - 1.00 mg/dL 0.8 0.75  Sodium 135 - 145 mmol/L 140 137  Potassium 3.5 -  5.1 mmol/L 3.8 4.0  Chloride 101 - 111 mmol/L - 104  CO2 22 - 32 mmol/L 26 24  Calcium 8.9 - 10.3 mg/dL 8.8 8.8(L)  Total Protein 6.5 - 8.1 g/dL 6.7 -  Total Bilirubin 0.3 - 1.2 mg/dL <0.30 -  Alkaline Phos 38 - 126 U/L 115 -  AST 15 - 41 U/L 12 -  ALT 14 - 54 U/L 19 -   Component     Latest Ref Rng & Units 09/28/2015  Iron     41 - 142 ug/dL 56  TIBC     236 - 444 ug/dL 386  UIBC     120 - 384 ug/dL 331  %SAT     21 - 57 % 14 (L)  Ferritin     9 - 269 ng/ml 10  Vitamin B12     211 - 946 pg/mL 253     RADIOGRAPHIC STUDIES: I have personally reviewed the radiological images as listed and agreed with the findings in the report. Dg Chest 2 View  Result Date: 08/29/2015 CLINICAL DATA:  Pain and weakness EXAM: CHEST  2 VIEW COMPARISON:  None. FINDINGS: The heart size and mediastinal contours are within normal limits. Both lungs are clear. The visualized skeletal structures are unremarkable. IMPRESSION: No active cardiopulmonary disease. Electronically Signed   By: Dorise Bullion III M.D   On: 08/29/2015 17:31    ASSESSMENT & PLAN:   40 year old with  #1 Thrombocytosis Patient has had platelet counts ranging from 418k in August 2016 to 551k on 08/29/2015. Platelet counts today are within normal at 395k Given the patient's age and lack of splenomegaly this would be unlikely to represent a myeloproliferative neoplasm. Her thrombocytosis appears to be likely related to iron deficiency anemia or chronic bleeding. Also be reactive due to her underlying autoimmune condition if any.  #2 Mild anemia due to iron deficiency #3 B12 deficiency - likely due to chronic PPI use for absorption #4 iron deficiency likely due to chronic GI losses and chronic PPI use. Has required IV iron for 4 years ago. PLAN  -Given platelets have normalized by themselves -would not send out any clonal markers for thrombocytosis to rule out ET at this time due to low suspicion of this. -  Patient recommended  to to replace iron aggressively with iron polysaccharide 150 mg by mouth twice daily with orange juice. -B12 liquid sublingual 1000 g daily -B complex 1 tablet by mouth daily. -Continue follow-up with primary care physician .  Return to clinic with Dr. Irene Limbo in 2 months with CBC, CMP, ferritin, B12. If still iron deficient we'll offer IV iron.  All of the patients questions were answered with apparent satisfaction. The patient knows to call the clinic with any problems, questions or concerns.  I spent 50 minutes counseling the patient face to face. The total time spent in the appointment was 60 minutes and more than 50% was on counseling and direct patient cares.    Sullivan Lone MD [place] AAHIVMS [facility] Hematology/Oncology Physician [facility]  (Office):       [phone] (Work cell):  [phone] (Fax):           [phone]  09/28/2015 11:58 AM

## 2015-09-28 NOTE — Telephone Encounter (Signed)
Avs report and appointment schedule given to patient per 09/28/15 los. °

## 2015-09-29 LAB — VITAMIN B12: VITAMIN B 12: 253 pg/mL (ref 211–946)

## 2015-10-06 ENCOUNTER — Encounter (HOSPITAL_COMMUNITY): Payer: Self-pay

## 2015-10-06 DIAGNOSIS — Z91040 Latex allergy status: Secondary | ICD-10-CM | POA: Diagnosis not present

## 2015-10-06 DIAGNOSIS — Z7982 Long term (current) use of aspirin: Secondary | ICD-10-CM | POA: Insufficient documentation

## 2015-10-06 DIAGNOSIS — L0889 Other specified local infections of the skin and subcutaneous tissue: Secondary | ICD-10-CM | POA: Diagnosis present

## 2015-10-06 DIAGNOSIS — L959 Vasculitis limited to the skin, unspecified: Secondary | ICD-10-CM | POA: Insufficient documentation

## 2015-10-06 LAB — CBC WITH DIFFERENTIAL/PLATELET
BASOS PCT: 0 %
Basophils Absolute: 0 10*3/uL (ref 0.0–0.1)
Eosinophils Absolute: 0 10*3/uL (ref 0.0–0.7)
Eosinophils Relative: 0 %
HCT: 35.1 % — ABNORMAL LOW (ref 36.0–46.0)
Hemoglobin: 11 g/dL — ABNORMAL LOW (ref 12.0–15.0)
LYMPHS ABS: 1 10*3/uL (ref 0.7–4.0)
Lymphocytes Relative: 8 %
MCH: 26.9 pg (ref 26.0–34.0)
MCHC: 31.3 g/dL (ref 30.0–36.0)
MCV: 85.8 fL (ref 78.0–100.0)
MONOS PCT: 1 %
Monocytes Absolute: 0.2 10*3/uL (ref 0.1–1.0)
NEUTROS ABS: 10.9 10*3/uL — AB (ref 1.7–7.7)
Neutrophils Relative %: 91 %
Platelets: 372 10*3/uL (ref 150–400)
RBC: 4.09 MIL/uL (ref 3.87–5.11)
RDW: 15.7 % — AB (ref 11.5–15.5)
WBC: 12 10*3/uL — AB (ref 4.0–10.5)

## 2015-10-06 NOTE — ED Triage Notes (Signed)
Pt states that yesterday both legs started getting red and swollen, hx of cellulitis. Denies fevers.

## 2015-10-07 ENCOUNTER — Emergency Department (HOSPITAL_COMMUNITY)
Admission: EM | Admit: 2015-10-07 | Discharge: 2015-10-07 | Disposition: A | Discharge status: Home / Self Care | Payer: BLUE CROSS/BLUE SHIELD | Attending: Emergency Medicine | Admitting: Emergency Medicine

## 2015-10-07 DIAGNOSIS — L959 Vasculitis limited to the skin, unspecified: Secondary | ICD-10-CM

## 2015-10-07 LAB — COMPREHENSIVE METABOLIC PANEL
ALT: 18 U/L (ref 14–54)
ANION GAP: 12 (ref 5–15)
AST: 15 U/L (ref 15–41)
Albumin: 3.3 g/dL — ABNORMAL LOW (ref 3.5–5.0)
Alkaline Phosphatase: 85 U/L (ref 38–126)
BUN: 11 mg/dL (ref 6–20)
CHLORIDE: 106 mmol/L (ref 101–111)
CO2: 20 mmol/L — ABNORMAL LOW (ref 22–32)
Calcium: 9.1 mg/dL (ref 8.9–10.3)
Creatinine, Ser: 0.75 mg/dL (ref 0.44–1.00)
GFR calc Af Amer: >60 mL/min (ref >60)
Glucose, Bld: 170 mg/dL — ABNORMAL HIGH (ref 65–99)
POTASSIUM: 3.9 mmol/L (ref 3.5–5.1)
Sodium: 138 mmol/L (ref 135–145)
Total Bilirubin: 0.5 mg/dL (ref 0.3–1.2)
Total Protein: 6.8 g/dL (ref 6.5–8.1)

## 2015-10-07 LAB — I-STAT CG4 LACTIC ACID, ED: Lactic Acid, Venous: 0.93 mmol/L (ref 0.5–1.9)

## 2015-10-07 MED ORDER — HYDROCODONE-ACETAMINOPHEN 5-325 MG PO TABS: 1.0000 | ORAL_TABLET | ORAL | 0 refills | Status: DC | PRN

## 2015-10-07 MED ORDER — MORPHINE SULFATE (PF) 4 MG/ML IV SOLN
4.0000 mg | Freq: Once | INTRAVENOUS | Status: AC
  Administered 2015-10-07: 4 mg via INTRAVENOUS
  Filled 2015-10-07: qty 1

## 2015-10-07 MED ORDER — KETOROLAC TROMETHAMINE 30 MG/ML IJ SOLN
30.0000 mg | Freq: Once | INTRAMUSCULAR | Status: AC
  Administered 2015-10-07: 30 mg via INTRAVENOUS
  Filled 2015-10-07: qty 1

## 2015-10-07 NOTE — ED Provider Notes (Signed)
[facility] DEPT Provider Note   CSN: [id number] Arrival date & time: 10/06/15  2249  History   Chief Complaint Chief Complaint  Patient presents with  . Recurrent Skin Infections   HPI   Dominique [name] is an 40 y.o. female with history of DVT, chronic leg pain who presents to the ED for evaluation of leg pain, redness, and swelling. She states she started developing aching pain over the past 24 hrs. States the pain goes from her heels to her knees bilaterally. She states over the course of the day she has also developed a red rash on her bilateral medial ankles that she is concerned is cellulitis. She states her legs are much more swollen than usual. She states she is in 10/10 pain and this pain is much different than her typical chronic leg pain. Denies new injury or trauma. Denies fever or chills. She has not tried anything for her symptoms. Notably pt has vascular issues including history of DVT and varicose veins. She follows with vascular surgery and is being referred to the [facility] due to the complexity and unclear diagnoses of her vascular issues. She is not on any blood thinners.   Past Medical History:  Diagnosis Date  . DVT (deep venous thrombosis) ([place])   . GERD (gastroesophageal reflux disease)   . History of recurrent UTIs   . Obesity   . Raynaud disease 12/21/2011  . Varicose veins     Patient Active Problem List   Diagnosis Date Noted  . Acute upper respiratory infection 12/16/2014  . Bilateral hand pain 11/11/2014  . Dysuria 08/14/2014  . Urinary incontinence 08/14/2014  . Hematuria 08/14/2014  . Right thigh pain 08/14/2014  . UTI (urinary tract infection) 07/29/2014  . Dermatitis 07/02/2014  . Anemia, iron deficiency 03/09/2014  . Personal history of DVT (deep vein thrombosis)   . Skin lesion of breast 01/13/2014  . Fibromyalgia 12/11/2013  . Hallucinations 12/11/2013  . Total body pain 07/18/2013  . Internal hemorrhoid, bleeding 03/27/2013  .  Hand swelling 12/27/2012  . 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 Cystitis, chronic 10/15/2012  . Leg swelling 06/18/2012  . Gastritis 04/02/2012  . Obesity (BMI 30-39.9) 02/05/2012  . Varicose veins of lower extremities with other complications 0000000    Past Surgical History:  Procedure Laterality Date  . ABDOMINAL HYSTERECTOMY    . APPENDECTOMY    . BALLOON DILATION N/A 04/02/2014   Procedure: BALLOON DILATION;  Surgeon: Ronald Lobo, MD;  Location: [facility];  Service: Endoscopy;  Laterality: N/A;  . CESAREAN SECTION    . COLONOSCOPY WITH PROPOFOL N/A 03/26/2012   Procedure: COLONOSCOPY WITH PROPOFOL;  Surgeon: Cleotis Nipper, MD;  Location: [facility];  Service: Endoscopy;  Laterality: N/A;  . ENDOVENOUS ABLATION SAPHENOUS VEIN W/ LASER  02-01-2012   right greater saphenous vein and stab phlebectomy 10-20 incisions right leg  by Curt Jews, MD  . ENDOVENOUS ABLATION SAPHENOUS VEIN W/ LASER Left 02-29-2012   left greater saphenous vein and stab phlebectomy left leg 10-20 incisions  by Curt Jews MD  . ENDOVENOUS ABLATION SAPHENOUS VEIN W/ LASER Left   . ESOPHAGOGASTRODUODENOSCOPY (EGD) WITH PROPOFOL N/A 03/26/2012   Procedure: ESOPHAGOGASTRODUODENOSCOPY (EGD) WITH PROPOFOL;  Surgeon: Cleotis Nipper, MD;  Location: [facility];  Service: Endoscopy;  Laterality: N/A;  . ESOPHAGOGASTRODUODENOSCOPY (EGD) WITH PROPOFOL N/A 04/02/2014   Procedure: ESOPHAGOGASTRODUODENOSCOPY (EGD) WITH PROPOFOL;  Surgeon: Ronald Lobo, MD;  Location: [facility];  Service: Endoscopy;  Laterality: N/A;  . HERNIA REPAIR  2006    OB History    No data available       Home Medications    Prior to Admission medications   Medication Sig Start Date End Date Taking? Authorizing Provider  acetaminophen (TYLENOL) 325 MG tablet Take 325 mg by mouth every 6 (six) hours as needed for moderate pain.    Historical Provider, MD  amitriptyline (ELAVIL) 50 MG tablet Take 100 mg by mouth at  bedtime. 09/16/15   Historical Provider, MD  aspirin EC 81 MG tablet Take 1 tablet (81 mg total) by mouth daily. 09/01/15   Josue Hector, MD  Ferrous Sulfate (IRON) 325 (65 FE) MG TABS Take 325 mg by mouth daily. 03/09/14   Olam Idler, MD  gabapentin (NEURONTIN) 100 MG capsule Take 100 mg by mouth 3 (three) times daily. 03/25/15   Historical Provider, MD  hydrocortisone 2.5 % ointment Apply topically 2 (two) times daily. 11/04/14   Elberta Leatherwood, MD  hydrOXYzine (VISTARIL) 25 MG capsule Take 1 capsule (25 mg total) by mouth every 6 (six) hours as needed. 07/02/14   Olam Idler, MD  meloxicam (MOBIC) 15 MG tablet TAKE ONE TABLET BY MOUTH EVERY DAY 03/30/15   Olam Idler, MD  mupirocin ointment (BACTROBAN) 2 % Apply to both nostrils TID for 1 month. 02/07/14   Harden Mo, MD  NUCYNTA 50 MG tablet 50 mg. 09/16/15   Historical Provider, MD  omeprazole (PRILOSEC) 40 MG capsule TAKE ONE CAPSULE BY MOUTH TWICE DAILY BEFORE MEALS 03/30/15   Olam Idler, MD  promethazine (PHENERGAN) 12.5 MG tablet Take 1 tablet (12.5 mg total) by mouth every 6 (six) hours as needed for nausea or vomiting. 12/16/14   Archie Patten, MD  QC LORATADINE ALLERGY RELIEF 10 MG tablet TAKE ONE TABLET BY MOUTH EVERY DAY 02/12/15   Olam Idler, MD  valACYclovir (VALTREX) 1000 MG tablet Take 0.5 tablets (500 mg total) by mouth 2 (two) times daily. 12/01/14   Olam Idler, MD    Family History Family History  Problem Relation Age of Onset  . Diabetes Mother   . Heart disease Mother   . Hyperlipidemia Mother   . Hypertension Mother   . Other Mother     amputation, varicose veins  . Diabetes Father   . Hyperlipidemia Father   . Hypertension Father   . Peripheral vascular disease Father   . Other Father     varicose veins  . Deep vein thrombosis Brother   . Other Brother     varicose veins    Social History Social History  Substance Use Topics  . Smoking status: Never Smoker  . Smokeless tobacco: Never Used   . Alcohol use No     Allergies   Amoxicillin; Contrast media [iodinated diagnostic agents]; Latex; Zofran [ondansetron hcl]; Doxycycline; Eggs or egg-derived products; Lactose intolerance (gi); and Nsaids   Review of Systems Review of Systems 10 Systems reviewed and are negative for acute change except as noted in the HPI.  Physical Exam Updated Vital Signs BP 114/83   Pulse 106   Temp 98.4 F (36.9 C) (Oral)   Resp 18   Ht 4\' 9"  (1.448 m)   Wt 110.7 kg   SpO2 98%   BMI 52.80 kg/m   Physical Exam  Constitutional: She is oriented to person, place, and time.  Appears uncomfortable, NAD  HENT:  Right Ear: External ear normal.  Left Ear: External ear normal.  Nose:  Nose normal.  Mouth/Throat: Oropharynx is clear and moist. No oropharyngeal exudate.  Eyes: Conjunctivae are normal.  Neck: Neck supple.  Cardiovascular: Normal rate, regular rhythm, normal heart sounds and intact distal pulses.   Mildly tachycardic, low 100s  Pulmonary/Chest: Effort normal and breath sounds normal. No respiratory distress. She has no wheezes.  Abdominal: Soft. Bowel sounds are normal. She exhibits no distension. There is no tenderness. There is no rebound and no guarding.  Lymphadenopathy:    She has no cervical adenopathy.  Neurological: She is alert and oriented to person, place, and time. No cranial nerve deficit.  Skin:  Hands warm and bright red (pt states is baseline) Bilateral lower extremities with erythematous vasculitic rash. Bilateral legs diffusely tender. Pale.  2+ DP and PT bilaterally  Psychiatric: She has a normal mood and affect.  Nursing note and vitals reviewed.    ED Treatments / Results  Labs (all labs ordered are listed, but only abnormal results are displayed) Labs Reviewed  COMPREHENSIVE METABOLIC PANEL - Abnormal; Notable for the following:       Result Value   CO2 20 (*)    Glucose, Bld 170 (*)    Albumin 3.3 (*)    All other components within normal  limits  CBC WITH DIFFERENTIAL/PLATELET - Abnormal; Notable for the following:    WBC 12.0 (*)    Hemoglobin 11.0 (*)    HCT 35.1 (*)    RDW 15.7 (*)    Neutro Abs 10.9 (*)    All other components within normal limits  I-STAT CG4 LACTIC ACID, ED    EKG  EKG Interpretation None       Radiology No results found.  Procedures Procedures (including critical care time)  Medications Ordered in ED Medications  morphine 4 MG/ML injection 4 mg (4 mg Intravenous Given 10/07/15 0210)  ketorolac (TORADOL) 30 MG/ML injection 30 mg (30 mg Intravenous Given 10/07/15 0316)  morphine 4 MG/ML injection 4 mg (4 mg Intravenous Given 10/07/15 0316)     Initial Impression / Assessment and Plan / ED Course  I have reviewed the triage vital signs and the nursing notes.  Pertinent labs & imaging results that were available during my care of the patient were reviewed by me and considered in my medical decision making (see chart for details).  Clinical Course    Pt seen in conjunction with attending Dr. Betsey Holiday. Does not appear to be cellulitis. Rash appears vasculitic. Pt has long history of vascular issues, has a vascular surgeon, has upcoming consultation at [facility]. Will help with pain control tonight. Pt instructed to call vascular surgeon in the AM, hopefully can schedule a biopsy ASAP.     Pain resolved in the ED. Rx given for Norco. Instructed close vascular surgery f/u. ER return precautions given.  Final Clinical Impressions(s) / ED Diagnoses   Final diagnoses:  Vasculitis of skin    New Prescriptions Discharge Medication List as of 10/07/2015  4:13 AM    START taking these medications   Details  HYDROcodone-acetaminophen (NORCO/VICODIN) 5-325 MG tablet Take 1-2 tablets by mouth every 4 (four) hours as needed for severe pain., Starting Thu 10/07/2015, Print         Anne Ng, PA-C 10/07/15 [id number]    Orpah Greek, MD 10/08/15 [phone]

## 2015-10-07 NOTE — Discharge Instructions (Signed)
Call your vascular surgeon in the morning to schedule an appointment as soon as possible for further evaluation and possible biopsy of your rash. Return to the ER for new or worsening symptoms.

## 2015-10-07 NOTE — ED Provider Notes (Signed)
Patient presented to the ER with painful rash on both ankles. Symptoms began one day ago. Patient reports that the area is slightly itchy but mostly painful. She denies any direct injury.  Face to face Exam: HEENT - PERRLA Lungs - CTAB Heart - RRR, no M/R/G Abd - S/NT/ND Neuro - alert, oriented x3 Skin - non-blanching tender, non-palpable erythema bilateral medial lower legs at ankle  Plan: Morphology does not support diagnosis of cellulitis. Rash is symmetric in both legs, doubt DVT. There is no calf swelling or tenderness. Question vasculitis. Patient will contact her specialist that is working up her possible Raynaud's on this tomorrow morning, as I recommended she be seen in the office and have this area biopsied. She does not require any acute care other than analgesia at this time.   Orpah Greek, MD 10/07/15 [phone]

## 2015-10-08 ENCOUNTER — Telehealth: Payer: Self-pay | Admitting: *Deleted

## 2015-10-08 NOTE — Telephone Encounter (Signed)
"  I came in as a new patient on 09-28-2015.  What are my lab results?"  Results WNL.  Offered MyChart activation but at this time "I do not have access to a computer."

## 2015-10-10 MED ORDER — B COMPLEX VITAMINS PO CAPS: 1.0000 | ORAL_CAPSULE | Freq: Every day | ORAL | 3 refills | Status: AC

## 2015-10-10 MED ORDER — POLYSACCHARIDE IRON COMPLEX 150 MG PO CAPS: 150.0000 mg | ORAL_CAPSULE | Freq: Two times a day (BID) | ORAL | 3 refills | Status: DC

## 2015-10-10 MED ORDER — B-12 1000 MCG SL SUBL: 1000.0000 ug | SUBLINGUAL_TABLET | Freq: Every day | SUBLINGUAL | 3 refills | Status: DC

## 2015-10-11 ENCOUNTER — Encounter: Payer: Self-pay | Admitting: *Deleted

## 2015-10-11 NOTE — Progress Notes (Signed)
RN contacted patient per MD [facility] staff message. Informed patient to take B-12, B complex, and niferex sent to pharmacy. MD follow up/labs are scheduled. Patient verbalized understanding.

## 2015-10-17 ENCOUNTER — Encounter: Payer: Self-pay | Admitting: Hematology

## 2015-10-17 NOTE — Progress Notes (Unsigned)
Submitted authorization request for Niferex tablets Covermymeds on 10/17/2015. Status pending.

## 2015-10-20 ENCOUNTER — Encounter: Payer: Self-pay | Admitting: Hematology

## 2015-10-20 NOTE — Progress Notes (Unsigned)
Per Covermymeds website [employer] denied Niferex tablets. Left [employer] [phone] appeal information with the nurse.

## 2015-11-04 ENCOUNTER — Emergency Department (HOSPITAL_COMMUNITY)
Admission: EM | Admit: 2015-11-04 | Discharge: 2015-11-04 | Disposition: A | Discharge status: Home / Self Care | Payer: BLUE CROSS/BLUE SHIELD | Attending: Emergency Medicine | Admitting: Emergency Medicine

## 2015-11-04 ENCOUNTER — Encounter (HOSPITAL_COMMUNITY): Payer: Self-pay | Admitting: Emergency Medicine

## 2015-11-04 DIAGNOSIS — M79642 Pain in left hand: Secondary | ICD-10-CM | POA: Insufficient documentation

## 2015-11-04 DIAGNOSIS — Z7982 Long term (current) use of aspirin: Secondary | ICD-10-CM | POA: Diagnosis not present

## 2015-11-04 DIAGNOSIS — M79604 Pain in right leg: Secondary | ICD-10-CM | POA: Diagnosis not present

## 2015-11-04 DIAGNOSIS — M79605 Pain in left leg: Secondary | ICD-10-CM | POA: Diagnosis not present

## 2015-11-04 DIAGNOSIS — K146 Glossodynia: Secondary | ICD-10-CM | POA: Insufficient documentation

## 2015-11-04 DIAGNOSIS — Z91040 Latex allergy status: Secondary | ICD-10-CM | POA: Insufficient documentation

## 2015-11-04 DIAGNOSIS — M79641 Pain in right hand: Secondary | ICD-10-CM | POA: Insufficient documentation

## 2015-11-04 MED ORDER — MORPHINE SULFATE (PF) 4 MG/ML IV SOLN
4.0000 mg | Freq: Once | INTRAVENOUS | Status: AC
  Administered 2015-11-04: 4 mg via INTRAMUSCULAR
  Filled 2015-11-04: qty 1

## 2015-11-04 MED ORDER — MORPHINE SULFATE (PF) 4 MG/ML IV SOLN
4.0000 mg | Freq: Once | INTRAVENOUS | Status: DC
Start: 2015-11-04 — End: 2015-11-04

## 2015-11-04 NOTE — ED Notes (Signed)
EDP at bedside  

## 2015-11-04 NOTE — ED Notes (Signed)
Pt. Calling out for pain medication 2x. EDP made aware.

## 2015-11-04 NOTE — ED Triage Notes (Signed)
Flare up of unknown condition (Dr. Lenn Cal, rheumatology, is trying to figure it out). Right hand, mouth, and left leg have redness and a burning sensation. Needs help with pain control today.

## 2015-11-04 NOTE — ED Provider Notes (Signed)
[facility] DEPT Provider Note   CSN: [id number] Arrival date & time: 11/04/15  1706     History   Chief Complaint Chief Complaint  Patient presents with  . Extremity Pain    HPI Dominique [name] is a 40 y.o. female.  HPI   Patient is a 40 year old female with a history of DVT, GERD, obesity, Raynaud's, fibromyalgia, vasculitis who presents the emergency department with a "flareup". Patient states she has flares of bilateral lower extremity pain, bilateral hand pain, redness and dryness. She states her last flare was roughly 1 month ago. She states symptoms are the same as previous episodes. She states the pain in her hands and her legs as sharp, constant, 8/10. Pain her bilateral low extremities is only from the knees down with associated intermittent swelling. Patient states a burning sensation of her tongue. That is new for her with these flareups. Associated nausea. Patient has been seen by a rheumatologist in the past and she has an appointment with Dr. Lenn Cal, rheumatology, this coming Tuesday. Patient states with this episode she has had intermittent blurred vision, slurred speech, fatigue. She states she had these symptoms 2 days ago but they have since resolved. When asked the patient what I could do for today she asked me to help her with pain control. Patient denies chest pain, shortness of breath, dizziness, syncope, new onset numbness or tingling, weakness, fever, chills, recent travel, hormone use, recent surgery increased redness of her calves. Pt took her Nucynta without relief.    Past Medical History:  Diagnosis Date  . DVT (deep venous thrombosis) ([place])   . GERD (gastroesophageal reflux disease)   . History of recurrent UTIs   . Obesity   . Raynaud disease 12/21/2011  . Varicose veins     Patient Active Problem List   Diagnosis Date Noted  . Acute upper respiratory infection 12/16/2014  . Bilateral hand pain 11/11/2014  . Dysuria 08/14/2014  . Urinary  incontinence 08/14/2014  . Hematuria 08/14/2014  . Right thigh pain 08/14/2014  . UTI (urinary tract infection) 07/29/2014  . Dermatitis 07/02/2014  . Anemia, iron deficiency 03/09/2014  . Personal history of DVT (deep vein thrombosis)   . Skin lesion of breast 01/13/2014  . Fibromyalgia 12/11/2013  . Hallucinations 12/11/2013  . Total body pain 07/18/2013  . Internal hemorrhoid, bleeding 03/27/2013  . Hand swelling 12/27/2012  . 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 Cystitis, chronic 10/15/2012  . Leg swelling 06/18/2012  . Gastritis 04/02/2012  . Obesity (BMI 30-39.9) 02/05/2012  . Varicose veins of lower extremities with other complications 0000000    Past Surgical History:  Procedure Laterality Date  . ABDOMINAL HYSTERECTOMY    . APPENDECTOMY    . BALLOON DILATION N/A 04/02/2014   Procedure: BALLOON DILATION;  Surgeon: Ronald Lobo, MD;  Location: [facility];  Service: Endoscopy;  Laterality: N/A;  . CESAREAN SECTION    . COLONOSCOPY WITH PROPOFOL N/A 03/26/2012   Procedure: COLONOSCOPY WITH PROPOFOL;  Surgeon: Cleotis Nipper, MD;  Location: [facility];  Service: Endoscopy;  Laterality: N/A;  . ENDOVENOUS ABLATION SAPHENOUS VEIN W/ LASER  02-01-2012   right greater saphenous vein and stab phlebectomy 10-20 incisions right leg  by Curt Jews, MD  . ENDOVENOUS ABLATION SAPHENOUS VEIN W/ LASER Left 02-29-2012   left greater saphenous vein and stab phlebectomy left leg 10-20 incisions  by Curt Jews MD  . ENDOVENOUS ABLATION SAPHENOUS VEIN W/ LASER Left   . ESOPHAGOGASTRODUODENOSCOPY (EGD) WITH PROPOFOL N/A 03/26/2012  Procedure: ESOPHAGOGASTRODUODENOSCOPY (EGD) WITH PROPOFOL;  Surgeon: Cleotis Nipper, MD;  Location: [facility];  Service: Endoscopy;  Laterality: N/A;  . ESOPHAGOGASTRODUODENOSCOPY (EGD) WITH PROPOFOL N/A 04/02/2014   Procedure: ESOPHAGOGASTRODUODENOSCOPY (EGD) WITH PROPOFOL;  Surgeon: Ronald Lobo, MD;  Location: [facility];  Service: Endoscopy;   Laterality: N/A;  . HERNIA REPAIR  2006    OB History    No data available       Home Medications    Prior to Admission medications   Medication Sig Start Date End Date Taking? Authorizing Provider  acetaminophen (TYLENOL) 325 MG tablet Take 325 mg by mouth every 6 (six) hours as needed for moderate pain.   Yes Historical Provider, MD  amitriptyline (ELAVIL) 50 MG tablet Take 100 mg by mouth at bedtime. 09/16/15  Yes Historical Provider, MD  b complex vitamins capsule Take 1 capsule by mouth daily. 10/10/15  Yes Brunetta Genera, MD  Ferrous Sulfate (IRON) 325 (65 FE) MG TABS Take 325 mg by mouth daily. 03/09/14  Yes Olam Idler, MD  gabapentin (NEURONTIN) 100 MG capsule Take 100 mg by mouth 3 (three) times daily. 03/25/15  Yes Historical Provider, MD  hydrocortisone 2.5 % ointment Apply topically 2 (two) times daily. 11/04/14  Yes [facility] Leatherwood, MD  NUCYNTA 50 MG tablet Take 50 mg by mouth every 6 (six) hours as needed for severe pain.  09/16/15  Yes Historical Provider, MD  omeprazole (PRILOSEC) 40 MG capsule TAKE ONE CAPSULE BY MOUTH TWICE DAILY BEFORE MEALS 03/30/15  Yes Olam Idler, MD  promethazine (PHENERGAN) 12.5 MG tablet Take 1 tablet (12.5 mg total) by mouth every 6 (six) hours as needed for nausea or vomiting. 12/16/14  Yes Archie Patten, MD  valACYclovir (VALTREX) 1000 MG tablet Take 0.5 tablets (500 mg total) by mouth 2 (two) times daily. Patient taking differently: Take 500 mg by mouth 2 (two) times daily as needed (for flareups).  12/01/14  Yes Olam Idler, MD  aspirin EC 81 MG tablet Take 1 tablet (81 mg total) by mouth daily. Patient not taking: Reported on 11/04/2015 09/01/15   Josue Hector, MD  Cyanocobalamin (B-12) 1000 MCG SUBL Place 1,000 mcg under the tongue daily. Patient not taking: Reported on 11/04/2015 10/10/15   Brunetta Genera, MD  HYDROcodone-acetaminophen (NORCO/VICODIN) 5-325 MG tablet Take 1-2 tablets by mouth every 4 (four) hours as needed  for severe pain. Patient not taking: Reported on 11/04/2015 10/07/15   Olivia Canter Sam, PA-C  hydrOXYzine (VISTARIL) 25 MG capsule Take 1 capsule (25 mg total) by mouth every 6 (six) hours as needed. Patient not taking: Reported on 11/04/2015 07/02/14   Olam Idler, MD  iron polysaccharides (NIFEREX) 150 MG capsule Take 1 capsule (150 mg total) by mouth 2 (two) times daily. Patient not taking: Reported on 11/04/2015 10/10/15   Brunetta Genera, MD  meloxicam [facility]) 15 MG tablet TAKE ONE TABLET BY MOUTH EVERY DAY 03/30/15   Olam Idler, MD  mupirocin ointment (BACTROBAN) 2 % Apply to both nostrils TID for 1 month. Patient not taking: Reported on 11/04/2015 02/07/14   Harden Mo, MD  QC LORATADINE ALLERGY RELIEF 10 MG tablet TAKE ONE TABLET BY MOUTH EVERY DAY Patient not taking: Reported on 11/04/2015 02/12/15   Olam Idler, MD    Family History Family History  Problem Relation Age of Onset  . Diabetes Mother   . Heart disease Mother   . Hyperlipidemia Mother   . Hypertension  Mother   . Other Mother     amputation, varicose veins  . Diabetes Father   . Hyperlipidemia Father   . Hypertension Father   . Peripheral vascular disease Father   . Other Father     varicose veins  . Deep vein thrombosis Brother   . Other Brother     varicose veins    Social History Social History  Substance Use Topics  . Smoking status: Never Smoker  . Smokeless tobacco: Never Used  . Alcohol use No     Allergies   Contrast media [iodinated diagnostic agents]; Latex; Nsaids; Doxycycline; Eggs or egg-derived products; Lactose intolerance (gi); and Zofran [ondansetron hcl]   Review of Systems Review of Systems  Constitutional: Negative for chills and fever.  HENT: Negative for ear pain, facial swelling, hearing loss, sore throat, trouble swallowing and voice change.        Tongue pain  Eyes: Positive for visual disturbance.  Respiratory: Negative for chest tightness, shortness of breath  and wheezing.   Cardiovascular: Positive for leg swelling. Negative for chest pain.  Gastrointestinal: Negative for abdominal pain, constipation, nausea and vomiting.  Genitourinary: Negative for dysuria and hematuria.  Musculoskeletal: Negative for back pain and neck pain.  Skin: Positive for color change.  Neurological: Negative for dizziness, syncope, weakness and headaches.     Physical Exam Updated Vital Signs BP 118/91   Pulse 92   Temp 98.2 F (36.8 C) (Oral)   Resp 19   Ht 4\' 7"  (1.397 m)   Wt 94.3 kg   SpO2 99%   BMI 48.34 kg/m   Physical Exam  Constitutional: She appears well-developed and well-nourished. No distress.  HENT:  Head: Normocephalic and atraumatic.  Mouth/Throat: Uvula is midline, oropharynx is clear and moist and mucous membranes are normal. No oral lesions. No trismus in the jaw. No uvula swelling. No posterior oropharyngeal edema or posterior oropharyngeal erythema.  Eyes: Conjunctivae are normal. Pupils are equal, round, and reactive to light.  Cardiovascular: Normal rate, regular rhythm and normal heart sounds.  Exam reveals no gallop and no friction rub.   No murmur heard. Pulses:      Radial pulses are 2+ on the right side, and 2+ on the left side.       Dorsalis pedis pulses are 2+ on the right side, and 2+ on the left side.  Pulmonary/Chest: Effort normal. No respiratory distress.  Abdominal: Soft. Normal appearance and bowel sounds are normal. There is no tenderness.  Musculoskeletal: Normal range of motion. She exhibits edema (mild edema BLE).  Neurological: She is alert. She has normal strength. No sensory deficit. Coordination normal. GCS eye subscore is 4. GCS verbal subscore is 5. GCS motor subscore is 6.  Skin: Skin is warm and dry. She is not diaphoretic.  Erythema and xeroderma noted to bilateral hands, right greater than left, increased warmth, brisk cap refill, sensation intact, full ROM of BUE, patient is neurovascularly intact  distally.   Psychiatric: She has a normal mood and affect. Her behavior is normal.  Nursing note and vitals reviewed.   Right hand    ED Treatments / Results  Labs (all labs ordered are listed, but only abnormal results are displayed) Labs Reviewed - No data to display  EKG  EKG Interpretation None       Radiology No results found.  Procedures Procedures (including critical care time)  Medications Ordered in ED Medications  morphine 4 MG/ML injection 4 mg (not administered)  morphine  4 MG/ML injection 4 mg (4 mg Intramuscular Given 11/04/15 2043)     Initial Impression / Assessment and Plan / ED Course  I have reviewed the triage vital signs and the nursing notes.  Pertinent labs & imaging results that were available during my care of the patient were reviewed by me and considered in my medical decision making (see chart for details).  Clinical Course   Patient with symptoms similar to her normal "flareups". Patient afebrile, VSS, no acute distress, well-appearing. Exam and morphology does not support a diagnosis of cellulitis. 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 Patient had leg pain but no obvious rash noted or significant color change. Only mild edema noted to bilateral lower extremity. No indication for further workup at this time. Patient was requesting help with pain control. After review of the [place] Controlled Substance Reporting System Patient Dominique [name] for her pain. I informed the patient I could not prescribe her any additional narcotics for her pain control. Patient was given morphine in the ED. Patient was understanding of this. I instructed the patient to follow-up with her primary care provider tomorrow morning to discuss pain control and to keep her rheumatology appointment next week. I discussed strict return precautions and the ED. Patient expressed understanding to the discharge  instructions.  Pt case discussed with Dr. Regenia Skeeter who agrees with the above plan  Final Clinical Impressions(s) / ED Diagnoses   Final diagnoses:  Pain in both lower extremities  Bilateral hand pain  Tongue pain    New Prescriptions New Prescriptions   No medications on file     Kalman Drape, [place] 11/04/15 [place], [place] 11/04/15 2314    Sherwood Gambler, MD 11/06/15 [phone]

## 2015-11-04 NOTE — Discharge Instructions (Signed)
Take your Nucynta as prescribed and take tylenol as needed for break through pain. Keep the appointment with your rheumatologist for next Tuesday. Follow-up with your primary care provider tomorrow morning to discuss proper pain management.  Return immediately to the emergency department if you experience worsening pain, worsening swelling, swelling of your lips tongue or throat, difficulty swallowing, chest pain, shortness of breath or any other concerning symptoms.

## 2015-11-24 ENCOUNTER — Emergency Department (HOSPITAL_COMMUNITY)
Admission: EM | Admit: 2015-11-24 | Discharge: 2015-11-24 | Disposition: A | Discharge status: Home / Self Care | Payer: BLUE CROSS/BLUE SHIELD | Attending: Emergency Medicine | Admitting: Emergency Medicine

## 2015-11-24 DIAGNOSIS — Z79899 Other long term (current) drug therapy: Secondary | ICD-10-CM | POA: Insufficient documentation

## 2015-11-24 DIAGNOSIS — M549 Dorsalgia, unspecified: Secondary | ICD-10-CM | POA: Diagnosis present

## 2015-11-24 DIAGNOSIS — R21 Rash and other nonspecific skin eruption: Secondary | ICD-10-CM

## 2015-11-24 DIAGNOSIS — M2550 Pain in unspecified joint: Secondary | ICD-10-CM | POA: Insufficient documentation

## 2015-11-24 DIAGNOSIS — Z7982 Long term (current) use of aspirin: Secondary | ICD-10-CM | POA: Diagnosis not present

## 2015-11-24 DIAGNOSIS — Z91040 Latex allergy status: Secondary | ICD-10-CM | POA: Insufficient documentation

## 2015-11-24 LAB — CBC WITH DIFFERENTIAL/PLATELET
Basophils Absolute: 0 10*3/uL (ref 0.0–0.1)
Basophils Relative: 0 %
EOS PCT: 2 %
Eosinophils Absolute: 0.2 10*3/uL (ref 0.0–0.7)
HEMATOCRIT: 33.5 % — AB (ref 36.0–46.0)
Hemoglobin: 10.7 g/dL — ABNORMAL LOW (ref 12.0–15.0)
LYMPHS ABS: 2.2 10*3/uL (ref 0.7–4.0)
LYMPHS PCT: 24 %
MCH: 26.6 pg (ref 26.0–34.0)
MCHC: 31.9 g/dL (ref 30.0–36.0)
MCV: 83.1 fL (ref 78.0–100.0)
MONO ABS: 0.8 10*3/uL (ref 0.1–1.0)
Monocytes Relative: 9 %
NEUTROS ABS: 5.8 10*3/uL (ref 1.7–7.7)
Neutrophils Relative %: 65 %
PLATELETS: 444 10*3/uL — AB (ref 150–400)
RBC: 4.03 MIL/uL (ref 3.87–5.11)
RDW: 15.9 % — AB (ref 11.5–15.5)
WBC: 9 10*3/uL (ref 4.0–10.5)

## 2015-11-24 LAB — COMPREHENSIVE METABOLIC PANEL
ALT: 27 U/L (ref 14–54)
AST: 29 U/L (ref 15–41)
Albumin: 2.9 g/dL — ABNORMAL LOW (ref 3.5–5.0)
Alkaline Phosphatase: 101 U/L (ref 38–126)
Anion gap: 11 (ref 5–15)
BILIRUBIN TOTAL: 0.3 mg/dL (ref 0.3–1.2)
CHLORIDE: 101 mmol/L (ref 101–111)
CO2: 25 mmol/L (ref 22–32)
CREATININE: 0.86 mg/dL (ref 0.44–1.00)
Calcium: 8.9 mg/dL (ref 8.9–10.3)
Glucose, Bld: 127 mg/dL — ABNORMAL HIGH (ref 65–99)
Potassium: 3.6 mmol/L (ref 3.5–5.1)
Sodium: 137 mmol/L (ref 135–145)
TOTAL PROTEIN: 6.2 g/dL — AB (ref 6.5–8.1)

## 2015-11-24 MED ORDER — METHYLPREDNISOLONE SODIUM SUCC 125 MG IJ SOLR
125.0000 mg | Freq: Once | INTRAMUSCULAR | Status: AC
  Administered 2015-11-24: 125 mg via INTRAVENOUS
  Filled 2015-11-24: qty 2

## 2015-11-24 MED ORDER — METOCLOPRAMIDE HCL 5 MG/ML IJ SOLN
10.0000 mg | Freq: Once | INTRAMUSCULAR | Status: AC
  Administered 2015-11-24: 10 mg via INTRAVENOUS
  Filled 2015-11-24: qty 2

## 2015-11-24 MED ORDER — PREDNISONE 20 MG PO TABS: ORAL_TABLET | ORAL | 0 refills | Status: DC

## 2015-11-24 MED ORDER — HYDROMORPHONE HCL 2 MG/ML IJ SOLN
1.0000 mg | Freq: Once | INTRAMUSCULAR | Status: AC
  Administered 2015-11-24: 1 mg via INTRAVENOUS
  Filled 2015-11-24: qty 1

## 2015-11-24 MED ORDER — OXYCODONE-ACETAMINOPHEN 5-325 MG PO TABS: 1.0000 | ORAL_TABLET | ORAL | 0 refills | Status: DC | PRN

## 2015-11-24 NOTE — ED Provider Notes (Addendum)
[facility] DEPT Provider Note   CSN: [id number] Arrival date & time: 11/24/15  0300     History   Chief Complaint Chief Complaint  Patient presents with  . Rash  . Back Pain    HPI Dominique [name] is a 40 y.o. female.  Patient presents to the emergency department for evaluation of ongoing issues with an unknown illness that causes erythema and swelling. Patient has been seeing a rheumatologist for this. She has not had a firm diagnosis. She is being worked up by her primary doctor as well. Patient reports that she has had ongoing issues of pain and swelling of her joints secondary to this. Intermittently her extremities become red, swollen and warm to the touch. She reports that she has had increased pain and swelling overnight.      Past Medical History:  Diagnosis Date  . DVT (deep venous thrombosis) ([place])   . GERD (gastroesophageal reflux disease)   . History of recurrent UTIs   . Obesity   . Raynaud disease 12/21/2011  . Varicose veins     Patient Active Problem List   Diagnosis Date Noted  . Acute upper respiratory infection 12/16/2014  . Bilateral hand pain 11/11/2014  . Dysuria 08/14/2014  . Urinary incontinence 08/14/2014  . Hematuria 08/14/2014  . Right thigh pain 08/14/2014  . UTI (urinary tract infection) 07/29/2014  . Dermatitis 07/02/2014  . Anemia, iron deficiency 03/09/2014  . Personal history of DVT (deep vein thrombosis)   . Skin lesion of breast 01/13/2014  . Fibromyalgia 12/11/2013  . Hallucinations 12/11/2013  . Total body pain 07/18/2013  . Internal hemorrhoid, bleeding 03/27/2013  . Hand swelling 12/27/2012  . 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 Cystitis, chronic 10/15/2012  . Leg swelling 06/18/2012  . Gastritis 04/02/2012  . Obesity (BMI 30-39.9) 02/05/2012  . Varicose veins of lower extremities with other complications 0000000    Past Surgical History:  Procedure Laterality Date  . ABDOMINAL HYSTERECTOMY    .  APPENDECTOMY    . BALLOON DILATION N/A 04/02/2014   Procedure: BALLOON DILATION;  Surgeon: Ronald Lobo, MD;  [facility];  Service: Endoscopy;  Laterality: N/A;  . CESAREAN SECTION    . COLONOSCOPY WITH PROPOFOL N/A 03/26/2012   Procedure: COLONOSCOPY WITH PROPOFOL;  Surgeon: Cleotis Nipper, MD;  Location: [facility];  Service: Endoscopy;  Laterality: N/A;  . ENDOVENOUS ABLATION SAPHENOUS VEIN W/ LASER  02-01-2012   right greater saphenous vein and stab phlebectomy 10-20 incisions right leg  by Curt Jews, MD  . ENDOVENOUS ABLATION SAPHENOUS VEIN W/ LASER Left 02-29-2012   left greater saphenous vein and stab phlebectomy left leg 10-20 incisions  by Curt Jews MD  . ENDOVENOUS ABLATION SAPHENOUS VEIN W/ LASER Left   . ESOPHAGOGASTRODUODENOSCOPY (EGD) WITH PROPOFOL N/A 03/26/2012   Procedure: ESOPHAGOGASTRODUODENOSCOPY (EGD) WITH PROPOFOL;  Surgeon: Cleotis Nipper, MD;  Location: [facility];  Service: Endoscopy;  Laterality: N/A;  . ESOPHAGOGASTRODUODENOSCOPY (EGD) WITH PROPOFOL N/A 04/02/2014   Procedure: ESOPHAGOGASTRODUODENOSCOPY (EGD) WITH PROPOFOL;  Surgeon: Ronald Lobo, MD;  Location: [facility];  Service: Endoscopy;  Laterality: N/A;  . HERNIA REPAIR  2006    OB History    No data available       Home Medications    Prior to Admission medications   Medication Sig Start Date End Date Taking? Authorizing Provider  acetaminophen (TYLENOL) 325 MG tablet Take 325 mg by mouth every 6 (six) hours as needed for moderate pain.    Historical Provider,  MD  amitriptyline (ELAVIL) 50 MG tablet Take 100 mg by mouth at bedtime. 09/16/15   Historical Provider, MD  aspirin EC 81 MG tablet Take 1 tablet (81 mg total) by mouth daily. Patient not taking: Reported on 11/04/2015 09/01/15   Josue Hector, MD  b complex vitamins capsule Take 1 capsule by mouth daily. 10/10/15   Brunetta Genera, MD  Cyanocobalamin (B-12) 1000 MCG SUBL Place 1,000 mcg under the tongue  daily. Patient not taking: Reported on 11/04/2015 10/10/15   Brunetta Genera, MD  Ferrous Sulfate (IRON) 325 (65 FE) MG TABS Take 325 mg by mouth daily. 03/09/14   Olam Idler, MD  gabapentin (NEURONTIN) 100 MG capsule Take 100 mg by mouth 3 (three) times daily. 03/25/15   Historical Provider, MD  HYDROcodone-acetaminophen (NORCO/VICODIN) 5-325 MG tablet Take 1-2 tablets by mouth every 4 (four) hours as needed for severe pain. Patient not taking: Reported on 11/04/2015 10/07/15   Olivia Canter Sam, PA-C  hydrocortisone 2.5 % ointment Apply topically 2 (two) times daily. 11/04/14   [facility] Leatherwood, MD  hydrOXYzine (VISTARIL) 25 MG capsule Take 1 capsule (25 mg total) by mouth every 6 (six) hours as needed. Patient not taking: Reported on 11/04/2015 07/02/14   Olam Idler, MD  iron polysaccharides (NIFEREX) 150 MG capsule Take 1 capsule (150 mg total) by mouth 2 (two) times daily. Patient not taking: Reported on 11/04/2015 10/10/15   Brunetta Genera, MD  meloxicam [facility]) 15 MG tablet TAKE ONE TABLET BY MOUTH EVERY DAY 03/30/15   Olam Idler, MD  mupirocin ointment (BACTROBAN) 2 % Apply to both nostrils TID for 1 month. Patient not taking: Reported on 11/04/2015 02/07/14   Harden Mo, MD  NUCYNTA 50 MG tablet Take 50 mg by mouth every 6 (six) hours as needed for severe pain.  09/16/15   Historical Provider, MD  omeprazole (PRILOSEC) 40 MG capsule TAKE ONE CAPSULE BY MOUTH TWICE DAILY BEFORE MEALS 03/30/15   Olam Idler, MD  promethazine (PHENERGAN) 12.5 MG tablet Take 1 tablet (12.5 mg total) by mouth every 6 (six) hours as needed for nausea or vomiting. 12/16/14   Archie Patten, MD  QC LORATADINE ALLERGY RELIEF 10 MG tablet TAKE ONE TABLET BY MOUTH EVERY DAY Patient not taking: Reported on 11/04/2015 02/12/15   Olam Idler, MD  valACYclovir (VALTREX) 1000 MG tablet Take 0.5 tablets (500 mg total) by mouth 2 (two) times daily. Patient taking differently: Take 500 mg by mouth 2 (two) times  daily as needed (for flareups).  12/01/14   Olam Idler, MD    Family History Family History  Problem Relation Age of Onset  . Diabetes Mother   . Heart disease Mother   . Hyperlipidemia Mother   . Hypertension Mother   . Other Mother     amputation, varicose veins  . Diabetes Father   . Hyperlipidemia Father   . Hypertension Father   . Peripheral vascular disease Father   . Other Father     varicose veins  . Deep vein thrombosis Brother   . Other Brother     varicose veins    Social History Social History  Substance Use Topics  . Smoking status: Never Smoker  . Smokeless tobacco: Never Used  . Alcohol use No     Allergies   Contrast media [iodinated diagnostic agents]; Latex; Nsaids; Doxycycline; Eggs or egg-derived products; Lactose intolerance (gi); and Zofran [ondansetron hcl]   Review  of Systems Review of Systems  HENT:       Dry mouth  Musculoskeletal: Positive for arthralgias.  Skin: Positive for rash.  All other systems reviewed and are negative.    Physical Exam Updated Vital Signs BP 98/64   Pulse 115   Temp 99 F (37.2 C) (Oral)   Resp 11   SpO2 94%   Physical Exam  Constitutional: She is oriented to person, place, and time. She appears well-developed and well-nourished. No distress.  HENT:  Head: Normocephalic and atraumatic.  Right Ear: Hearing normal.  Left Ear: Hearing normal.  Nose: Nose normal.  Mouth/Throat: Oropharynx is clear and moist and mucous membranes are normal.  Eyes: Conjunctivae and EOM are normal. Pupils are equal, round, and reactive to light.  Neck: Normal range of motion. Neck supple.  Cardiovascular: Regular rhythm, S1 normal and S2 normal.  Exam reveals no gallop and no friction rub.   No murmur heard. Pulmonary/Chest: Effort normal and breath sounds normal. No respiratory distress. She exhibits no tenderness.  Abdominal: Soft. Normal appearance and bowel sounds are normal. There is no hepatosplenomegaly. There  is no tenderness. There is no rebound, no guarding, no tenderness at McBurney's point and negative Murphy's sign. No hernia.  Musculoskeletal: Normal range of motion.  Range of motion of all joints. Patient has normal capillary refill of fingers and toes. She has 2+ radial pulses bilaterally. 2+ dorsalis pedal pulses bilaterally  Neurological: She is alert and oriented to person, place, and time. She has normal strength. No cranial nerve deficit or sensory deficit. Coordination normal. GCS eye subscore is 4. GCS verbal subscore is 5. GCS motor subscore is 6.  Skin: Skin is warm, dry and intact. Capillary refill takes less than 2 seconds. No rash noted. No cyanosis.  Mild erythema of both hands.  Psychiatric: She has a normal mood and affect. Her speech is normal and behavior is normal. Thought content normal.  Nursing note and vitals reviewed.    ED Treatments / Results  Labs (all labs ordered are listed, but only abnormal results are displayed) Labs Reviewed  COMPREHENSIVE METABOLIC PANEL - Abnormal; Notable for the following:       Result Value   Glucose, Bld 127 (*)    BUN <5 (*)    Total Protein 6.2 (*)    Albumin 2.9 (*)    All other components within normal limits  CBC WITH DIFFERENTIAL/PLATELET    EKG  EKG Interpretation None       Radiology No results found.  Procedures Procedures (including critical care time)  Medications Ordered in ED Medications  methylPREDNISolone sodium succinate (SOLU-MEDROL) 125 mg/2 mL injection 125 mg (not administered)  HYDROmorphone (DILAUDID) injection 1 mg (not administered)  HYDROmorphone (DILAUDID) injection 1 mg (1 mg Intravenous Given 11/24/15 0605)  metoCLOPramide (REGLAN) injection 10 mg (10 mg Intravenous Given 11/24/15 0605)     Initial Impression / Assessment and Plan / ED Course  I have reviewed the triage vital signs and the nursing notes.  Pertinent labs & imaging results that were available during my care of the  patient were reviewed by me and considered in my medical decision making (see chart for details).  Clinical Course    Presents with "flare up" of undiagnosed rheumatologic illness. Patient has previously been diagnosed with fibromyalgia. She has had negative lupus studies. Ongoing treatment and diagnostics by her rheumatologist have not determined the cause of her illness. She experiences intermittent joint swelling, extremity swelling, erythema. This causes  her great pain. She sees a pain specialist for this, has been placed on Nucynta. Her pain has significantly worsened. There is erythema of distal extremities but she has normal blood flow and capillary refill. No sign of infection. No discrete rash other than the erythema. Patient complaining of swelling but due to her body habitus this is difficult to identify.  I did review her previous records. Previous rheumatologic studies have been negative. Patient does not appear to have any life-threatening illness at this time. Informed her that even basic labs to ensure there are no acute abnormalities, this workup would be more appropriate in the outpatient setting by primary care and her rheumatologist. We will be unable to give her diagnosis today but will treat her pain.  Patient raises concern over the possibility of being allergic to the [place] which is a new addition. It seems at least some of the symptoms were resent before starting the Nucynta, but she is complaining of increased swelling and redness.  Allergy is unlikely, but I cannot rule it out. She will hold off on the Nucynta, contact her pain management specialist. Was given a limited prescription for Percocet until she can follow-up.  Final Clinical Impressions(s) / ED Diagnoses   Final diagnoses:  Arthralgia, unspecified joint  Rash and nonspecific skin eruption    New Prescriptions New Prescriptions   No medications on file     Orpah Greek, MD 11/24/15 [place], [place] 11/24/15 [phone]

## 2015-11-24 NOTE — ED Notes (Signed)
ED Provider at bedside. 

## 2015-11-24 NOTE — ED Triage Notes (Signed)
Pt presents to ED for assistance with pain control and assessment of dry mouth associated with an undiagnosed illness that pt has been seeing a rheumatologist for.  Pt sts hx of fibromyalgia and making attempts to be seen at the [facility] for further evaluation.  Tonight pt c/o swelling and redness to bilateral legs and hands.  Pt sts legs were purple last night.  Pt c/o dry mouth which makes swallowing difficult.  Pt c/o back pain, new to chronic issues.  Pt wants to be assessed to be sure this is not an allergic reaction, and also requesting assistance with pain control.

## 2015-11-27 ENCOUNTER — Encounter (HOSPITAL_COMMUNITY): Payer: Self-pay | Admitting: Emergency Medicine

## 2015-11-27 ENCOUNTER — Emergency Department (HOSPITAL_COMMUNITY)
Admission: EM | Admit: 2015-11-27 | Discharge: 2015-11-27 | Disposition: A | Discharge status: Home / Self Care | Payer: BLUE CROSS/BLUE SHIELD | Attending: Emergency Medicine | Admitting: Emergency Medicine

## 2015-11-27 DIAGNOSIS — K2100 Gastro-esophageal reflux disease with esophagitis, without bleeding: Secondary | ICD-10-CM

## 2015-11-27 DIAGNOSIS — Z7982 Long term (current) use of aspirin: Secondary | ICD-10-CM | POA: Insufficient documentation

## 2015-11-27 DIAGNOSIS — Z91040 Latex allergy status: Secondary | ICD-10-CM | POA: Diagnosis not present

## 2015-11-27 DIAGNOSIS — R12 Heartburn: Secondary | ICD-10-CM | POA: Diagnosis present

## 2015-11-27 MED ORDER — PANTOPRAZOLE SODIUM 40 MG PO TBEC: 40.0000 mg | DELAYED_RELEASE_TABLET | Freq: Every day | ORAL | 0 refills | Status: DC

## 2015-11-27 MED ORDER — SUCRALFATE 1 G PO TABS: 1.0000 g | ORAL_TABLET | Freq: Three times a day (TID) | ORAL | 0 refills | Status: DC

## 2015-11-27 MED ORDER — GI COCKTAIL ~~LOC~~
30.0000 mL | Freq: Once | ORAL | Status: AC
  Administered 2015-11-27: 30 mL via ORAL
  Filled 2015-11-27: qty 30

## 2015-11-27 NOTE — ED Provider Notes (Signed)
[facility] DEPT Provider Note   CSN: [id number] Arrival date & time: 11/27/15  0405     History   Chief Complaint Chief Complaint  Patient presents with  . Heartburn    HPI Dominique [name] is a 40 y.o. female.  HPI Pt comes in w/ cc of heart burn. Pt has hx of GERD and esophageal strictures. She reports that she has been having fairly constant acid reflux the last few days. She has been able to eat and drink and keep her meals down. She has no abd pain. Pt has heart burn like pain on her chest and also bitter taste in her mouth. Not taking any meds for GERd.  Past Medical History:  Diagnosis Date  . DVT (deep venous thrombosis) ([place])   . GERD (gastroesophageal reflux disease)   . History of recurrent UTIs   . Obesity   . Raynaud disease 12/21/2011  . Varicose veins     Patient Active Problem List   Diagnosis Date Noted  . Acute upper respiratory infection 12/16/2014  . Bilateral hand pain 11/11/2014  . Dysuria 08/14/2014  . Urinary incontinence 08/14/2014  . Hematuria 08/14/2014  . Right thigh pain 08/14/2014  . UTI (urinary tract infection) 07/29/2014  . Dermatitis 07/02/2014  . Anemia, iron deficiency 03/09/2014  . Personal history of DVT (deep vein thrombosis)   . Skin lesion of breast 01/13/2014  . Fibromyalgia 12/11/2013  . Hallucinations 12/11/2013  . Total body pain 07/18/2013  . Internal hemorrhoid, bleeding 03/27/2013  . Hand swelling 12/27/2012  . 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 Cystitis, chronic 10/15/2012  . Leg swelling 06/18/2012  . Gastritis 04/02/2012  . Obesity (BMI 30-39.9) 02/05/2012  . Varicose veins of lower extremities with other complications 0000000    Past Surgical History:  Procedure Laterality Date  . ABDOMINAL HYSTERECTOMY    . APPENDECTOMY    . BALLOON DILATION N/A 04/02/2014   Procedure: BALLOON DILATION;  Surgeon: Ronald Lobo, MD;  Location: [facility] ENDOSCOPY;  Service: Endoscopy;  Laterality: N/A;  .  CESAREAN SECTION    . COLONOSCOPY WITH PROPOFOL N/A 03/26/2012   Procedure: COLONOSCOPY WITH PROPOFOL;  Surgeon: Cleotis Nipper, MD;  Location: [facility] ENDOSCOPY;  Service: Endoscopy;  Laterality: N/A;  . ENDOVENOUS ABLATION SAPHENOUS VEIN W/ LASER  02-01-2012   right greater saphenous vein and stab phlebectomy 10-20 incisions right leg  by Curt Jews, MD  . ENDOVENOUS ABLATION SAPHENOUS VEIN W/ LASER Left 02-29-2012   left greater saphenous vein and stab phlebectomy left leg 10-20 incisions  by Curt Jews MD  . ENDOVENOUS ABLATION SAPHENOUS VEIN W/ LASER Left   . ESOPHAGOGASTRODUODENOSCOPY (EGD) WITH PROPOFOL N/A 03/26/2012   Procedure: ESOPHAGOGASTRODUODENOSCOPY (EGD) WITH PROPOFOL;  Surgeon: Cleotis Nipper, MD;  Location: [facility] ENDOSCOPY;  Service: Endoscopy;  Laterality: N/A;  . ESOPHAGOGASTRODUODENOSCOPY (EGD) WITH PROPOFOL N/A 04/02/2014   Procedure: ESOPHAGOGASTRODUODENOSCOPY (EGD) WITH PROPOFOL;  Surgeon: Ronald Lobo, MD;  Location: [facility] ENDOSCOPY;  Service: Endoscopy;  Laterality: N/A;  . HERNIA REPAIR  2006    OB History    No data available       Home Medications    Prior to Admission medications   Medication Sig Start Date End Date Taking? Authorizing Provider  acetaminophen (TYLENOL) 325 MG tablet Take 325 mg by mouth every 6 (six) hours as needed for moderate pain.   Yes Historical Provider, MD  amitriptyline (ELAVIL) 50 MG tablet Take 100 mg by mouth at bedtime. 09/16/15  Yes Historical Provider, MD  b  complex vitamins capsule Take 1 capsule by mouth daily. 10/10/15  Yes Brunetta Genera, MD  Ferrous Sulfate (IRON) 325 (65 FE) MG TABS Take 325 mg by mouth daily. 03/09/14  Yes Olam Idler, MD  gabapentin (NEURONTIN) 100 MG capsule Take 100 mg by mouth 3 (three) times daily. 03/25/15  Yes Historical Provider, MD  hydrocortisone 2.5 % ointment Apply topically 2 (two) times daily. 11/04/14  Yes Elberta Leatherwood, MD  meloxicam (MOBIC) 15 MG tablet TAKE ONE TABLET BY MOUTH EVERY DAY  03/30/15  Yes Olam Idler, MD  omeprazole (PRILOSEC) 40 MG capsule TAKE ONE CAPSULE BY MOUTH TWICE DAILY BEFORE MEALS 03/30/15  Yes Olam Idler, MD  oxyCODONE-acetaminophen (PERCOCET) 5-325 MG tablet Take 1-2 tablets by mouth every 4 (four) hours as needed. 11/24/15  Yes Orpah Greek, MD  predniSONE (DELTASONE) 20 MG tablet 3 tabs po daily x 3 days, then 2 tabs x 3 days, then 1.5 tabs x 3 days, then 1 tab x 3 days, then 0.5 tabs x 3 days Patient taking differently: Take 20 mg by mouth daily with breakfast.  11/24/15  Yes Orpah Greek, MD  valACYclovir (VALTREX) 1000 MG tablet Take 0.5 tablets (500 mg total) by mouth 2 (two) times daily. Patient taking differently: Take 500 mg by mouth 2 (two) times daily as needed (for flareups).  12/01/14  Yes Olam Idler, MD  aspirin EC 81 MG tablet Take 1 tablet (81 mg total) by mouth daily. Patient not taking: Reported on 11/27/2015 09/01/15   Josue Hector, MD  Cyanocobalamin (B-12) 1000 MCG SUBL Place 1,000 mcg under the tongue daily. Patient not taking: Reported on 11/27/2015 10/10/15   Brunetta Genera, MD  HYDROcodone-acetaminophen (NORCO/VICODIN) 5-325 MG tablet Take 1-2 tablets by mouth every 4 (four) hours as needed for severe pain. Patient not taking: Reported on 11/27/2015 10/07/15   Olivia Canter Sam, PA-C  hydrOXYzine (VISTARIL) 25 MG capsule Take 1 capsule (25 mg total) by mouth every 6 (six) hours as needed. Patient not taking: Reported on 11/27/2015 07/02/14   Olam Idler, MD  iron polysaccharides (NIFEREX) 150 MG capsule Take 1 capsule (150 mg total) by mouth 2 (two) times daily. Patient not taking: Reported on 11/27/2015 10/10/15   Brunetta Genera, MD  mupirocin ointment (BACTROBAN) 2 % Apply to both nostrils TID for 1 month. Patient not taking: Reported on 11/27/2015 02/07/14   Harden Mo, MD  pantoprazole (PROTONIX) 40 MG tablet Take 1 tablet (40 mg total) by mouth daily. 11/27/15   Varney Biles, MD    promethazine (PHENERGAN) 12.5 MG tablet Take 1 tablet (12.5 mg total) by mouth every 6 (six) hours as needed for nausea or vomiting. Patient not taking: Reported on 11/27/2015 12/16/14   Archie Patten, MD  QC LORATADINE ALLERGY RELIEF 10 MG tablet TAKE ONE TABLET BY MOUTH EVERY DAY Patient not taking: Reported on 11/27/2015 02/12/15   Olam Idler, MD  sucralfate (CARAFATE) 1 g tablet Take 1 tablet (1 g total) by mouth 4 (four) times daily -  with meals and at bedtime. 11/27/15   Varney Biles, MD    Family History Family History  Problem Relation Age of Onset  . Diabetes Mother   . Heart disease Mother   . Hyperlipidemia Mother   . Hypertension Mother   . Other Mother     amputation, varicose veins  . Diabetes Father   . Hyperlipidemia Father   . Hypertension Father   .  Peripheral vascular disease Father   . Other Father     varicose veins  . Deep vein thrombosis Brother   . Other Brother     varicose veins    Social History Social History  Substance Use Topics  . Smoking status: Never Smoker  . Smokeless tobacco: Never Used  . Alcohol use No     Allergies   Contrast media [iodinated diagnostic agents]; Latex; Nsaids; Doxycycline; Eggs or egg-derived products; Lactose intolerance (gi); and Zofran [ondansetron hcl]   Review of Systems Review of Systems  ROS 10 Systems reviewed and are negative for acute change except as noted in the HPI.     Physical Exam Updated Vital Signs BP 111/70   Pulse 107   Temp 98.3 F (36.8 C) (Oral)   Resp 18   Ht 4\' 7"  (1.397 m)   Wt 25 lb (11.3 kg)   SpO2 96%   BMI 5.81 kg/m   Physical Exam  Constitutional: She is oriented to person, place, and time. She appears well-developed.  HENT:  Head: Normocephalic and atraumatic.  Eyes: EOM are normal.  Neck: Normal range of motion. Neck supple.  Cardiovascular: Normal rate.   Pulmonary/Chest: Effort normal.  Abdominal: Bowel sounds are normal.  Neurological: She is  alert and oriented to person, place, and time.  Skin: Skin is warm and dry.  Nursing note and vitals reviewed.    ED Treatments / Results  Labs (all labs ordered are listed, but only abnormal results are displayed) Labs Reviewed - No data to display  EKG  EKG Interpretation  Date/Time:  Saturday November 27 2015 04:16:16 EST Ventricular Rate:  120 PR Interval:    QRS Duration: 93 QT Interval:  321 QTC Calculation: [id number] R Axis:   15 Text Interpretation:  Sinus tachycardia Minimal ST depression, lateral leads Minimal ST elevation, inferior leads No acute changes No significant change since last tracing Confirmed by Kathrynn Humble, MD, Thelma Comp [id number]) on 11/27/2015 7:18:49 AM       Radiology No results found.  Procedures Procedures (including critical care time)  Medications Ordered in ED Medications  gi cocktail (Maalox,Lidocaine,Donnatal) (30 mLs Oral Given During Downtime 11/27/15 0545)     Initial Impression / Assessment and Plan / ED Course  I have reviewed the triage vital signs and the nursing notes.  Pertinent labs & imaging results that were available during my care of the patient were reviewed by me and considered in my medical decision making (see chart for details).  Clinical Course     Pt with likely worsening of the strictures - that has led to GERD, or she has other esophageal  Motility issues. Needs to see GI asap - and that detail was stressed. Stable for d/c.  Final Clinical Impressions(s) / ED Diagnoses   Final diagnoses:  Gastroesophageal reflux disease with esophagitis    New Prescriptions New Prescriptions   PANTOPRAZOLE (PROTONIX) 40 MG TABLET    Take 1 tablet (40 mg total) by mouth daily.   SUCRALFATE (CARAFATE) 1 G TABLET    Take 1 tablet (1 g total) by mouth 4 (four) times daily -  with meals and at bedtime.     Varney Biles, MD 11/27/15 [phone]

## 2015-11-27 NOTE — ED Notes (Signed)
Pt left room without signing while RN was asking MD question regarding prescriptions.

## 2015-11-27 NOTE — ED Triage Notes (Signed)
Pt brought to ED by [facility] from home for c/o heartburn and 8/10 burning sensation on her throat. Pt took some Alcazerzer and zantac with no relief. Pt having some nausea and bad taste. VS for EMS BP 131/78, HR 121, SPO2 98%, pt very anxious.

## 2015-11-27 NOTE — ED Notes (Signed)
Pt in bathroom

## 2015-11-27 NOTE — Discharge Instructions (Signed)
Please see the [facility] [place] doctor as soon as possible. As discussed - this matter is likely esophageal,and given your history of requiring stretching, it is prudent that you see the [facility] doctors. Return to the ER if the pain gets severe, you are unable to keep any food/meds down.

## 2015-11-27 NOTE — ED Notes (Signed)
Pt called out to say the GI cocktail isn't working and she has been very patient with [place]. Dr. Kathrynn Humble notified

## 2015-11-30 ENCOUNTER — Ambulatory Visit: Payer: BLUE CROSS/BLUE SHIELD | Admitting: Hematology

## 2015-11-30 ENCOUNTER — Other Ambulatory Visit (HOSPITAL_BASED_OUTPATIENT_CLINIC_OR_DEPARTMENT_OTHER): Payer: BLUE CROSS/BLUE SHIELD

## 2015-11-30 ENCOUNTER — Encounter: Payer: Self-pay | Admitting: *Deleted

## 2015-11-30 DIAGNOSIS — D473 Essential (hemorrhagic) thrombocythemia: Secondary | ICD-10-CM | POA: Diagnosis not present

## 2015-11-30 DIAGNOSIS — D509 Iron deficiency anemia, unspecified: Secondary | ICD-10-CM

## 2015-11-30 DIAGNOSIS — D500 Iron deficiency anemia secondary to blood loss (chronic): Secondary | ICD-10-CM

## 2015-11-30 DIAGNOSIS — E538 Deficiency of other specified B group vitamins: Secondary | ICD-10-CM | POA: Diagnosis not present

## 2015-11-30 LAB — CBC & DIFF AND RETIC
BASO%: 0.1 % (ref 0.0–2.0)
Basophils Absolute: 0 10*3/uL (ref 0.0–0.1)
EOS ABS: 0.4 10*3/uL (ref 0.0–0.5)
EOS%: 4.8 % (ref 0.0–7.0)
HCT: 33.2 % — ABNORMAL LOW (ref 34.8–46.6)
HEMOGLOBIN: 10.5 g/dL — AB (ref 11.6–15.9)
Immature Retic Fract: 18.7 % — ABNORMAL HIGH (ref 1.60–10.00)
LYMPH%: 36 % (ref 14.0–49.7)
MCH: 26.6 pg (ref 25.1–34.0)
MCHC: 31.6 g/dL (ref 31.5–36.0)
MCV: 84.1 fL (ref 79.5–101.0)
MONO#: 0.6 10*3/uL (ref 0.1–0.9)
MONO%: 7.8 % (ref 0.0–14.0)
NEUT%: 51.3 % (ref 38.4–76.8)
NEUTROS ABS: 4.1 10*3/uL (ref 1.5–6.5)
Platelets: 428 10*3/uL — ABNORMAL HIGH (ref 145–400)
RBC: 3.95 10*6/uL (ref 3.70–5.45)
RDW: 16 % — AB (ref 11.2–14.5)
RETIC %: 1.35 % (ref 0.70–2.10)
Retic Ct Abs: 53.33 10*3/uL (ref 33.70–90.70)
WBC: 8.1 10*3/uL (ref 3.9–10.3)
lymph#: 2.9 10*3/uL (ref 0.9–3.3)

## 2015-11-30 NOTE — Progress Notes (Signed)
Pt arrived over 30 minutes late for lab and therefore MD visit today.  Per Dr. Irene Limbo, pt needs to reschedule apt.  This RN spoke with pt in lobby.  Pt verbalized understanding.  Scheduling message sent to reschedule apt.

## 2015-12-01 LAB — ANTI-PARIETAL ANTIBODY: PARIETAL CELL AB: 4.4 U (ref 0.0–20.0)

## 2015-12-01 LAB — INTRINSIC FACTOR ANTIBODIES: Intrinsic Factor Abs, Serum: 1 AU/mL (ref 0.0–1.1)

## 2015-12-01 LAB — VITAMIN B12: VITAMIN B 12: 416 pg/mL (ref 211–946)

## 2015-12-02 LAB — IRON AND TIBC CHCC
IRON SATURATION: 6 % — AB (ref 15–55)
Iron: 18 ug/dL — ABNORMAL LOW (ref 27–159)
Total Iron Binding Capacity: 297 ug/dL (ref 250–450)
UIBC: 279 ug/dL (ref 131–425)

## 2015-12-02 LAB — FERRITIN CHCC: FERRITIN: 14 ng/mL — AB (ref 15–150)

## 2015-12-08 ENCOUNTER — Emergency Department (HOSPITAL_COMMUNITY): Payer: BLUE CROSS/BLUE SHIELD

## 2015-12-08 ENCOUNTER — Encounter (HOSPITAL_COMMUNITY): Payer: Self-pay | Admitting: *Deleted

## 2015-12-08 ENCOUNTER — Observation Stay (HOSPITAL_COMMUNITY)
Admission: EM | Admit: 2015-12-08 | Discharge: 2015-12-09 | Disposition: A | Discharge status: Home / Self Care | Payer: BLUE CROSS/BLUE SHIELD | Attending: Internal Medicine | Admitting: Internal Medicine

## 2015-12-08 DIAGNOSIS — M797 Fibromyalgia: Secondary | ICD-10-CM | POA: Diagnosis not present

## 2015-12-08 DIAGNOSIS — E876 Hypokalemia: Secondary | ICD-10-CM

## 2015-12-08 DIAGNOSIS — Z87440 Personal history of urinary (tract) infections: Secondary | ICD-10-CM | POA: Diagnosis not present

## 2015-12-08 DIAGNOSIS — Z7982 Long term (current) use of aspirin: Secondary | ICD-10-CM | POA: Insufficient documentation

## 2015-12-08 DIAGNOSIS — Z86718 Personal history of other venous thrombosis and embolism: Secondary | ICD-10-CM

## 2015-12-08 DIAGNOSIS — K219 Gastro-esophageal reflux disease without esophagitis: Secondary | ICD-10-CM | POA: Diagnosis not present

## 2015-12-08 DIAGNOSIS — M7989 Other specified soft tissue disorders: Secondary | ICD-10-CM | POA: Insufficient documentation

## 2015-12-08 DIAGNOSIS — Z6841 Body Mass Index (BMI) 40.0 and over, adult: Secondary | ICD-10-CM | POA: Insufficient documentation

## 2015-12-08 DIAGNOSIS — L03119 Cellulitis of unspecified part of limb: Secondary | ICD-10-CM

## 2015-12-08 DIAGNOSIS — D473 Essential (hemorrhagic) thrombocythemia: Secondary | ICD-10-CM

## 2015-12-08 DIAGNOSIS — R079 Chest pain, unspecified: Secondary | ICD-10-CM | POA: Diagnosis present

## 2015-12-08 DIAGNOSIS — Z791 Long term (current) use of non-steroidal anti-inflammatories (NSAID): Secondary | ICD-10-CM | POA: Insufficient documentation

## 2015-12-08 DIAGNOSIS — E669 Obesity, unspecified: Secondary | ICD-10-CM | POA: Diagnosis present

## 2015-12-08 DIAGNOSIS — G8929 Other chronic pain: Secondary | ICD-10-CM | POA: Diagnosis not present

## 2015-12-08 DIAGNOSIS — D75839 Thrombocytosis, unspecified: Secondary | ICD-10-CM

## 2015-12-08 DIAGNOSIS — L0390 Cellulitis, unspecified: Secondary | ICD-10-CM | POA: Diagnosis present

## 2015-12-08 DIAGNOSIS — Z79899 Other long term (current) drug therapy: Secondary | ICD-10-CM | POA: Diagnosis not present

## 2015-12-08 DIAGNOSIS — E8809 Other disorders of plasma-protein metabolism, not elsewhere classified: Secondary | ICD-10-CM | POA: Diagnosis not present

## 2015-12-08 DIAGNOSIS — Z8719 Personal history of other diseases of the digestive system: Secondary | ICD-10-CM

## 2015-12-08 DIAGNOSIS — D509 Iron deficiency anemia, unspecified: Secondary | ICD-10-CM | POA: Diagnosis present

## 2015-12-08 LAB — CBC WITH DIFFERENTIAL/PLATELET
BASOS ABS: 0 10*3/uL (ref 0.0–0.1)
BASOS PCT: 0 %
EOS ABS: 0.3 10*3/uL (ref 0.0–0.7)
Eosinophils Relative: 3 %
HEMATOCRIT: 34.2 % — AB (ref 36.0–46.0)
HEMOGLOBIN: 10.5 g/dL — AB (ref 12.0–15.0)
Lymphocytes Relative: 34 %
Lymphs Abs: 3.2 10*3/uL (ref 0.7–4.0)
MCH: 25.7 pg — ABNORMAL LOW (ref 26.0–34.0)
MCHC: 30.7 g/dL (ref 30.0–36.0)
MCV: 83.8 fL (ref 78.0–100.0)
MONOS PCT: 8 %
Monocytes Absolute: 0.7 10*3/uL (ref 0.1–1.0)
NEUTROS ABS: 5.3 10*3/uL (ref 1.7–7.7)
NEUTROS PCT: 55 %
Platelets: 394 10*3/uL (ref 150–400)
RBC: 4.08 MIL/uL (ref 3.87–5.11)
RDW: 16 % — ABNORMAL HIGH (ref 11.5–15.5)
WBC: 9.5 10*3/uL (ref 4.0–10.5)

## 2015-12-08 LAB — URINE MICROSCOPIC-ADD ON

## 2015-12-08 LAB — CBC
HEMATOCRIT: 31.2 % — AB (ref 36.0–46.0)
Hemoglobin: 9.6 g/dL — ABNORMAL LOW (ref 12.0–15.0)
MCH: 25.8 pg — ABNORMAL LOW (ref 26.0–34.0)
MCHC: 30.8 g/dL (ref 30.0–36.0)
MCV: 83.9 fL (ref 78.0–100.0)
Platelets: 398 10*3/uL (ref 150–400)
RBC: 3.72 MIL/uL — AB (ref 3.87–5.11)
RDW: 16.2 % — ABNORMAL HIGH (ref 11.5–15.5)
WBC: 9.7 10*3/uL (ref 4.0–10.5)

## 2015-12-08 LAB — URINALYSIS, ROUTINE W REFLEX MICROSCOPIC
BILIRUBIN URINE: NEGATIVE
Glucose, UA: NEGATIVE mg/dL
Ketones, ur: NEGATIVE mg/dL
LEUKOCYTES UA: NEGATIVE
NITRITE: NEGATIVE
Protein, ur: NEGATIVE mg/dL
SPECIFIC GRAVITY, URINE: 1.014 (ref 1.005–1.030)
pH: 5.5 (ref 5.0–8.0)

## 2015-12-08 LAB — CREATININE, SERUM: Creatinine, Ser: 0.7 mg/dL (ref 0.44–1.00)

## 2015-12-08 LAB — COMPREHENSIVE METABOLIC PANEL
ALK PHOS: 80 U/L (ref 38–126)
ALT: 16 U/L (ref 14–54)
ANION GAP: 12 (ref 5–15)
AST: 24 U/L (ref 15–41)
Albumin: 3 g/dL — ABNORMAL LOW (ref 3.5–5.0)
BILIRUBIN TOTAL: 0.4 mg/dL (ref 0.3–1.2)
BUN: 8 mg/dL (ref 6–20)
CALCIUM: 8.5 mg/dL — AB (ref 8.9–10.3)
CO2: 24 mmol/L (ref 22–32)
CREATININE: 0.76 mg/dL (ref 0.44–1.00)
Chloride: 103 mmol/L (ref 101–111)
Glucose, Bld: 114 mg/dL — ABNORMAL HIGH (ref 65–99)
Potassium: 3.3 mmol/L — ABNORMAL LOW (ref 3.5–5.1)
SODIUM: 139 mmol/L (ref 135–145)
TOTAL PROTEIN: 6.3 g/dL — AB (ref 6.5–8.1)

## 2015-12-08 LAB — LACTIC ACID, PLASMA: LACTIC ACID, VENOUS: 1.6 mmol/L (ref 0.5–1.9)

## 2015-12-08 LAB — I-STAT CG4 LACTIC ACID, ED
Lactic Acid, Venous: 2.51 mmol/L (ref 0.5–1.9)
Lactic Acid, Venous: 1.76 mmol/L (ref 0.5–1.9)

## 2015-12-08 LAB — PREGNANCY, URINE: PREG TEST UR: NEGATIVE

## 2015-12-08 LAB — I-STAT TROPONIN, ED: Troponin i, poc: 0 ng/mL (ref 0.00–0.08)

## 2015-12-08 LAB — TROPONIN I

## 2015-12-08 LAB — BRAIN NATRIURETIC PEPTIDE: B Natriuretic Peptide: 14.5 pg/mL (ref 0.0–100.0)

## 2015-12-08 MED ORDER — ENOXAPARIN SODIUM 40 MG/0.4ML ~~LOC~~ SOLN
40.0000 mg | SUBCUTANEOUS | Status: DC
  Administered 2015-12-08: 40 mg via SUBCUTANEOUS
  Filled 2015-12-08: qty 0.4

## 2015-12-08 MED ORDER — ACETAMINOPHEN 325 MG PO TABS: 325.0000 mg | ORAL_TABLET | Freq: Four times a day (QID) | ORAL | Status: DC | PRN

## 2015-12-08 MED ORDER — GABAPENTIN 100 MG PO CAPS
100.0000 mg | ORAL_CAPSULE | Freq: Three times a day (TID) | ORAL | Status: DC
  Administered 2015-12-08 – 2015-12-09 (×3): 100 mg via ORAL
  Filled 2015-12-08 (×3): qty 1

## 2015-12-08 MED ORDER — MORPHINE SULFATE (PF) 4 MG/ML IV SOLN
2.0000 mg | Freq: Once | INTRAVENOUS | Status: AC
  Administered 2015-12-08: 2 mg via INTRAVENOUS
  Filled 2015-12-08: qty 1

## 2015-12-08 MED ORDER — PIPERACILLIN-TAZOBACTAM 3.375 G IVPB 30 MIN
3.3750 g | Freq: Once | INTRAVENOUS | Status: AC
  Administered 2015-12-08: 3.375 g via INTRAVENOUS
  Filled 2015-12-08: qty 50

## 2015-12-08 MED ORDER — B-12 1000 MCG SL SUBL: 1000.0000 ug | SUBLINGUAL_TABLET | Freq: Every day | SUBLINGUAL | Status: DC

## 2015-12-08 MED ORDER — VANCOMYCIN HCL IN DEXTROSE 1-5 GM/200ML-% IV SOLN
1000.0000 mg | Freq: Two times a day (BID) | INTRAVENOUS | Status: DC
  Administered 2015-12-08 – 2015-12-09 (×2): 1000 mg via INTRAVENOUS
  Filled 2015-12-08 (×3): qty 200

## 2015-12-08 MED ORDER — GI COCKTAIL ~~LOC~~
30.0000 mL | Freq: Once | ORAL | Status: AC
  Administered 2015-12-08: 30 mL via ORAL
  Filled 2015-12-08: qty 30

## 2015-12-08 MED ORDER — VANCOMYCIN HCL IN DEXTROSE 1-5 GM/200ML-% IV SOLN
1000.0000 mg | Freq: Once | INTRAVENOUS | Status: DC
  Filled 2015-12-08: qty 200

## 2015-12-08 MED ORDER — PANTOPRAZOLE SODIUM 40 MG PO TBEC
40.0000 mg | DELAYED_RELEASE_TABLET | Freq: Once | ORAL | Status: AC
  Administered 2015-12-08: 40 mg via ORAL
  Filled 2015-12-08: qty 1

## 2015-12-08 MED ORDER — VITAMIN B-12 1000 MCG PO TABS
1000.0000 ug | ORAL_TABLET | Freq: Every day | ORAL | Status: DC
  Administered 2015-12-08 – 2015-12-09 (×2): 1000 ug via ORAL
  Filled 2015-12-08 (×2): qty 1

## 2015-12-08 MED ORDER — POTASSIUM CHLORIDE CRYS ER 20 MEQ PO TBCR
40.0000 meq | EXTENDED_RELEASE_TABLET | Freq: Once | ORAL | Status: AC
  Administered 2015-12-08: 40 meq via ORAL
  Filled 2015-12-08: qty 2

## 2015-12-08 MED ORDER — SODIUM CHLORIDE 0.9 % IV SOLN
1000.0000 mL | INTRAVENOUS | Status: DC
  Administered 2015-12-08: 1000 mL via INTRAVENOUS

## 2015-12-08 MED ORDER — ASPIRIN EC 81 MG PO TBEC
81.0000 mg | DELAYED_RELEASE_TABLET | Freq: Every day | ORAL | Status: DC
  Administered 2015-12-08 – 2015-12-09 (×2): 81 mg via ORAL
  Filled 2015-12-08 (×2): qty 1

## 2015-12-08 MED ORDER — PIPERACILLIN-TAZOBACTAM 3.375 G IVPB
3.3750 g | Freq: Three times a day (TID) | INTRAVENOUS | Status: DC
  Administered 2015-12-08 – 2015-12-09 (×2): 3.375 g via INTRAVENOUS
  Filled 2015-12-08 (×6): qty 50

## 2015-12-08 MED ORDER — OXYCODONE-ACETAMINOPHEN 5-325 MG PO TABS
1.0000 | ORAL_TABLET | ORAL | Status: DC | PRN
  Administered 2015-12-08: 1 via ORAL
  Administered 2015-12-08: 2 via ORAL
  Administered 2015-12-08: 1 via ORAL
  Administered 2015-12-09 (×2): 2 via ORAL
  Filled 2015-12-08 (×3): qty 2
  Filled 2015-12-08: qty 1
  Filled 2015-12-08: qty 2

## 2015-12-08 MED ORDER — PIPERACILLIN-TAZOBACTAM 3.375 G IVPB 30 MIN: 3.3750 g | Freq: Once | INTRAVENOUS | Status: DC

## 2015-12-08 MED ORDER — FERROUS SULFATE 325 (65 FE) MG PO TABS
325.0000 mg | ORAL_TABLET | Freq: Every day | ORAL | Status: DC
  Administered 2015-12-09: 325 mg via ORAL
  Filled 2015-12-08: qty 1

## 2015-12-08 MED ORDER — SODIUM CHLORIDE 0.9 % IV BOLUS (SEPSIS)
1000.0000 mL | Freq: Once | INTRAVENOUS | Status: AC
  Administered 2015-12-08: 1000 mL via INTRAVENOUS

## 2015-12-08 MED ORDER — B COMPLEX-C PO TABS
1.0000 | ORAL_TABLET | Freq: Every day | ORAL | Status: DC
  Administered 2015-12-08 – 2015-12-09 (×2): 1 via ORAL
  Filled 2015-12-08 (×2): qty 1

## 2015-12-08 MED ORDER — B COMPLEX VITAMINS PO CAPS: 1.0000 | ORAL_CAPSULE | Freq: Every day | ORAL | Status: DC

## 2015-12-08 MED ORDER — VANCOMYCIN HCL IN DEXTROSE 1-5 GM/200ML-% IV SOLN: 1000.0000 mg | Freq: Once | INTRAVENOUS | Status: DC

## 2015-12-08 MED ORDER — AMITRIPTYLINE HCL 100 MG PO TABS
100.0000 mg | ORAL_TABLET | Freq: Every day | ORAL | Status: DC
  Administered 2015-12-08: 100 mg via ORAL
  Filled 2015-12-08: qty 1

## 2015-12-08 MED ORDER — VANCOMYCIN HCL 10 G IV SOLR
1500.0000 mg | Freq: Once | INTRAVENOUS | Status: AC
  Administered 2015-12-08: 1500 mg via INTRAVENOUS
  Filled 2015-12-08: qty 1500

## 2015-12-08 MED ORDER — SUCRALFATE 1 G PO TABS
1.0000 g | ORAL_TABLET | Freq: Three times a day (TID) | ORAL | Status: DC
  Administered 2015-12-08 – 2015-12-09 (×4): 1 g via ORAL
  Filled 2015-12-08 (×4): qty 1

## 2015-12-08 NOTE — Progress Notes (Signed)
Pharmacy Antibiotic Note  Dominique [name] is a 40 y.o. female admitted on 12/08/2015 with cellulitis.  Pharmacy has been consulted for Vancomycin and Zosyn dosing. Noted pt on Cephalexin since 11/21 - apparently was taking smaller dose than prescribed.   Plan: Zosyn 3.375gm IV now over 30 min then 3.375gm IV q8h - subsequent doses over 4 hours Vancomycin 1500mg  IV now then 1gm IV q12h Will f/u micro data, renal function, and pt's clinical condition Vanc trough prn   Height: 4\' 7"  (139.7 cm) Weight: 212 lb (96.2 kg) IBW/kg (Calculated) : 34  Temp (24hrs), Avg:98.4 F (36.9 C), Min:98.4 F (36.9 C), Max:98.4 F (36.9 C)   Recent Labs Lab 12/08/15 0527  LATICACIDVEN 2.51*    Estimated Creatinine Clearance: 80.9 mL/min (by C-G formula based on SCr of 0.86 mg/dL).    Allergies  Allergen Reactions  . Contrast Media [Iodinated Diagnostic Agents] Anaphylaxis and Swelling    Tongue swelling  . Latex Anaphylaxis  . Nsaids Other (See Comments)    Worsened gastritis; 02/22/14 GI bleed  . Doxycycline Other (See Comments)    MADE LEGS HURT  . Eggs Or Egg-Derived Products Nausea And Vomiting  . Lactose Intolerance (Gi) Diarrhea  . Zofran [Ondansetron Hcl] Other (See Comments)    Hives, numbness of legs with previous use    Antimicrobials this admission: 11/29 Zosyn >>  11/29 Vancomycin >>   Dose adjustments this admission: n/a  Microbiology results: 11/29 BCx x2:   UCx:    Thank you for allowing pharmacy to be a part of this patient's care.  Sherlon Handing, PharmD, BCPS Clinical pharmacist, pager [phone] 12/08/2015 5:50 AM

## 2015-12-08 NOTE — ED Notes (Signed)
Admitting at bedside 

## 2015-12-08 NOTE — ED Notes (Signed)
Report called to 3E rn

## 2015-12-08 NOTE — ED Triage Notes (Signed)
Patient was awakened with chest tightness, cough, nausea.  History of reflux.  Went to bed tonight and woke up with cough, nausea and chest tightness.  States all is worse when she lays down.

## 2015-12-08 NOTE — H&P (Signed)
[facility] History and Physical  Dominique [name] [id number] DOB: [birth year] DOA: 12/08/2015  Referring physician: Loni Muse, [place] PCP: Eloise Levels, NP [facility] GI: [place] Cardiology: Genevie Ann Hematology: Irene Limbo  Chief Complaint: vomiting, chest burning  HPI: Dominique [name] is a 40 y.o. female with pmh of fibromyalgia followed at pain management clinic, chronic iron deficiency anemia, thrombocytosis, esophageal stricture s/p dilitation 03/2014, NSAID gastritis per EGD 03/2012, Raynauds syndrome, hx DVT during pregnancy, hemorrhoids presents to ED after waking up with episodes of vomiting for the last 2 nights. Pt states chest "burning" has awoken her from sleep last 2 nights. This has happened to her before and feels like reflux. States she has a "yucky taste in her mouth" like previous episodes of reflux. She continues to have burning sensation in chest, but no recurrent vomiting since arrival to ED. Of note, pt was seen by [facility] last week at which time her PPI dose was increased. She is due to f/u in one month. She takes occasional prednisone for unspecified "flares" of joint pain and erythema, but has not had this in approximately 2 weeks. She denies any SOB, palpitations, hematemesis, abdominal pain, melena, hematachezia.  Was actually called by ED for suspected cellulitis of lower extremities. Pt was seen in ED last week for similar symptoms and put on Keflex. She describes chronic erythema to LLE, but redness and pain to RLE has worsened since yesterday after work. 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 She denies recent fever, swelling to legs has improved since lying down in ED. ED workup show lactic acid of 2.5, mild tachycardia but otherwise unremarkable. She has been given 1L NS bolus and started on Vancomycin and Zosyn for cellulitis.    CODE STATUS: full code  Family hx:  Reviewed and noncontributory to  admit.  Social Hx: Single, lives alone. Works part time as Educational psychologist. Denies hx of tobacco, etoh or drug use.   Review of Systems:  Constitutional: WNWD, obese in NAD No weight loss, night sweats, Fevers, chills, fatigue.  No headaches, Difficulty swallowing,Tooth/dental problems,Sore throat, Cardio-vascular: denies palpitations, chronic LE edema No Orthopnea, PND, swelling in lower extremities, anasarca, dizziness, palpitations  GI: +reflux, +heartburn, indigestion, no abdominal pain, diarrhea, change in bowel habits, loss of appetite  Resp: No shortness of breath with exertion or at rest. No excess mucus, no productive cough, No non-productive cough, No coughing up of blood.No change in color of mucus.No wheezing.No chest wall deformity  Skin: erythema to ankle L>R. no rash or lesions.  GU: no dysuria, change in color of urine, no urgency or frequency. No flank pain.  Musculoskeletal:  Chronic joint pain, but no swelling. No decreased range of motion. No back pain.  Psych: No change in mood or affect. No depression or anxiety. No memory loss.  Neuro: No change in sensation, unilateral strength, or cognitive abilities  All other systems were reviewed and are negative.  Past Medical History:  Diagnosis Date  . DVT (deep venous thrombosis) ([place])   . GERD (gastroesophageal reflux disease)   . History of recurrent UTIs   . Obesity   . Raynaud disease 12/21/2011  . Varicose veins    Past Surgical History:  Procedure Laterality Date  . ABDOMINAL HYSTERECTOMY    . APPENDECTOMY    . BALLOON DILATION N/A 04/02/2014   Procedure: BALLOON DILATION;  Surgeon: Ronald Lobo, MD;  Location: [facility];  Service: Endoscopy;  Laterality: N/A;  . CESAREAN SECTION    .  COLONOSCOPY WITH PROPOFOL N/A 03/26/2012   Procedure: COLONOSCOPY WITH PROPOFOL;  Surgeon: Cleotis Nipper, MD;  Location: [facility];  Service: Endoscopy;  Laterality: N/A;  . ENDOVENOUS ABLATION SAPHENOUS VEIN W/ LASER   02-01-2012   right greater saphenous vein and stab phlebectomy 10-20 incisions right leg  by Curt Jews, MD  . ENDOVENOUS ABLATION SAPHENOUS VEIN W/ LASER Left 02-29-2012   left greater saphenous vein and stab phlebectomy left leg 10-20 incisions  by Curt Jews MD  . ENDOVENOUS ABLATION SAPHENOUS VEIN W/ LASER Left   . ESOPHAGOGASTRODUODENOSCOPY (EGD) WITH PROPOFOL N/A 03/26/2012   Procedure: ESOPHAGOGASTRODUODENOSCOPY (EGD) WITH PROPOFOL;  Surgeon: Cleotis Nipper, MD;  Location: [facility];  Service: Endoscopy;  Laterality: N/A;  . ESOPHAGOGASTRODUODENOSCOPY (EGD) WITH PROPOFOL N/A 04/02/2014   Procedure: ESOPHAGOGASTRODUODENOSCOPY (EGD) WITH PROPOFOL;  Surgeon: Ronald Lobo, MD;  Location: [facility];  Service: Endoscopy;  Laterality: N/A;  . HERNIA REPAIR  2006   Social History:  reports that she has never smoked. She has never used smokeless tobacco. She reports that she does not drink alcohol or use drugs.  Allergies  Allergen Reactions  . Contrast Media [Iodinated Diagnostic Agents] Anaphylaxis and Swelling    Tongue swelling  . Latex Anaphylaxis  . Nsaids Other (See Comments)    Worsened gastritis; 02/22/14 GI bleed  . Doxycycline Other (See Comments)    MADE LEGS HURT  . Eggs Or Egg-Derived Products Nausea And Vomiting  . Lactose Intolerance (Gi) Diarrhea  . Zofran [Ondansetron Hcl] Other (See Comments)    Hives, numbness of legs with previous use    Family History  Problem Relation Age of Onset  . Diabetes Mother   . Heart disease Mother   . Hyperlipidemia Mother   . Hypertension Mother   . Other Mother     amputation, varicose veins  . Diabetes Father   . Hyperlipidemia Father   . Hypertension Father   . Peripheral vascular disease Father   . Other Father     varicose veins  . Deep vein thrombosis Brother   . Other Brother     varicose veins     Prior to Admission medications   Medication Sig Start Date End Date Taking? Authorizing Provider   acetaminophen (TYLENOL) 325 MG tablet Take 325 mg by mouth every 6 (six) hours as needed for moderate pain.    Historical Provider, MD  amitriptyline (ELAVIL) 50 MG tablet Take 100 mg by mouth at bedtime. 09/16/15   Historical Provider, MD  aspirin EC 81 MG tablet Take 1 tablet (81 mg total) by mouth daily. Patient not taking: Reported on 11/27/2015 09/01/15   Josue Hector, MD  b complex vitamins capsule Take 1 capsule by mouth daily. 10/10/15   Brunetta Genera, MD  Cyanocobalamin (B-12) 1000 MCG SUBL Place 1,000 mcg under the tongue daily. Patient not taking: Reported on 11/27/2015 10/10/15   Brunetta Genera, MD  Ferrous Sulfate (IRON) 325 (65 FE) MG TABS Take 325 mg by mouth daily. 03/09/14   Olam Idler, MD  gabapentin (NEURONTIN) 100 MG capsule Take 100 mg by mouth 3 (three) times daily. 03/25/15   Historical Provider, MD  HYDROcodone-acetaminophen (NORCO/VICODIN) 5-325 MG tablet Take 1-2 tablets by mouth every 4 (four) hours as needed for severe pain. Patient not taking: Reported on 11/27/2015 10/07/15   Olivia Canter Sam, PA-C  hydrocortisone 2.5 % ointment Apply topically 2 (two) times daily. 11/04/14   Elberta Leatherwood, MD  hydrOXYzine (VISTARIL) 25 MG  capsule Take 1 capsule (25 mg total) by mouth every 6 (six) hours as needed. Patient not taking: Reported on 11/27/2015 07/02/14   Olam Idler, MD  iron polysaccharides (NIFEREX) 150 MG capsule Take 1 capsule (150 mg total) by mouth 2 (two) times daily. Patient not taking: Reported on 11/27/2015 10/10/15   Brunetta Genera, MD  meloxicam [facility]) 15 MG tablet TAKE ONE TABLET BY MOUTH EVERY DAY 03/30/15   Olam Idler, MD  mupirocin ointment (BACTROBAN) 2 % Apply to both nostrils TID for 1 month. Patient not taking: Reported on 11/27/2015 02/07/14   Harden Mo, MD  omeprazole (PRILOSEC) 40 MG capsule TAKE ONE CAPSULE BY MOUTH TWICE DAILY BEFORE MEALS 03/30/15   Olam Idler, MD  oxyCODONE-acetaminophen (PERCOCET) 5-325 MG tablet Take  1-2 tablets by mouth every 4 (four) hours as needed. 11/24/15   Orpah Greek, MD  pantoprazole (PROTONIX) 40 MG tablet Take 1 tablet (40 mg total) by mouth daily. 11/27/15   Margette Fast, MD  predniSONE (DELTASONE) 20 MG tablet 3 tabs po daily x 3 days, then 2 tabs x 3 days, then 1.5 tabs x 3 days, then 1 tab x 3 days, then 0.5 tabs x 3 days Patient taking differently: Take 20 mg by mouth daily with breakfast.  11/24/15   Orpah Greek, MD  promethazine (PHENERGAN) 12.5 MG tablet Take 1 tablet (12.5 mg total) by mouth every 6 (six) hours as needed for nausea or vomiting. Patient not taking: Reported on 11/27/2015 12/16/14   Archie Patten, MD  QC LORATADINE ALLERGY RELIEF 10 MG tablet TAKE ONE TABLET BY MOUTH EVERY DAY Patient not taking: Reported on 11/27/2015 02/12/15   Olam Idler, MD  sucralfate (CARAFATE) 1 g tablet Take 1 tablet (1 g total) by mouth 4 (four) times daily -  with meals and at bedtime. 11/27/15   Margette Fast, MD  valACYclovir (VALTREX) 1000 MG tablet Take 0.5 tablets (500 mg total) by mouth 2 (two) times daily. Patient taking differently: Take 500 mg by mouth 2 (two) times daily as needed (for flareups).  12/01/14   Olam Idler, MD   Physical Exam: Vitals:   12/08/15 0445 12/08/15 0624 12/08/15 0701 12/08/15 0800  BP: 122/71 104/68 116/67 111/68  Pulse: 98 103 97 99  Resp: 15 16 14 15   Temp:      TempSrc:      SpO2: 99% 98% 97% 96%  Weight:      Height:        Wt Readings from Last 3 Encounters:  12/08/15 96.2 kg (212 lb)  11/27/15 11.3 kg (25 lb)  11/04/15 94.3 kg (208 lb)    General:  Appears calm and comfortable Eyes: PERRL, EOMI, normal lids, iris ENT: mucous membranes moist, no lesions Neck: no LAD, masses or thyromegaly Cardiovascular: RRR, no m/r/g. No LE edema.  Respiratory: CTA bilaterally, no w/r/r. Normal respiratory effort. Abdomen: soft, ntnd, BS+ Skin: Erythema to tibial area bilateral LEs L>R, very mild edema. Chronic  mild erythema to right dorsal hand Musculoskeletal: grossly normal tone BUE/BLE Psychiatric: grossly normal mood and affect, speech fluent and appropriate Neurologic: CN 2-12 grossly intact, moves all extremities in coordinated fashion.          Labs on Admission:  Basic Metabolic Panel:  Recent Labs Lab 12/08/15 0545  NA 139  K 3.3*  CL 103  CO2 24  GLUCOSE 114*  BUN 8  CREATININE 0.76  CALCIUM 8.5*  Liver Function Tests:  Recent Labs Lab 12/08/15 0545  AST 24  ALT 16  ALKPHOS 80  BILITOT 0.4  PROT 6.3*  ALBUMIN 3.0*   No results for input(s): LIPASE, AMYLASE in the last 168 hours. No results for input(s): AMMONIA in the last 168 hours. CBC:  Recent Labs Lab 12/08/15 0545  WBC 9.5  NEUTROABS 5.3  HGB 10.5*  HCT 34.2*  MCV 83.8  PLT 394   Cardiac Enzymes: No results for input(s): CKTOTAL, CKMB, CKMBINDEX, TROPONINI in the last 168 hours.  BNP (last 3 results)  Recent Labs  08/29/15 1723 12/08/15 0546  BNP 21.9 14.5    ProBNP (last 3 results) No results for input(s): PROBNP in the last 8760 hours.   Serum creatinine: 0.76 mg/dL 12/08/15 0545 Estimated creatinine clearance: 86.9 mL/min  CBG: No results for input(s): GLUCAP in the last 168 hours.  Radiological Exams on Admission: Dg Chest 2 View  Result Date: 12/08/2015 CLINICAL DATA:  Acute onset of cough, shortness of breath and generalized chest pain. Initial encounter. EXAM: CHEST  2 VIEW COMPARISON:  Chest radiograph performed 08/29/2015 FINDINGS: The lungs are hypoexpanded but appear grossly clear. There is no evidence of focal opacification, pleural effusion or pneumothorax. The heart is normal in size; the mediastinal contour is within normal limits. No acute osseous abnormalities are seen. IMPRESSION: Lungs hypoexpanded but grossly clear. Electronically Signed   By: Garald Balding M.D.   On: 12/08/2015 06:12    EKG: Independently reviewed. NSR at 100bpm, no t wave  changes  Assessment/Plan Active Problems:   Obesity (BMI 30-39.9)   Fibromyalgia   Personal history of DVT (deep vein thrombosis)   Anemia, iron deficiency   Cellulitis   H/O gastroesophageal reflux (GERD)   Hypokalemia   Thrombocytosis ([facility])   LE cellulitis Vs chronic venous changes, but with increasing pain/erythema over last 2 days. Failed outpt treatment on Keflex. Elevated lactic acid at 2.5, but now normal after hydration. Will admit overnight for IV antibiotics. She is nontoxic appearing with normal WBC and afebrile. Continue Vancomycin and Zosyn for now.   Chest pain with hx of GERD Symptoms very atypical of ACS. Prilosec dose increased last week at GI office. Will try GI cocktail, continue home dose of Sulcrafate, IV PPI BID. No acute findings on EKG, will check troponin. Myoview done 09/2015 WNL, EF 50%. Continue home ASA. Pt can likely f/u outpt with GI, but will monitor need for inpt consult. Would avoid NSAIDS. Symptoms may be exacerbated by recent prednisone rx.   Hypokalemia, mild Replete po   Fibromyalgia  She has stopped taking her Nucynta as she was concerned it was causing swelling. Will continue prn percocet for now. F/u Rhematology, pain clinic as outpt.  Chronic iron deficiency anemia Stable. Continue home meds. Follows regularly with hematology  Hx Thrombocytosis Stable. Monitor.     DVT Prophylaxis: SQ Lovenox Family Communication: none available at time of exam Disposition Plan: Pending Improvement    Patrici Ranks, NP-C [facility] Service [place]  pgr [phone]

## 2015-12-08 NOTE — ED Provider Notes (Signed)
[facility] DEPT Provider Note   CSN: [id number] Arrival date & time: 12/08/15  0413     History   Chief Complaint Chief Complaint  Patient presents with  . Chest Tightness  . Cough  . Nausea  . Gastroesophageal Reflux    HPI Dominique [name] is a 40 y.o. female with a hx of DVT, anemia, GERD, recurrent UTIs, obesity presents to the Emergency Department complaining of acute onset of choking while asleep tonight. Patient reports she has been having significant problems with acid reflux.  She reports that tonight she had a episode of acid reflux that woke her from sleep causing her to cough, choke and vomit. Afterwards she had anterior chest pain.  Patient is without shortness of breath, diaphoresis. Symptoms are worsened patient is lying flat. Record review shows that patient has been seen multiple times for persistent and severe acid reflux.  Patient also complaining of worsening pain and redness in her lower legs. She reports that she was recently diagnosed with cellulitis of her bilateral lower legs, left worse than right. She was started on cephalexin which she has been taking since 11/30/2015.  Patient reports compliance with her medications however redness has been worsening and is spreading to the posterior portion of the bilateral legs. Patient denies fevers at home but does report fatigue.   The history is provided by the patient and medical records. No language interpreter was used.    Past Medical History:  Diagnosis Date  . DVT (deep venous thrombosis) ([place])   . GERD (gastroesophageal reflux disease)   . History of recurrent UTIs   . Obesity   . Raynaud disease 12/21/2011  . Varicose veins     Patient Active Problem List   Diagnosis Date Noted  . Cellulitis 12/08/2015  . Acute upper respiratory infection 12/16/2014  . Bilateral hand pain 11/11/2014  . Dysuria 08/14/2014  . Urinary incontinence 08/14/2014  . Hematuria 08/14/2014  . Right thigh pain  08/14/2014  . UTI (urinary tract infection) 07/29/2014  . Dermatitis 07/02/2014  . Anemia, iron deficiency 03/09/2014  . Personal history of DVT (deep vein thrombosis)   . Skin lesion of breast 01/13/2014  . Fibromyalgia 12/11/2013  . Hallucinations 12/11/2013  . Total body pain 07/18/2013  . Internal hemorrhoid, bleeding 03/27/2013  . Hand swelling 12/27/2012  . 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 Cystitis, chronic 10/15/2012  . Leg swelling 06/18/2012  . Gastritis 04/02/2012  . Obesity (BMI 30-39.9) 02/05/2012  . Varicose veins of lower extremities with other complications 0000000    Past Surgical History:  Procedure Laterality Date  . ABDOMINAL HYSTERECTOMY    . APPENDECTOMY    . BALLOON DILATION N/A 04/02/2014   Procedure: BALLOON DILATION;  Surgeon: Ronald Lobo, MD;  Location: [facility];  Service: Endoscopy;  Laterality: N/A;  . CESAREAN SECTION    . COLONOSCOPY WITH PROPOFOL N/A 03/26/2012   Procedure: COLONOSCOPY WITH PROPOFOL;  Surgeon: Cleotis Nipper, MD;  Location: [facility];  Service: Endoscopy;  Laterality: N/A;  . ENDOVENOUS ABLATION SAPHENOUS VEIN W/ LASER  02-01-2012   right greater saphenous vein and stab phlebectomy 10-20 incisions right leg  by Curt Jews, MD  . ENDOVENOUS ABLATION SAPHENOUS VEIN W/ LASER Left 02-29-2012   left greater saphenous vein and stab phlebectomy left leg 10-20 incisions  by Curt Jews MD  . ENDOVENOUS ABLATION SAPHENOUS VEIN W/ LASER Left   . ESOPHAGOGASTRODUODENOSCOPY (EGD) WITH PROPOFOL N/A 03/26/2012   Procedure: ESOPHAGOGASTRODUODENOSCOPY (EGD) WITH PROPOFOL;  Surgeon: Youlanda Mighty  Buccini, MD;  Location: [facility];  Service: Endoscopy;  Laterality: N/A;  . ESOPHAGOGASTRODUODENOSCOPY (EGD) WITH PROPOFOL N/A 04/02/2014   Procedure: ESOPHAGOGASTRODUODENOSCOPY (EGD) WITH PROPOFOL;  Surgeon: Ronald Lobo, MD;  Location: [facility];  Service: Endoscopy;  Laterality: N/A;  . HERNIA REPAIR  2006    OB History    No  data available       Home Medications    Prior to Admission medications   Medication Sig Start Date End Date Taking? Authorizing Provider  acetaminophen (TYLENOL) 325 MG tablet Take 325 mg by mouth every 6 (six) hours as needed for moderate pain.    Historical Provider, MD  amitriptyline (ELAVIL) 50 MG tablet Take 100 mg by mouth at bedtime. 09/16/15   Historical Provider, MD  aspirin EC 81 MG tablet Take 1 tablet (81 mg total) by mouth daily. Patient not taking: Reported on 11/27/2015 09/01/15   Josue Hector, MD  b complex vitamins capsule Take 1 capsule by mouth daily. 10/10/15   Brunetta Genera, MD  Cyanocobalamin (B-12) 1000 MCG SUBL Place 1,000 mcg under the tongue daily. Patient not taking: Reported on 11/27/2015 10/10/15   Brunetta Genera, MD  Ferrous Sulfate (IRON) 325 (65 FE) MG TABS Take 325 mg by mouth daily. 03/09/14   Olam Idler, MD  gabapentin (NEURONTIN) 100 MG capsule Take 100 mg by mouth 3 (three) times daily. 03/25/15   Historical Provider, MD  HYDROcodone-acetaminophen (NORCO/VICODIN) 5-325 MG tablet Take 1-2 tablets by mouth every 4 (four) hours as needed for severe pain. Patient not taking: Reported on 11/27/2015 10/07/15   Olivia Canter Sam, PA-C  hydrocortisone 2.5 % ointment Apply topically 2 (two) times daily. 11/04/14   Elberta Leatherwood, MD  hydrOXYzine (VISTARIL) 25 MG capsule Take 1 capsule (25 mg total) by mouth every 6 (six) hours as needed. Patient not taking: Reported on 11/27/2015 07/02/14   Olam Idler, MD  iron polysaccharides (NIFEREX) 150 MG capsule Take 1 capsule (150 mg total) by mouth 2 (two) times daily. Patient not taking: Reported on 11/27/2015 10/10/15   Brunetta Genera, MD  meloxicam [facility]) 15 MG tablet TAKE ONE TABLET BY MOUTH EVERY DAY 03/30/15   Olam Idler, MD  mupirocin ointment (BACTROBAN) 2 % Apply to both nostrils TID for 1 month. Patient not taking: Reported on 11/27/2015 02/07/14   Harden Mo, MD  omeprazole (PRILOSEC) 40 MG  capsule TAKE ONE CAPSULE BY MOUTH TWICE DAILY BEFORE MEALS 03/30/15   Olam Idler, MD  oxyCODONE-acetaminophen (PERCOCET) 5-325 MG tablet Take 1-2 tablets by mouth every 4 (four) hours as needed. 11/24/15   Orpah Greek, MD  pantoprazole (PROTONIX) 40 MG tablet Take 1 tablet (40 mg total) by mouth daily. 11/27/15   Margette Fast, MD  predniSONE (DELTASONE) 20 MG tablet 3 tabs po daily x 3 days, then 2 tabs x 3 days, then 1.5 tabs x 3 days, then 1 tab x 3 days, then 0.5 tabs x 3 days Patient taking differently: Take 20 mg by mouth daily with breakfast.  11/24/15   Orpah Greek, MD  promethazine (PHENERGAN) 12.5 MG tablet Take 1 tablet (12.5 mg total) by mouth every 6 (six) hours as needed for nausea or vomiting. Patient not taking: Reported on 11/27/2015 12/16/14   Archie Patten, MD  QC LORATADINE ALLERGY RELIEF 10 MG tablet TAKE ONE TABLET BY MOUTH EVERY DAY Patient not taking: Reported on 11/27/2015 02/12/15   Olam Idler, MD  sucralfate (CARAFATE) 1 g tablet Take 1 tablet (1 g total) by mouth 4 (four) times daily -  with meals and at bedtime. 11/27/15   Margette Fast, MD  valACYclovir (VALTREX) 1000 MG tablet Take 0.5 tablets (500 mg total) by mouth 2 (two) times daily. Patient taking differently: Take 500 mg by mouth 2 (two) times daily as needed (for flareups).  12/01/14   Olam Idler, MD    Family History Family History  Problem Relation Age of Onset  . Diabetes Mother   . Heart disease Mother   . Hyperlipidemia Mother   . Hypertension Mother   . Other Mother     amputation, varicose veins  . Diabetes Father   . Hyperlipidemia Father   . Hypertension Father   . Peripheral vascular disease Father   . Other Father     varicose veins  . Deep vein thrombosis Brother   . Other Brother     varicose veins    Social History Social History  Substance Use Topics  . Smoking status: Never Smoker  . Smokeless tobacco: Never Used  . Alcohol use No      Allergies   Contrast media [iodinated diagnostic agents]; Latex; Nsaids; Doxycycline; Eggs or egg-derived products; Lactose intolerance (gi); and Zofran [ondansetron hcl]   Review of Systems Review of Systems  Respiratory: Positive for cough.   Gastrointestinal: Positive for vomiting.  Skin: Positive for color change.  All other systems reviewed and are negative.    Physical Exam Updated Vital Signs BP 104/68 (BP Location: Left Arm)   Pulse 103   Temp 98.4 F (36.9 C) (Oral)   Resp 16   Ht 4\' 7"  (1.397 m)   Wt 96.2 kg   SpO2 98%   BMI 49.27 kg/m   Physical Exam  Constitutional: She appears well-developed and well-nourished. No distress.  Awake, alert, nontoxic appearance  HENT:  Head: Normocephalic and atraumatic.  Mouth/Throat: Oropharynx is clear and moist. No oropharyngeal exudate.  Eyes: Conjunctivae are normal. No scleral icterus.  Neck: Normal range of motion. Neck supple.  Cardiovascular: Regular rhythm and intact distal pulses.  Tachycardia present.   Pulmonary/Chest: Effort normal and breath sounds normal. No respiratory distress. She has no wheezes.  Equal chest expansion  Abdominal: Soft. Bowel sounds are normal. She exhibits no mass. There is no tenderness. There is no rebound and no guarding.  Musculoskeletal: Normal range of motion. She exhibits no edema.  Neurological: She is alert.  Speech is clear and goal oriented Moves extremities without ataxia  Skin: Skin is warm and dry. She is not diaphoretic.  Bilateral lower legs with erythema and increased warmth - left leg area is almost circumferential, right leg is smaller in size and mostly anterior; no discrete abscess  Psychiatric: She has a normal mood and affect.  Nursing note and vitals reviewed.    ED Treatments / Results  Labs (all labs ordered are listed, but only abnormal results are displayed) Labs Reviewed  COMPREHENSIVE METABOLIC PANEL - Abnormal; Notable for the following:        Result Value   Potassium 3.3 (*)    Glucose, Bld 114 (*)    Calcium 8.5 (*)    Total Protein 6.3 (*)    Albumin 3.0 (*)    All other components within normal limits  CBC WITH DIFFERENTIAL/PLATELET - Abnormal; Notable for the following:    Hemoglobin 10.5 (*)    HCT 34.2 (*)    MCH 25.7 (*)  RDW 16.0 (*)    All other components within normal limits  I-STAT CG4 LACTIC ACID, ED - Abnormal; Notable for the following:    Lactic Acid, Venous 2.51 (*)    All other components within normal limits  CULTURE, BLOOD (ROUTINE X 2)  CULTURE, BLOOD (ROUTINE X 2)  URINE CULTURE  BRAIN NATRIURETIC PEPTIDE  URINALYSIS, ROUTINE W REFLEX MICROSCOPIC (NOT AT [facility])  PREGNANCY, URINE  I-STAT TROPOININ, ED    EKG  EKG Interpretation  Date/Time:  Wednesday December 08 2015 04:18:14 EST Ventricular Rate:  100 PR Interval:    QRS Duration: 101 QT Interval:  364 QTC Calculation: 470 R Axis:   1 Text Interpretation:  Sinus tachycardia No significant change since last tracing Confirmed by WARD,  DO, KRISTEN [phone]) on 12/08/2015 4:23:47 AM       Radiology Dg Chest 2 View  Result Date: 12/08/2015 CLINICAL DATA:  Acute onset of cough, shortness of breath and generalized chest pain. Initial encounter. EXAM: CHEST  2 VIEW COMPARISON:  Chest radiograph performed 08/29/2015 FINDINGS: The lungs are hypoexpanded but appear grossly clear. There is no evidence of focal opacification, pleural effusion or pneumothorax. The heart is normal in size; the mediastinal contour is within normal limits. No acute osseous abnormalities are seen. IMPRESSION: Lungs hypoexpanded but grossly clear. Electronically Signed   By: Garald Balding M.D.   On: 12/08/2015 06:12    Procedures Procedures (including critical care time)  Medications Ordered in ED Medications  0.9 %  sodium chloride infusion (1,000 mLs Intravenous New Bag/Given 12/08/15 0611)  vancomycin (VANCOCIN) 1,500 mg in sodium chloride 0.9 % 500 mL IVPB  (1,500 mg Intravenous New Bag/Given 12/08/15 0704)  piperacillin-tazobactam (ZOSYN) IVPB 3.375 g (not administered)  vancomycin (VANCOCIN) IVPB 1000 mg/200 mL premix (not administered)  piperacillin-tazobactam (ZOSYN) IVPB 3.375 g (0 g Intravenous Stopped 12/08/15 0719)  morphine 4 MG/ML injection 2 mg (2 mg Intravenous Given 12/08/15 0640)  potassium chloride SA (K-DUR,KLOR-CON) CR tablet 40 mEq (40 mEq Oral Given 12/08/15 0700)  pantoprazole (PROTONIX) EC tablet 40 mg (40 mg Oral Given 12/08/15 0701)  sodium chloride 0.9 % bolus 1,000 mL (1,000 mLs Intravenous New Bag/Given 12/08/15 0713)     Initial Impression / Assessment and Plan / ED Course  I have reviewed the triage vital signs and the nursing notes.  Pertinent labs & imaging results that were available during my care of the patient were reviewed by me and considered in my medical decision making (see chart for details).  Clinical Course as of Dec 07 724  Wed Dec 08, 2015  [id number] Mild, repletion begun Potassium: (!) 3.3 [HM]  0647 Elevated Lactic Acid, Venous: (!!) 2.51 [HM]  0647 Anemia at baseline Hemoglobin: (!) 10.5 [HM]  0726 Discussed with triad who will admit.  Tele-obs  [HM]  0726 Tachycardia Pulse Rate: 103 [HM]    Clinical Course User Index [HM] Abigail Butts, PA-C    Patient presents after episode of severe reflux. Chest x-rays without evidence of pneumonia. Patient is afebrile. Troponin negative. EKG reassuring. Patient given Protonix in the emergency department.  Patient also with worsening cellulitis of her bilateral lower extremities. She has failed outpatient therapy at this time. She has an elevated lactic acid and is tachycardic. Fluids given, but not 30cc/kg as pt is not hypotensive or severe sepsis. Antibiotics started. Blood cultures obtained.  Pt will need admission.    Final Clinical Impressions(s) / ED Diagnoses   Final diagnoses:  Gastroesophageal reflux disease, esophagitis presence  not  specified  Cellulitis of lower extremity, unspecified laterality  Chest pain, unspecified type    New Prescriptions New Prescriptions   No medications on file     Abigail Butts, PA-C 12/08/15 Custar Ward, DO 12/08/15 [phone]

## 2015-12-08 NOTE — ED Notes (Signed)
Patient taking Cephalexin 750mg  every 6 hours for 5 days (started 11/21 but was only taking 250mg  3 times a day) for her cellulitis in her lower legs.  Areas marked.

## 2015-12-08 NOTE — ED Notes (Signed)
Report attempted 

## 2015-12-09 DIAGNOSIS — E876 Hypokalemia: Secondary | ICD-10-CM

## 2015-12-09 DIAGNOSIS — M797 Fibromyalgia: Secondary | ICD-10-CM

## 2015-12-09 DIAGNOSIS — D509 Iron deficiency anemia, unspecified: Secondary | ICD-10-CM

## 2015-12-09 DIAGNOSIS — I872 Venous insufficiency (chronic) (peripheral): Secondary | ICD-10-CM

## 2015-12-09 DIAGNOSIS — K219 Gastro-esophageal reflux disease without esophagitis: Secondary | ICD-10-CM

## 2015-12-09 LAB — CBC
HEMATOCRIT: 31.9 % — AB (ref 36.0–46.0)
HEMOGLOBIN: 9.8 g/dL — AB (ref 12.0–15.0)
MCH: 25.7 pg — ABNORMAL LOW (ref 26.0–34.0)
MCHC: 30.7 g/dL (ref 30.0–36.0)
MCV: 83.7 fL (ref 78.0–100.0)
Platelets: 374 10*3/uL (ref 150–400)
RBC: 3.81 MIL/uL — ABNORMAL LOW (ref 3.87–5.11)
RDW: 16.3 % — ABNORMAL HIGH (ref 11.5–15.5)
WBC: 6.6 10*3/uL (ref 4.0–10.5)

## 2015-12-09 LAB — BASIC METABOLIC PANEL
ANION GAP: 6 (ref 5–15)
BUN: <5 mg/dL — ABNORMAL LOW (ref 6–20)
CHLORIDE: 107 mmol/L (ref 101–111)
CO2: 24 mmol/L (ref 22–32)
Calcium: 8.1 mg/dL — ABNORMAL LOW (ref 8.9–10.3)
Creatinine, Ser: 0.66 mg/dL (ref 0.44–1.00)
GFR calc non Af Amer: >60 mL/min (ref >60)
Glucose, Bld: 116 mg/dL — ABNORMAL HIGH (ref 65–99)
Potassium: 3.8 mmol/L (ref 3.5–5.1)
Sodium: 137 mmol/L (ref 135–145)

## 2015-12-09 LAB — URINE CULTURE

## 2015-12-09 LAB — TROPONIN I: Troponin I: <0.03 ng/mL (ref <0.03)

## 2015-12-09 MED ORDER — FLUCONAZOLE 150 MG PO TABS
150.0000 mg | ORAL_TABLET | Freq: Once | ORAL | Status: AC
  Administered 2015-12-09: 150 mg via ORAL
  Filled 2015-12-09: qty 1

## 2015-12-09 MED ORDER — TAPENTADOL HCL 50 MG PO TABS
50.0000 mg | ORAL_TABLET | ORAL | Status: DC | PRN
  Administered 2015-12-09: 50 mg via ORAL
  Filled 2015-12-09: qty 1

## 2015-12-09 MED ORDER — VALACYCLOVIR HCL 1 G PO TABS: 500.0000 mg | ORAL_TABLET | Freq: Two times a day (BID) | ORAL | Status: DC | PRN

## 2015-12-09 NOTE — Progress Notes (Signed)
Pt made aware that she has d/c orders. Requested to have her nucynta prior to.  Med not in pyxis at this time spoke with pharmacist and stated that she will send someone to load the pyxis.  Pt made aware.   Karie Kirks, RN,

## 2015-12-09 NOTE — Discharge Instructions (Signed)
Heartburn Heartburn is a type of pain or discomfort that can happen in the throat or chest. It is often described as a burning pain. It may also cause a bad taste in the mouth. Heartburn may feel worse when you lie down or bend over, and it is often worse at night. Heartburn may be caused by stomach contents that move back up into the esophagus (reflux). Follow these instructions at home: Take these actions to decrease your discomfort and to help avoid complications. Diet  Follow a diet as recommended by your health care provider. This may involve avoiding foods and drinks such as: ? Coffee and tea (with or without caffeine). ? Drinks that contain alcohol. ? Energy drinks and sports drinks. ? Carbonated drinks or sodas. ? Chocolate and cocoa. ? Peppermint and mint flavorings. ? Garlic and onions. ? Horseradish. ? Spicy and acidic foods, including peppers, chili powder, curry powder, vinegar, hot sauces, and barbecue sauce. ? Citrus fruit juices and citrus fruits, such as oranges, lemons, and limes. ? Tomato-based foods, such as red sauce, chili, salsa, and pizza with red sauce. ? Fried and fatty foods, such as donuts, french fries, potato chips, and high-fat dressings. ? High-fat meats, such as hot dogs and fatty cuts of red and white meats, such as rib eye steak, sausage, ham, and bacon. ? High-fat dairy items, such as whole milk, butter, and cream cheese.  Eat small, frequent meals instead of large meals.  Avoid drinking large amounts of liquid with your meals.  Avoid eating meals during the 2-3 hours before bedtime.  Avoid lying down right after you eat.  Do not exercise right after you eat. General instructions  Pay attention to any changes in your symptoms.  Take over-the-counter and prescription medicines only as told by your health care provider. Do not take aspirin, ibuprofen, or other NSAIDs unless your health care provider told you to do so.  Do not use any tobacco  products, including cigarettes, chewing tobacco, and e-cigarettes. If you need help quitting, ask your health care provider.  Wear loose-fitting clothing. Do not wear anything tight around your waist that causes pressure on your abdomen.  Raise (elevate) the head of your bed about 6 inches (15 cm).  Try to reduce your stress, such as with yoga or meditation. If you need help reducing stress, ask your health care provider.  If you are overweight, reduce your weight to an amount that is healthy for you. Ask your health care provider for guidance about a safe weight loss goal.  Keep all follow-up visits as told by your health care provider. This is important. Contact a health care provider if:  You have new symptoms.  You have unexplained weight loss.  You have difficulty swallowing, or it hurts to swallow.  You have wheezing or a persistent cough.  Your symptoms do not improve with treatment.  You have frequent heartburn for more than two weeks. Get help right away if:  You have pain in your arms, neck, jaw, teeth, or back.  You feel sweaty, dizzy, or light-headed.  You have chest pain or shortness of breath.  You vomit and your vomit looks like blood or coffee grounds.  Your stool is bloody or black. This information is not intended to replace advice given to you by your health care provider. Make sure you discuss any questions you have with your health care provider. Document Released: 05/14/2008 Document Revised: 06/03/2015 Document Reviewed: 04/22/2014 Elsevier Interactive Patient Education  2017 [employer]  

## 2015-12-09 NOTE — Discharge Summary (Signed)
Physician Discharge Summary  Dominique [name] [id number] DOB: 05/12/1975  PCP: Eloise Levels, NP  Admit date: 12/08/2015 Discharge date: 12/09/2015   Recommendations for Outpatient Follow-up:  1. Eloise Levels, NP/PCP in 4 days with repeat labs (CBC & BMP). Please follow final blood culture results that were sent from the hospital. 2. Dr. Ronald Lobo, [facility] in 1 week.  Home Health: None Equipment/Devices: None    Discharge Condition: Improved and stable.  CODE STATUS: Full  Diet recommendation: Heart Healthy diet.  Discharge Diagnoses:  Active Problems:   Obesity (BMI 30-39.9)   Fibromyalgia   Personal history of DVT (deep vein thrombosis)   Anemia, iron deficiency   Cellulitis   H/O gastroesophageal reflux (GERD)   Hypokalemia   Thrombocytosis (HCC)   Brief/Interim Summary: 40 year old female with a PMH of fibromyalgia (sees outpatient pain management), GERD/esophageal stricture status post dilatation 03/2014, NSAID gastritis per EGD 03/2012 (sees [facility] Dr. Cristina Gong), chronic iron deficiency anemia, Raynaud's syndrome (follows with Rheumatology/Dr. Charlestine Night), DVT during pregnancy, hemorrhoids, obesity, presented to ED with complaints of episodes of nonbloody emesis 2 nights. Pt states chest "burning" has awoken her from sleep last 2 nights. This has happened to her before and feels like reflux. States she has a "yucky taste in her mouth" like previous episodes of reflux. Of note, pt was seen by [facility] last week at which time her PPI dose was increased. She is due to f/u in one month. She takes occasional prednisone for unspecified "flares" of joint pain and erythema, but has not had this in approximately 2 weeks. She denies any SOB, palpitations, hematemesis, abdominal pain, melena, hematachezia. Was actually called by ED for suspected cellulitis of lower extremities. Pt was seen in ED last week for similar symptoms and put on Keflex. She describes chronic bilateral  lower extremity edema and redness which occurs after an hour of standing or sitting and is associated with pain. She has had vein ablation procedures in her lower extremities (Dr. Donnetta Hutching). She reported worsening redness and pain on arrival in the right lower extremity. 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 Her symptoms improved on lying down. She was admitted for presumed lower extremity cellulitis and atypical chest pain most likely related to her symptomatic GERD.  Assessment and plan:  1. Bilateral lower extremity swelling: Her symptoms and clinical exam are suspicious for chronic venous stasis and not convincing for cellulitis. Apart from some edema of her lower extremities, today's exam is not significant for cellulitis. She obviously has been in bed since admission. Recommend lower extremity compression stockings. Complete outpatient course of Keflex. Recommend outpatient follow-up with vascular surgery to see if there are any other treatment options. She was counseled regarding taking breaks during her job and sitting down/laying down and putting up her feet to minimize symptoms. She verbalized understanding. CHF less likely. BNP is 14.5 on admission. Hypoalbuminemia may also be contributing to her leg edema. Unclear etiology of the hypoalbuminemia? Nutritional. 2. GERD/atypical chest pain: She has prior history of esophageal stricture and dilatation, NSAID-induced gastritis. Her symptoms are classic for her GERD. Recently seen by outpatient GI and her Prilosec was increased. She is not on sucralfate. Protonix was discontinued. Symptoms not typical for cardiac etiology chest pain. EKG showed sinus tachycardia at 100 without acute findings. Troponin 2 negative. Myoview 09/2015 was unremarkable. Tolerating diet. No further chest pain since last night. Outpatient follow-up with GI. Urine pregnancy test was negative.  3. Hypokalemia: Replaced.  4. Chronic pain/fibromyalgia: She  states that she follows with outpatient pain management and rheumatology. She had voluntarily stopped Nucynta thinking that that was causing her swelling but her Percocet is not controlling her pain and thereby she states that she will be resuming her Nucynta. 5. Chronic iron deficiency anemia: Hemoglobin stable. Serum iron recently was 18 and ferritin 14. B12: 416.   Discharge Instructions  Discharge Instructions    Call MD for:  difficulty breathing, headache or visual disturbances    Complete by:  As directed    Call MD for:  extreme fatigue    Complete by:  As directed    Call MD for:  persistant dizziness or light-headedness    Complete by:  As directed    Call MD for:  persistant nausea and vomiting    Complete by:  As directed    Call MD for:  redness, tenderness, or signs of infection (pain, swelling, redness, odor or green/yellow discharge around incision site)    Complete by:  As directed    Call MD for:  severe uncontrolled pain    Complete by:  As directed    Call MD for:  temperature >100.4    Complete by:  As directed    Diet - low sodium heart healthy    Complete by:  As directed    Increase activity slowly    Complete by:  As directed        Medication List    STOP taking these medications   oxyCODONE-acetaminophen 5-325 MG tablet Commonly known as:  PERCOCET   pantoprazole 40 MG tablet Commonly known as:  PROTONIX     TAKE these medications   aspirin EC 81 MG tablet Take 1 tablet (81 mg total) by mouth daily.   b complex vitamins capsule Take 1 capsule by mouth daily.   cephALEXin 250 MG capsule Commonly known as:  KEFLEX Take 250 mg by mouth 3 (three) times daily.   hydrocortisone 2.5 % ointment Apply topically 2 (two) times daily.   Iron 325 (65 Fe) MG Tabs Take 325 mg by mouth daily.   NUCYNTA 50 MG tablet Generic drug:  tapentadol Take 50 mg by mouth every 4 (four) hours as needed.   omeprazole 40 MG capsule Commonly known as:   PRILOSEC TAKE ONE CAPSULE BY MOUTH TWICE DAILY BEFORE MEALS   valACYclovir 1000 MG tablet Commonly known as:  VALTREX Take 0.5 tablets (500 mg total) by mouth 2 (two) times daily as needed (for flareups).      Follow-up Information    Eloise Levels, NP. Schedule an appointment as soon as possible for a visit in 4 day(s).   Specialty:  Nurse Practitioner Why:  To be seen with repeat labs (CBC & BMP). Contact information: [id number] [address] [id number]        Cleotis Nipper, MD. Schedule an appointment as soon as possible for a visit in 1 week(s).   Specialty:  Gastroenterology Why:   follow-up regarding recurrent GERD symptoms.  Contact information: [address] [place] [address] [phone]          Allergies  Allergen Reactions  . Contrast Media [Iodinated Diagnostic Agents] Anaphylaxis and Swelling    Tongue swelling  . Latex Anaphylaxis  . Nsaids Other (See Comments)    Worsened gastritis; 02/22/14 GI bleed  . Doxycycline Other (See Comments)    MADE LEGS HURT  . Eggs Or Egg-Derived Products Nausea And Vomiting  .  Lactose Intolerance (Gi) Diarrhea  . Zofran [Ondansetron Hcl] Other (See Comments)    Hives, numbness of legs with previous use    Consultations:  None   Procedures/Studies: Dg Chest 2 View  Result Date: 12/08/2015 CLINICAL DATA:  Acute onset of cough, shortness of breath and generalized chest pain. Initial encounter. EXAM: CHEST  2 VIEW COMPARISON:  Chest radiograph performed 08/29/2015 FINDINGS: The lungs are hypoexpanded but appear grossly clear. There is no evidence of focal opacification, pleural effusion or pneumothorax. The heart is normal in size; the mediastinal contour is within normal limits. No acute osseous abnormalities are seen. IMPRESSION: Lungs hypoexpanded but grossly clear. Electronically Signed   By: Garald Balding M.D.   On: 12/08/2015 06:12      Subjective: Feels much better.  No further nausea, vomiting, heartburn or chest pain since last night. Tolerating diet. Leg swelling, pain and redness much improved. No other complaints reported.  Discharge Exam:  Vitals:   12/08/15 2006 12/09/15 0342 12/09/15 0809 12/09/15 1127  BP: 103/66 (!) 98/50 (!) 92/59 (!) 97/56  Pulse: 97 99 98 94  Resp: 16 18 16 18   Temp:  98.7 F (37.1 C) 98.8 F (37.1 C) 98.2 F (36.8 C)  TempSrc: Oral Oral Oral Oral  SpO2: 99% 100% 98% 98%  Weight: 97.5 kg (215 lb) 97.3 kg (214 lb 8 oz)    Height: 4\' 7"  (1.397 m)       General: Pt lying comfortably in bed & appears in no obvious distress. Cardiovascular: S1 & S2 heard, RRR, S1/S2 +. No murmurs, rubs, gallops or clicks. No JVD. Telemetry: Sinus rhythm in the 90s-sinus tachycardia in the low 100s. Respiratory: Clear to auscultation without wheezing, rhonchi or crackles. No increased work of breathing. Abdominal:  Non distended, non tender & soft. No organomegaly or masses appreciated. Normal bowel sounds heard. CNS: Alert and oriented. No focal deficits. Extremities: trace bilateral leg edema. Very faint patchy symmetrical erythema in the lower anterior legs without increased warmth or tenderness.     The results of significant diagnostics from this hospitalization (including imaging, microbiology, ancillary and laboratory) are listed below for reference.     Microbiology: Recent Results (from the past 240 hour(s))  Urine culture     Status: Abnormal   Collection Time: 12/08/15  6:22 AM  Result Value Ref Range Status   Specimen Description URINE, RANDOM  Final   Special Requests NONE  Final   Culture MULTIPLE SPECIES PRESENT, SUGGEST RECOLLECTION (A)  Final   Report Status 12/09/2015 FINAL  Final     Labs: BNP (last 3 results)  Recent Labs  08/29/15 1723 12/08/15 0546  BNP 21.9 [id number]   Basic Metabolic Panel:  Recent Labs Lab 12/08/15 0545 12/08/15 1356 12/09/15 0542  NA 139  --  137  K 3.3*  --  3.8  CL 103  --   107  CO2 24  --  24  GLUCOSE 114*  --  116*  BUN 8  --  <5*  CREATININE 0.76 0.70 0.66  CALCIUM 8.5*  --  8.1*   Liver Function Tests:  Recent Labs Lab 12/08/15 0545  AST 24  ALT 16  ALKPHOS 80  BILITOT 0.4  PROT 6.3*  ALBUMIN 3.0*   No results for input(s): LIPASE, AMYLASE in the last 168 hours. No results for input(s): AMMONIA in the last 168 hours. CBC:  Recent Labs Lab 12/08/15 0545 12/08/15 1356 12/09/15 0542  WBC 9.5 9.7 6.6  NEUTROABS 5.3  --   --  HGB 10.5* 9.6* 9.8*  HCT 34.2* 31.2* 31.9*  MCV 83.8 83.9 83.7  PLT 394 398 374   Cardiac Enzymes:  Recent Labs Lab 12/08/15 1356 12/09/15 0542  TROPONINI <0.03 <0.03   Urinalysis    Component Value Date/Time   COLORURINE YELLOW 12/08/2015 0622   APPEARANCEUR CLEAR 12/08/2015 0622   LABSPEC 1.014 12/08/2015 0622   PHURINE 5.5 12/08/2015 0622   GLUCOSEU NEGATIVE 12/08/2015 0622   HGBUR TRACE (A) 12/08/2015 0622   BILIRUBINUR NEGATIVE 12/08/2015 0622   BILIRUBINUR NEG 08/14/2014 1520   KETONESUR NEGATIVE 12/08/2015 0622   PROTEINUR NEGATIVE 12/08/2015 0622   UROBILINOGEN 0.2 08/14/2014 1520   UROBILINOGEN 0.2 08/10/2014 0013   NITRITE NEGATIVE 12/08/2015 0622   LEUKOCYTESUR NEGATIVE 12/08/2015 0622      Time coordinating discharge: Over 30 minutes  SIGNED:  Vernell Leep, MD, FACP, FHM. [facility] Pager [phone]  If 7PM-7AM, please contact night-coverage [email or web address] Password [email or web address] 12/09/2015, 12:09 PM

## 2015-12-10 NOTE — Progress Notes (Signed)
Late entry at 1445 on 12/09/15 all d/c instructions explained and given to pt.  Verbalized understanding. Declined w/c service.  Ambulate to awaiting transport.

## 2015-12-13 LAB — CULTURE, BLOOD (ROUTINE X 2)
CULTURE: NO GROWTH
Culture: NO GROWTH

## 2015-12-16 ENCOUNTER — Ambulatory Visit: Payer: BLUE CROSS/BLUE SHIELD | Admitting: Hematology

## 2016-01-07 ENCOUNTER — Encounter (HOSPITAL_COMMUNITY): Payer: Self-pay | Admitting: Emergency Medicine

## 2016-01-07 ENCOUNTER — Emergency Department (HOSPITAL_COMMUNITY)
Admission: EM | Admit: 2016-01-07 | Discharge: 2016-01-07 | Disposition: A | Discharge status: Home / Self Care | Payer: BLUE CROSS/BLUE SHIELD | Attending: Emergency Medicine | Admitting: Emergency Medicine

## 2016-01-07 ENCOUNTER — Emergency Department (HOSPITAL_COMMUNITY): Payer: BLUE CROSS/BLUE SHIELD

## 2016-01-07 DIAGNOSIS — E876 Hypokalemia: Secondary | ICD-10-CM | POA: Diagnosis not present

## 2016-01-07 DIAGNOSIS — I509 Heart failure, unspecified: Secondary | ICD-10-CM | POA: Diagnosis not present

## 2016-01-07 DIAGNOSIS — K219 Gastro-esophageal reflux disease without esophagitis: Secondary | ICD-10-CM | POA: Diagnosis not present

## 2016-01-07 DIAGNOSIS — Z79899 Other long term (current) drug therapy: Secondary | ICD-10-CM | POA: Insufficient documentation

## 2016-01-07 DIAGNOSIS — Z91040 Latex allergy status: Secondary | ICD-10-CM | POA: Insufficient documentation

## 2016-01-07 DIAGNOSIS — R609 Edema, unspecified: Secondary | ICD-10-CM

## 2016-01-07 DIAGNOSIS — R600 Localized edema: Secondary | ICD-10-CM | POA: Diagnosis not present

## 2016-01-07 DIAGNOSIS — R079 Chest pain, unspecified: Secondary | ICD-10-CM | POA: Diagnosis present

## 2016-01-07 DIAGNOSIS — Z7982 Long term (current) use of aspirin: Secondary | ICD-10-CM | POA: Insufficient documentation

## 2016-01-07 HISTORY — DX: Heart failure, unspecified: I50.9

## 2016-01-07 LAB — CBC
HCT: 34.5 % — ABNORMAL LOW (ref 36.0–46.0)
Hemoglobin: 10.6 g/dL — ABNORMAL LOW (ref 12.0–15.0)
MCH: 25.2 pg — ABNORMAL LOW (ref 26.0–34.0)
MCHC: 30.7 g/dL (ref 30.0–36.0)
MCV: 81.9 fL (ref 78.0–100.0)
PLATELETS: 509 10*3/uL — AB (ref 150–400)
RBC: 4.21 MIL/uL (ref 3.87–5.11)
RDW: 16.1 % — ABNORMAL HIGH (ref 11.5–15.5)
WBC: 9.6 10*3/uL (ref 4.0–10.5)

## 2016-01-07 LAB — I-STAT TROPONIN, ED: TROPONIN I, POC: 0 ng/mL (ref 0.00–0.08)

## 2016-01-07 LAB — BASIC METABOLIC PANEL
Anion gap: 11 (ref 5–15)
BUN: 9 mg/dL (ref 6–20)
CALCIUM: 8.8 mg/dL — AB (ref 8.9–10.3)
CO2: 26 mmol/L (ref 22–32)
CREATININE: 0.72 mg/dL (ref 0.44–1.00)
Chloride: 101 mmol/L (ref 101–111)
Glucose, Bld: 109 mg/dL — ABNORMAL HIGH (ref 65–99)
Potassium: 3.2 mmol/L — ABNORMAL LOW (ref 3.5–5.1)
SODIUM: 138 mmol/L (ref 135–145)

## 2016-01-07 LAB — D-DIMER, QUANTITATIVE (NOT AT ARMC): D DIMER QUANT: 0.77 ug{FEU}/mL — AB (ref 0.00–0.50)

## 2016-01-07 MED ORDER — POTASSIUM CHLORIDE CRYS ER 20 MEQ PO TBCR
40.0000 meq | EXTENDED_RELEASE_TABLET | Freq: Once | ORAL | Status: AC
  Administered 2016-01-07: 40 meq via ORAL
  Filled 2016-01-07: qty 2

## 2016-01-07 MED ORDER — ENOXAPARIN SODIUM 100 MG/ML ~~LOC~~ SOLN
100.0000 mg | Freq: Once | SUBCUTANEOUS | Status: AC
  Administered 2016-01-07: 100 mg via SUBCUTANEOUS
  Filled 2016-01-07: qty 1

## 2016-01-07 NOTE — ED Triage Notes (Addendum)
Pt complaint of nasal congestion with "swallowing mucus," bilateral arm tingling/numbness, and bilateral leg itching and swelling for a week. Pt denies CP or SOB. Pt hx of CHF and DVT. Pt denies weakness and is a/o x4.

## 2016-01-07 NOTE — ED Notes (Signed)
Patient ambulated out of facility before she received her discharge instructions. Called her home number, left a message about discharge instructions and to call back facility.

## 2016-01-07 NOTE — Discharge Instructions (Signed)
Come to the hospital tomorrow as directed to get your studies to check for blood clots in your legs. He can follow up with your primary care physician regarding  GERD or take all of your medications as prescribed.

## 2016-01-07 NOTE — ED Notes (Signed)
[facility] and aware of pt complaint, status, and protocol orders place. This Probation officer questioned if other orders need placed related to wait time.

## 2016-01-07 NOTE — ED Provider Notes (Addendum)
[facility] DEPT Provider Note   CSN: [id number] Arrival date & time: 01/07/16  1611     History   Chief Complaint Chief Complaint  Patient presents with  . Multiple Complaints    HPI Dominique [name] is a 40 y.o. female.Patient complains of progressively worsening bilateral leg swelling for the past 3 weeks "to the point where"I couldn't get my socks on today" she also reports that her acid reflux has been flaring up. When she lies flat she feels acid wash in  into her throat causing her to choke sometimes. She also complains of itching of both of her feet for the past 3 weeks. She denies shortness of breath and denies nasal congestion. Patient was admitted November 2017 and discharge 12/09/2015, she had at that time bilateral lower extremity swelling felt secondary to hypoalbuminemia. She had admission BNP which was 14.5. She is uncertain whether she ha history of congestive heart failure. No treatment prior to coming here. Chest pain, i.e. feeling of acid wash in into her esophagus is worse with lying supine and improved with sitting up typical of GERD she's experienced chronically. No other associated symptoms  HPI  Past Medical History:  Diagnosis Date  . CHF (congestive heart failure) ([place])   . DVT (deep venous thrombosis) ([place])   . GERD (gastroesophageal reflux disease)   . History of recurrent UTIs   . Obesity   . Raynaud disease 12/21/2011  . Varicose veins     Patient Active Problem List   Diagnosis Date Noted  . Cellulitis 12/08/2015  . H/O gastroesophageal reflux (GERD) 12/08/2015  . Hypokalemia 12/08/2015  . Thrombocytosis ([place]) 12/08/2015  . Acute upper respiratory infection 12/16/2014  . Bilateral hand pain 11/11/2014  . Dysuria 08/14/2014  . Urinary incontinence 08/14/2014  . Hematuria 08/14/2014  . Right thigh pain 08/14/2014  . UTI (urinary tract infection) 07/29/2014  . Dermatitis 07/02/2014  . Anemia, iron deficiency 03/09/2014  . Personal  history of DVT (deep vein thrombosis)   . Skin lesion of breast 01/13/2014  . Fibromyalgia 12/11/2013  . Hallucinations 12/11/2013  . Total body pain 07/18/2013  . Internal hemorrhoid, bleeding 03/27/2013  . Hand swelling 12/27/2012  . 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 Cystitis, chronic 10/15/2012  . Leg swelling 06/18/2012  . Gastritis 04/02/2012  . Obesity (BMI 30-39.9) 02/05/2012  . Varicose veins of lower extremities with other complications 0000000    Past Surgical History:  Procedure Laterality Date  . ABDOMINAL HYSTERECTOMY    . APPENDECTOMY    . BALLOON DILATION N/A 04/02/2014   Procedure: BALLOON DILATION;  Surgeon: Ronald Lobo, MD;  Location: [facility];  Service: Endoscopy;  Laterality: N/A;  . CESAREAN SECTION    . COLONOSCOPY WITH PROPOFOL N/A 03/26/2012   Procedure: COLONOSCOPY WITH PROPOFOL;  Surgeon: Cleotis Nipper, MD;  Location: [facility];  Service: Endoscopy;  Laterality: N/A;  . ENDOVENOUS ABLATION SAPHENOUS VEIN W/ LASER  02-01-2012   right greater saphenous vein and stab phlebectomy 10-20 incisions right leg  by Curt Jews, MD  . ENDOVENOUS ABLATION SAPHENOUS VEIN W/ LASER Left 02-29-2012   left greater saphenous vein and stab phlebectomy left leg 10-20 incisions  by Curt Jews MD  . ENDOVENOUS ABLATION SAPHENOUS VEIN W/ LASER Left   . ESOPHAGOGASTRODUODENOSCOPY (EGD) WITH PROPOFOL N/A 03/26/2012   Procedure: ESOPHAGOGASTRODUODENOSCOPY (EGD) WITH PROPOFOL;  Surgeon: Cleotis Nipper, MD;  Location: [facility];  Service: Endoscopy;  Laterality: N/A;  . ESOPHAGOGASTRODUODENOSCOPY (EGD) WITH PROPOFOL N/A 04/02/2014   Procedure:  ESOPHAGOGASTRODUODENOSCOPY (EGD) WITH PROPOFOL;  Surgeon: Ronald Lobo, MD;  Location: [facility];  Service: Endoscopy;  Laterality: N/A;  . HERNIA REPAIR  2006    OB History    No data available       Home Medications    Prior to Admission medications   Medication Sig Start Date End Date Taking? Authorizing  Provider  aspirin EC 81 MG tablet Take 1 tablet (81 mg total) by mouth daily. 09/01/15   Josue Hector, MD  b complex vitamins capsule Take 1 capsule by mouth daily. 10/10/15   Brunetta Genera, MD  cephALEXin (KEFLEX) 250 MG capsule Take 250 mg by mouth 3 (three) times daily. 11/30/15   Historical Provider, MD  Ferrous Sulfate (IRON) 325 (65 FE) MG TABS Take 325 mg by mouth daily. 03/09/14   Olam Idler, MD  hydrocortisone 2.5 % ointment Apply topically 2 (two) times daily. 11/04/14   Elberta Leatherwood, MD  omeprazole (PRILOSEC) 40 MG capsule TAKE ONE CAPSULE BY MOUTH TWICE DAILY BEFORE MEALS 03/30/15   Olam Idler, MD  tapentadol (NUCYNTA) 50 MG tablet Take 50 mg by mouth every 4 (four) hours as needed.    Historical Provider, MD  valACYclovir (VALTREX) 1000 MG tablet Take 0.5 tablets (500 mg total) by mouth 2 (two) times daily as needed (for flareups). 12/09/15   Modena Jansky, MD    Family History Family History  Problem Relation Age of Onset  . Diabetes Mother   . Heart disease Mother   . Hyperlipidemia Mother   . Hypertension Mother   . Other Mother     amputation, varicose veins  . Diabetes Father   . Hyperlipidemia Father   . Hypertension Father   . Peripheral vascular disease Father   . Other Father     varicose veins  . Deep vein thrombosis Brother   . Other Brother     varicose veins    Social History Social History  Substance Use Topics  . Smoking status: Never Smoker  . Smokeless tobacco: Never Used  . Alcohol use No     Allergies   Contrast media [iodinated diagnostic agents]; Latex; Nsaids; Doxycycline; Eggs or egg-derived products; Lactose intolerance (gi); and Zofran [ondansetron hcl]   Review of Systems Review of Systems  Constitutional: Negative.   HENT: Negative.   Respiratory: Negative.   Cardiovascular: Positive for chest pain and leg swelling.  Gastrointestinal: Negative.   Musculoskeletal: Negative.   Skin:       Itching  Neurological:  Negative.   Psychiatric/Behavioral: Negative.      Physical Exam Updated Vital Signs BP (!) 165/118 (BP Location: Left Arm)   Pulse 118   Temp 98.3 F (36.8 C) (Oral)   Resp 18   Ht 4\' 7"  (1.397 m)   Wt 214 lb (97.1 kg)   SpO2 100%   BMI 49.74 kg/m   Physical Exam  Constitutional: She appears well-developed and well-nourished.  HENT:  Head: Normocephalic and atraumatic.  Eyes: Conjunctivae are normal. Pupils are equal, round, and reactive to light.  Neck: Neck supple. No tracheal deviation present. No thyromegaly present.  Cardiovascular: Normal rate and regular rhythm.   No murmur heard. Pulmonary/Chest: Effort normal and breath sounds normal.  Abdominal: Soft. Bowel sounds are normal. She exhibits no distension. There is no tenderness.  Morbidly obese  Musculoskeletal: Normal range of motion. She exhibits edema. She exhibits no tenderness.  2+ pretibial pitting edema bilaterally.  Neurological: She is alert. Coordination  normal.  Skin: Skin is warm and dry. No rash noted.  Lower extremities edematous. No rash. Legs are not red or hot to the touch. Neurovascularly intact  Psychiatric: She has a normal mood and affect.  Nursing note and vitals reviewed.    ED Treatments / Results  Labs (all labs ordered are listed, but only abnormal results are displayed) Labs Reviewed  BASIC METABOLIC PANEL - Abnormal; Notable for the following:       Result Value   Potassium 3.2 (*)    Glucose, Bld 109 (*)    Calcium 8.8 (*)    All other components within normal limits  CBC - Abnormal; Notable for the following:    Hemoglobin 10.6 (*)    HCT 34.5 (*)    MCH 25.2 (*)    RDW 16.1 (*)    Platelets 509 (*)    All other components within normal limits  D-DIMER, QUANTITATIVE (NOT AT [facility])  I-STAT TROPOININ, ED    EKG  EKG Interpretation  Date/Time:  Friday January 07 2016 16:23:51 EST Ventricular Rate:  115 PR Interval:    QRS Duration: 93 QT Interval:  348 QTC  Calculation: 482 R Axis:   23 Text Interpretation:  Sinus tachycardia Low voltage, precordial leads No significant change since last tracing Confirmed by Winfred Leeds  MD, Avabella Wailes [phone]) on 01/07/2016 4:31:48 PM      Results for orders placed or performed during the hospital encounter of [id number]  Basic metabolic panel  Result Value Ref Range   Sodium 138 135 - 145 mmol/L   Potassium 3.2 (L) 3.5 - 5.1 mmol/L   Chloride 101 101 - 111 mmol/L   CO2 26 22 - 32 mmol/L   Glucose, Bld 109 (H) 65 - 99 mg/dL   BUN 9 6 - 20 mg/dL   Creatinine, Ser 0.72 0.44 - 1.00 mg/dL   Calcium 8.8 (L) 8.9 - 10.3 mg/dL   GFR calc non Af Amer >60 >60 mL/min   GFR calc Af Amer >60 >60 mL/min   Anion gap 11 5 - 15  CBC  Result Value Ref Range   WBC 9.6 4.0 - 10.5 K/uL   RBC 4.21 3.87 - 5.11 MIL/uL   Hemoglobin 10.6 (L) 12.0 - 15.0 g/dL   HCT 34.5 (L) 36.0 - 46.0 %   MCV 81.9 78.0 - 100.0 fL   MCH 25.2 (L) 26.0 - 34.0 pg   MCHC 30.7 30.0 - 36.0 g/dL   RDW 16.1 (H) 11.5 - 15.5 %   Platelets 509 (H) 150 - 400 K/uL  D-dimer, quantitative (not at [facility])  Result Value Ref Range   D-Dimer, Quant 0.77 (H) 0.00 - 0.50 ug/mL-FEU  I-stat troponin, ED  Result Value Ref Range   Troponin i, poc 0.00 0.00 - 0.08 ng/mL   Comment 3           Dg Chest 2 View  Result Date: 01/07/2016 CLINICAL DATA:  Chest pain, intermittent leg swelling over the last month EXAM: CHEST  2 VIEW COMPARISON:  Chest x-ray of 12/08/2015 FINDINGS: No active infiltrate or effusion is seen. Mediastinal and hilar contours are unremarkable. The heart is within normal limits in size. No bony abnormality is seen. IMPRESSION: No active cardiopulmonary disease. Electronically Signed   By: Ivar Drape M.D.   On: 01/07/2016 16:44    Radiology Dg Chest 2 View  Result Date: 01/07/2016 CLINICAL DATA:  Chest pain, intermittent leg swelling over the last month EXAM: CHEST  2 VIEW  COMPARISON:  Chest x-ray of 12/08/2015 FINDINGS: No active infiltrate or  effusion is seen. Mediastinal and hilar contours are unremarkable. The heart is within normal limits in size. No bony abnormality is seen. IMPRESSION: No active cardiopulmonary disease. Electronically Signed   By: Ivar Drape M.D.   On: 01/07/2016 16:44    Procedures Procedures (including critical care time)  Medications Ordered in ED Medications  potassium chloride SA (K-DUR,KLOR-CON) CR tablet 40 mEq (not administered)     Initial Impression / Assessment and Plan / ED Course  I have reviewed the triage vital signs and the nursing notes.  Pertinent labs & imaging results that were available during my care of the patient were reviewed by me and considered in my medical decision making (see chart for details).  Clinical Course    Strongly doubt pulmonary embolism. She denies any shortness of breath.. Mildly elevated d-dimer and bilateral leg edema. She received 100 mg Lovenox prior to discharged outpatient lower extremity Doppler studies arranged for tomorrow to be done here. Anemia is chronic. She received oral potassium here. She's also told to follow-up with primary care physician for Final Clinical Impressions(s) / ED Diagnoses  Diagnoses #1 peripheral edema #2 GERD #3 hypokalemia Final diagnoses:  None  Patient left without waiting for written instructions. I did explain instructions to patient verbally. I've asked nursing to call patient to go over instructions  New Prescriptions New Prescriptions   No medications on file     Orlie Dakin, MD 01/07/16 [id number]    Orlie Dakin, MD 01/07/16 2320

## 2016-01-11 ENCOUNTER — Other Ambulatory Visit: Payer: Self-pay | Admitting: *Deleted

## 2016-01-11 ENCOUNTER — Encounter: Payer: Self-pay | Admitting: Vascular Surgery

## 2016-01-11 ENCOUNTER — Ambulatory Visit (INDEPENDENT_AMBULATORY_CARE_PROVIDER_SITE_OTHER): Payer: BLUE CROSS/BLUE SHIELD | Admitting: Vascular Surgery

## 2016-01-11 ENCOUNTER — Ambulatory Visit (HOSPITAL_COMMUNITY)
Admission: RE | Admit: 2016-01-11 | Discharge: 2016-01-11 | Disposition: A | Discharge status: Home / Self Care | Payer: BLUE CROSS/BLUE SHIELD | Source: Ambulatory Visit | Attending: Vascular Surgery | Admitting: Vascular Surgery

## 2016-01-11 ENCOUNTER — Telehealth: Payer: Self-pay | Admitting: *Deleted

## 2016-01-11 VITALS — BP 117/81 | HR 91 | Temp 98.4°F | Resp 18 | Ht <= 58 in | Wt 200.6 lb

## 2016-01-11 DIAGNOSIS — R600 Localized edema: Secondary | ICD-10-CM

## 2016-01-11 DIAGNOSIS — M7989 Other specified soft tissue disorders: Secondary | ICD-10-CM | POA: Diagnosis not present

## 2016-01-11 DIAGNOSIS — I872 Venous insufficiency (chronic) (peripheral): Secondary | ICD-10-CM | POA: Diagnosis not present

## 2016-01-11 NOTE — Telephone Encounter (Signed)
Following up with Dominique [name] regarding bilateral lower extremity swelling for 1 month.  She is an established patient of Curt Jews MD and is s/p endovenous laser ablation right greater saphenous vein 02-01-2012 and s/p endovenous laser ablation left greater saphenous vein on 02-29-2012.   She was seen at [facility] ER on 01-07-2016 with bilateral lower extremity swelling and was seen by Orlie Dakin, MD with "mildly elevated d-dimer" and was given Lovenox 100 mg prior to discharge and bilateral Venous duplex studies were scheduled at [facility] for 01-08-2016. [name] did not return to [facility] for bilateral venous duplex.  [name] states today her legs are "still swollen but not as swollen as before."  She states she has bilateral leg pain after prolonged standing. Appointment scheduled today for bilateral venous reflux exam (11:00AM) and [facility] follow up appointment with Dr. Donnetta Hutching at 11:30AM.  [name] verbalized understanding and states she is able to make the appointments today.

## 2016-01-11 NOTE — Progress Notes (Signed)
Vascular and Vein Specialist of [facility]  Patient name: Dominique [name] MRN: [id number] DOB: [birth year] Sex: female  REASON FOR VISIT: Evaluation swelling to rule out DVT  HPI: Dominique [name] is a 41 y.o. female well-known to our practice from my prior evaluations of lower from the swelling. She is status post laser ablation of her great saphenous vein in the left and 2015. She is had persistent unusual findings of red discoloration on her lower and upper extremities. Has had swelling in both lower extremities. Has seen rheumatology with no apparent cause for this. She reports that she is having arrangement for referral to [facility] clinic for further evaluation. Last week she had more than her typical swelling in her left leg and presented to the emergency room for further evaluation. A duplex was ordered but she did not present for this. She had been given a single injection of Lovenox prior to discontinuation home. We did receive this information from the patient and recommended that she come in for a duplex since her clinician had voiced concern about possible DVT and felt this needed to be ruled out. She reports that the swelling has improved since last week. Is now down to her typical swelling. She is quite obese and very short and has been had no possibility of wearing compression. She stands. They're of the time and has had no success in trying Ace wrap's as well.  Past Medical History:  Diagnosis Date  . CHF (congestive heart failure) ([place])   . DVT (deep venous thrombosis) ([place])   . GERD (gastroesophageal reflux disease)   . History of recurrent UTIs   . Obesity   . Raynaud disease 12/21/2011  . Varicose veins     Family History  Problem Relation Age of Onset  . Diabetes Mother   . Heart disease Mother   . Hyperlipidemia Mother   . Hypertension Mother   . Other Mother     amputation, varicose veins  . Diabetes Father   . Hyperlipidemia  Father   . Hypertension Father   . Peripheral vascular disease Father   . Other Father     varicose veins  . Deep vein thrombosis Brother   . Other Brother     varicose veins    SOCIAL HISTORY: Social History  Substance Use Topics  . Smoking status: Never Smoker  . Smokeless tobacco: Never Used  . Alcohol use No    Allergies  Allergen Reactions  . Contrast Media [Iodinated Diagnostic Agents] Anaphylaxis, Swelling and Other (See Comments)    Reaction:  Tongue swelling  . Latex Anaphylaxis  . Nsaids Other (See Comments)    Reaction:  GI bleed   . Doxycycline Other (See Comments)    Reaction:  Leg pain  . Eggs Or Egg-Derived Products Nausea And Vomiting  . Lactose Intolerance (Gi) Diarrhea  . Zofran [Ondansetron Hcl] Hives and Other (See Comments)    Reaction:  Numbness of legs     Current Outpatient Prescriptions  Medication Sig Dispense Refill  . b complex vitamins capsule Take 1 capsule by mouth daily. 30 capsule 3  . ferrous sulfate 325 (65 FE) MG tablet Take 325 mg by mouth daily with lunch.    . hydrocortisone 2.5 % ointment Apply 1 application topically 2 (two) times daily as needed (for irritation).    Marland Kitchen omeprazole (PRILOSEC) 40 MG capsule Take 40 mg by mouth 2 (two) times daily.    . valACYclovir (VALTREX) 1000 MG tablet Take 500 mg  by mouth 2 (two) times daily as needed (for outbreaks).    Marland Kitchen aspirin EC 81 MG tablet Take 1 tablet (81 mg total) by mouth daily. (Patient not taking: Reported on 01/11/2016) 90 tablet 3  . tapentadol HCl (NUCYNTA) 75 MG tablet Take 75 mg by mouth 4 (four) times daily as needed for moderate pain.     No current facility-administered medications for this visit.     REVIEW OF SYSTEMS:  [X]  denotes positive finding, [ ]  denotes negative finding Cardiac  Comments:  Chest pain or chest pressure:    Shortness of breath upon exertion:    Short of breath when lying flat:    Irregular heart rhythm:        Vascular    Pain in calf, thigh,  or hip brought on by ambulation:    Pain in feet at night that wakes you up from your sleep:     Blood clot in your veins:    Leg swelling:  x         PHYSICAL EXAM: Vitals:   01/11/16 1235  BP: 117/81  Pulse: 91  Resp: 18  Temp: 98.4 F (36.9 C)  TempSrc: Oral  SpO2: 96%  Weight: 200 lb 9.6 oz (91 kg)  Height: 4\' 7"  (1.397 m)    GENERAL: The patient is a well-nourished female, in no acute distress. The vital signs are documented above. CARDIOVASCULAR: Palpable dorsalis pedis pulses bilaterally PULMONARY: There is good air exchange  MUSCULOSKELETAL: There are no major deformities or cyanosis. NEUROLOGIC: No focal weakness or paresthesias are detected. SKIN: There are no ulcers or rashes noted. PSYCHIATRIC: The patient has a normal affect. Swelling bilaterally. Slightly more on the left than the right. Does not extend onto the dorsum of her foot or toes to suggest lymphedema.  DATA:  Duplex today shows no evidence of DVT. She does have some deep venous reflux.  MEDICAL ISSUES: No DVT as well as raises a concern from the emergency room physician last week. No need for anticoagulation. Still unclear as to the etiology of her unusual symptom complex. Patient is unable to wear thigh-high compressions since they rolled down and isn't been unable to wear knee-high compression because the spines at the level of her popliteal fossa. Have recommended that she consider anti-style impression. I do feel that this may give her some relief related to her chronic swelling. She is reassured with the discussion regarding her duplex will see [place] On an as-needed basis    Rosetta Posner, MD [facility] Vascular and Vein Specialists of [facility] Tel [phone] Pager [phone]

## 2016-01-19 ENCOUNTER — Telehealth: Payer: Self-pay | Admitting: *Deleted

## 2016-01-19 NOTE — Telephone Encounter (Signed)
Returning Ms. Vierling's earlier phone message.  Unable to speak directly with Ms. Valois. Left voice message with Ms. Burleson reviewing her office visit with Dr. Donnetta Hutching on 01-11-2016 and results of venous reflux exam at [facility] on 01-11-2016. Reviewed Dr. Luther Parody recommendations to her from 01-11-2016 office visit.  Asked that she call me at [facility] if she has questions and concerns.

## 2016-02-08 ENCOUNTER — Telehealth: Payer: Self-pay | Admitting: *Deleted

## 2016-02-08 NOTE — Telephone Encounter (Signed)
Ms. Dominique [name] is s/p endovenous laser ablation right greater saphenous vein by Dominique Jews MD 02-01-2012 and endovenous laser ablation left greater saphenous vein on 02-29-2012 and 10-02-2013 both by Dominique Jews MD.  Ms. Dominique [name] was seen by Dominique [name] on 01-11-2016 (for bilateral LE edema and pain )and had bilateral venous duplex at [facility] on 01-11-2016 prior to seeing Dominique [name].  Venous duplex (on 01-11-2016) was negative for DVT in bilateral lower extremities. Dominique [name] recommended elevation of legs when possible and compression hose. Ms. Dominique [name] states she is unable to wear compression hose.  Ms. Dominique [name] states that since last night she has experienced bilateral leg swelling (unable to wear shoes and unable to get out of bathtub on her own) and severe leg pain that prevented her from sleeping. Reviewed Ms. Dominique [name]'s symptoms with Dominique [name] and her clinical records.  Dominique [name] advised that she go to the ER to be assessed for DVT and have bilateral venous duplex done.  Conveyed Dominique [name] recommendation to Ms. Dominique [name] and emphasized the importance of going to ER to have venous duplex performed and to evaluate for DVT.  Ms. Dominique [name] verbalized understanding of instructions.

## 2016-02-09 ENCOUNTER — Emergency Department (HOSPITAL_COMMUNITY)
Admission: EM | Admit: 2016-02-09 | Discharge: 2016-02-09 | Disposition: A | Discharge status: Home / Self Care | Payer: BLUE CROSS/BLUE SHIELD | Attending: Physician Assistant | Admitting: Physician Assistant

## 2016-02-09 ENCOUNTER — Emergency Department (HOSPITAL_BASED_OUTPATIENT_CLINIC_OR_DEPARTMENT_OTHER)
Admit: 2016-02-09 | Discharge: 2016-02-09 | Disposition: A | Discharge status: Home / Self Care | Payer: BLUE CROSS/BLUE SHIELD

## 2016-02-09 ENCOUNTER — Encounter (HOSPITAL_COMMUNITY): Payer: Self-pay | Admitting: Emergency Medicine

## 2016-02-09 ENCOUNTER — Emergency Department (HOSPITAL_COMMUNITY): Payer: BLUE CROSS/BLUE SHIELD

## 2016-02-09 DIAGNOSIS — Z91040 Latex allergy status: Secondary | ICD-10-CM | POA: Insufficient documentation

## 2016-02-09 DIAGNOSIS — M79609 Pain in unspecified limb: Secondary | ICD-10-CM

## 2016-02-09 DIAGNOSIS — M7989 Other specified soft tissue disorders: Secondary | ICD-10-CM

## 2016-02-09 DIAGNOSIS — I509 Heart failure, unspecified: Secondary | ICD-10-CM | POA: Insufficient documentation

## 2016-02-09 DIAGNOSIS — Z79899 Other long term (current) drug therapy: Secondary | ICD-10-CM | POA: Insufficient documentation

## 2016-02-09 DIAGNOSIS — Z7982 Long term (current) use of aspirin: Secondary | ICD-10-CM | POA: Diagnosis not present

## 2016-02-09 LAB — CBC WITH DIFFERENTIAL/PLATELET
BASOS ABS: 0 10*3/uL (ref 0.0–0.1)
BASOS PCT: 0 %
EOS ABS: 0.2 10*3/uL (ref 0.0–0.7)
EOS PCT: 3 %
HCT: 32.3 % — ABNORMAL LOW (ref 36.0–46.0)
HEMOGLOBIN: 9.9 g/dL — AB (ref 12.0–15.0)
LYMPHS ABS: 1.9 10*3/uL (ref 0.7–4.0)
Lymphocytes Relative: 24 %
MCH: 24.9 pg — AB (ref 26.0–34.0)
MCHC: 30.7 g/dL (ref 30.0–36.0)
MCV: 81.4 fL (ref 78.0–100.0)
Monocytes Absolute: 0.5 10*3/uL (ref 0.1–1.0)
Monocytes Relative: 6 %
NEUTROS PCT: 67 %
Neutro Abs: 5.5 10*3/uL (ref 1.7–7.7)
PLATELETS: 469 10*3/uL — AB (ref 150–400)
RBC: 3.97 MIL/uL (ref 3.87–5.11)
RDW: 16.7 % — ABNORMAL HIGH (ref 11.5–15.5)
WBC: 8.1 10*3/uL (ref 4.0–10.5)

## 2016-02-09 LAB — COMPREHENSIVE METABOLIC PANEL
ALBUMIN: 2.9 g/dL — AB (ref 3.5–5.0)
ALK PHOS: 78 U/L (ref 38–126)
ALT: 21 U/L (ref 14–54)
AST: 34 U/L (ref 15–41)
Anion gap: 12 (ref 5–15)
BUN: 7 mg/dL (ref 6–20)
CALCIUM: 9 mg/dL (ref 8.9–10.3)
CHLORIDE: 105 mmol/L (ref 101–111)
CO2: 23 mmol/L (ref 22–32)
CREATININE: 0.78 mg/dL (ref 0.44–1.00)
GFR calc non Af Amer: >60 mL/min (ref >60)
GLUCOSE: 112 mg/dL — AB (ref 65–99)
Potassium: 4 mmol/L (ref 3.5–5.1)
SODIUM: 140 mmol/L (ref 135–145)
Total Bilirubin: 0.2 mg/dL — ABNORMAL LOW (ref 0.3–1.2)
Total Protein: 6.3 g/dL — ABNORMAL LOW (ref 6.5–8.1)

## 2016-02-09 LAB — BRAIN NATRIURETIC PEPTIDE: B Natriuretic Peptide: 268.9 pg/mL — ABNORMAL HIGH (ref 0.0–100.0)

## 2016-02-09 MED ORDER — FENTANYL CITRATE (PF) 100 MCG/2ML IJ SOLN
50.0000 ug | Freq: Once | INTRAMUSCULAR | Status: AC
  Administered 2016-02-09: 50 ug via INTRAVENOUS
  Filled 2016-02-09: qty 2

## 2016-02-09 MED ORDER — OXYCODONE-ACETAMINOPHEN 5-325 MG PO TABS: 2.0000 | ORAL_TABLET | ORAL | 0 refills | Status: DC | PRN

## 2016-02-09 MED ORDER — OXYCODONE-ACETAMINOPHEN 5-325 MG PO TABS
2.0000 | ORAL_TABLET | Freq: Once | ORAL | Status: AC
  Administered 2016-02-09: 2 via ORAL
  Filled 2016-02-09: qty 2

## 2016-02-09 NOTE — Discharge Instructions (Signed)
There does not appear to be an emergent cause for your leg swelling at this time. Your ultrasound was negative for any acute blood clots. You may benefit from wearing compression stockings as recommended by your vascular surgeon. Please follow-up with your vascular surgeon, Dr. Donnetta Hutching for further evaluation and management of your symptoms. Return to ED for any new or worsening symptoms as we discussed including, but not limited to: Fevers, chills, difficulties breathing, chest pain or unusual cough.

## 2016-02-09 NOTE — ED Notes (Signed)
Pt ambulated to restroom from room, tolerated well. 

## 2016-02-09 NOTE — ED Triage Notes (Addendum)
Bilateral leg swelling x 1 week has gained 16 # since Monday plus 4 pitting edema both lower legs

## 2016-02-09 NOTE — Progress Notes (Addendum)
*  PRELIMINARY RESULTS* Vascular Ultrasound Bilateral lower extremity venous duplex has been completed.  Preliminary findings: No evidence of acute deep vein thrombosis in the visualized veins or baker's cysts bilaterally. Chronic changes vs wall thickening in the right popliteal vein.  Difficult exam due to patient body habitus and swelling.    Everrett Coombe 02/09/2016, 6:29 PM

## 2016-02-09 NOTE — ED Notes (Signed)
Actual weight 208.7lb

## 2016-02-09 NOTE — ED Provider Notes (Signed)
[facility] DEPT Provider Note   CSN: [id number] Arrival date & time: 02/09/16  1337     History   Chief Complaint Chief Complaint  Patient presents with  . Leg Pain    HPI Dominique [name] is a 41 y.o. female.  HPI history of CHF, DVT here for evaluation of bilateral leg pain and swelling. She reports symptoms started on Saturday and had been worsening since that time. She reports both legs are swollen and painful. She was advised to come to ED by her pain clinic. She denies any cough, chest pain or overt shortness of breath. Reports intermittent, subjective tactile fevers over the past several days. No other abdominal pain, urinary symptoms, numbness or weakness. She does not currently take diuretics.  Past Medical History:  Diagnosis Date  . CHF (congestive heart failure) ([place])   . DVT (deep venous thrombosis) ([place])   . GERD (gastroesophageal reflux disease)   . History of recurrent UTIs   . Obesity   . Raynaud disease 12/21/2011  . Varicose veins     Patient Active Problem List   Diagnosis Date Noted  . Cellulitis 12/08/2015  . H/O gastroesophageal reflux (GERD) 12/08/2015  . Hypokalemia 12/08/2015  . Thrombocytosis ([place]) 12/08/2015  . Acute upper respiratory infection 12/16/2014  . Bilateral hand pain 11/11/2014  . Dysuria 08/14/2014  . Urinary incontinence 08/14/2014  . Hematuria 08/14/2014  . Right thigh pain 08/14/2014  . UTI (urinary tract infection) 07/29/2014  . Dermatitis 07/02/2014  . Anemia, iron deficiency 03/09/2014  . Personal history of DVT (deep vein thrombosis)   . Skin lesion of breast 01/13/2014  . Fibromyalgia 12/11/2013  . Hallucinations 12/11/2013  . Total body pain 07/18/2013  . Internal hemorrhoid, bleeding 03/27/2013  . Hand swelling 12/27/2012  . 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 Cystitis, chronic 10/15/2012  . Leg swelling 06/18/2012  . Gastritis 04/02/2012  . Obesity (BMI 30-39.9) 02/05/2012  . Varicose veins of lower  extremities with other complications 0000000    Past Surgical History:  Procedure Laterality Date  . ABDOMINAL HYSTERECTOMY    . APPENDECTOMY    . BALLOON DILATION N/A 04/02/2014   Procedure: BALLOON DILATION;  Surgeon: Ronald Lobo, MD;  Location: [facility] ENDOSCOPY;  Service: Endoscopy;  Laterality: N/A;  . CESAREAN SECTION    . COLONOSCOPY WITH PROPOFOL N/A 03/26/2012   Procedure: COLONOSCOPY WITH PROPOFOL;  Surgeon: Cleotis Nipper, MD;  Location: [facility] ENDOSCOPY;  Service: Endoscopy;  Laterality: N/A;  . ENDOVENOUS ABLATION SAPHENOUS VEIN W/ LASER  02-01-2012   right greater saphenous vein and stab phlebectomy 10-20 incisions right leg  by Curt Jews, MD  . ENDOVENOUS ABLATION SAPHENOUS VEIN W/ LASER Left 02-29-2012   left greater saphenous vein and stab phlebectomy left leg 10-20 incisions  by Curt Jews MD  . ENDOVENOUS ABLATION SAPHENOUS VEIN W/ LASER Left   . ESOPHAGOGASTRODUODENOSCOPY (EGD) WITH PROPOFOL N/A 03/26/2012   Procedure: ESOPHAGOGASTRODUODENOSCOPY (EGD) WITH PROPOFOL;  Surgeon: Cleotis Nipper, MD;  Location: [facility];  Service: Endoscopy;  Laterality: N/A;  . ESOPHAGOGASTRODUODENOSCOPY (EGD) WITH PROPOFOL N/A 04/02/2014   Procedure: ESOPHAGOGASTRODUODENOSCOPY (EGD) WITH PROPOFOL;  Surgeon: Ronald Lobo, MD;  Location: [facility] END[facility];  Service: Endoscopy;  Laterality: N/A;  . HERNIA REPAIR  2006    OB History    No data available       Home Medications    Prior to Admission medications   Medication Sig Start Date End Date Taking? Authorizing Provider  amitriptyline (ELAVIL) 50 MG tablet Take 50  mg by mouth at bedtime. 01/13/16  Yes Historical Provider, MD  b complex vitamins capsule Take 1 capsule by mouth daily. 10/10/15  Yes Brunetta Genera, MD  ferrous sulfate 325 (65 FE) MG tablet Take 325 mg by mouth daily with lunch.   Yes Historical Provider, MD  hydrocortisone 2.5 % ointment Apply 1 application topically 2 (two) times daily as needed (for irritation).    Yes Historical Provider, MD  Meloxicam (MOBIC PO) Take 1 tablet by mouth daily as needed. pain   Yes Historical Provider, MD  omeprazole (PRILOSEC) 40 MG capsule Take 40 mg by mouth 2 (two) times daily.   Yes Historical Provider, MD  aspirin EC 81 MG tablet Take 1 tablet (81 mg total) by mouth daily. Patient not taking: Reported on 01/11/2016 09/01/15   Josue Hector, MD  tapentadol HCl (NUCYNTA) 75 MG tablet Take 75 mg by mouth 4 (four) times daily as needed for moderate pain.    Historical Provider, MD  valACYclovir (VALTREX) 1000 MG tablet Take 500 mg by mouth 2 (two) times daily as needed (for outbreaks).    Historical Provider, MD    Family History Family History  Problem Relation Age of Onset  . Diabetes Mother   . Heart disease Mother   . Hyperlipidemia Mother   . Hypertension Mother   . Other Mother     amputation, varicose veins  . Diabetes Father   . Hyperlipidemia Father   . Hypertension Father   . Peripheral vascular disease Father   . Other Father     varicose veins  . Deep vein thrombosis Brother   . Other Brother     varicose veins    Social History Social History  Substance Use Topics  . Smoking status: Never Smoker  . Smokeless tobacco: Never Used  . Alcohol use No     Allergies   Contrast media [iodinated diagnostic agents]; Latex; Nsaids; Doxycycline; Eggs or egg-derived products; Lactose intolerance (gi); and Zofran [ondansetron hcl]   Review of Systems Review of Systems A 10 point review of systems was completed and was negative except for pertinent positives and negatives as mentioned in the history of present illness    Physical Exam Updated Vital Signs BP 132/78   Pulse 91   Temp 99.3 F (37.4 C) (Oral)   Resp 23   Ht 4\' 7"  (1.397 m)   Wt 94.7 kg   SpO2 95%   BMI 48.51 kg/m   Physical Exam  Constitutional: She is oriented to person, place, and time. She appears well-developed. No distress.  Awake, alert and nontoxic in appearance    HENT:  Head: Normocephalic and atraumatic.  Right Ear: External ear normal.  Left Ear: External ear normal.  Mouth/Throat: Oropharynx is clear and moist.  Eyes: Conjunctivae and EOM are normal. Pupils are equal, round, and reactive to light.  Neck: Normal range of motion. No JVD present.  Cardiovascular: Normal rate, regular rhythm and normal heart sounds.   Pulmonary/Chest: Effort normal and breath sounds normal. No stridor.  Abdominal: Soft. There is no tenderness.  Musculoskeletal: Normal range of motion.  Bilateral lower legs are swollen, mild erythema and warmth to bilateral distal tibias-worse on the left. Tender to touch. Distal pulses intact. Full range of motion  Neurological: She is alert and oriented to person, place, and time. Coordination normal.  Awake, alert, cooperative and aware of situation; motor strength bilaterally; sensation normal to light touch bilaterally; no facial asymmetry; tongue midline; major cranial  nerves appear intact;  baseline gait without new ataxia.  Skin: No rash noted. She is not diaphoretic.  Psychiatric: She has a normal mood and affect. Her behavior is normal. Thought content normal.  Nursing note and vitals reviewed.    ED Treatments / Results  Labs (all labs ordered are listed, but only abnormal results are displayed) Labs Reviewed  CBC WITH DIFFERENTIAL/PLATELET - Abnormal; Notable for the following:       Result Value   Hemoglobin 9.9 (*)    HCT 32.3 (*)    MCH 24.9 (*)    RDW 16.7 (*)    Platelets 469 (*)    All other components within normal limits  COMPREHENSIVE METABOLIC PANEL - Abnormal; Notable for the following:    Glucose, Bld 112 (*)    Total Protein 6.3 (*)    Albumin 2.9 (*)    Total Bilirubin 0.2 (*)    All other components within normal limits  BRAIN NATRIURETIC PEPTIDE - Abnormal; Notable for the following:    B Natriuretic Peptide 268.9 (*)    All other components within normal limits    EKG  EKG  Interpretation None       Radiology Dg Chest 2 View  Result Date: 02/09/2016 CLINICAL DATA:  Swelling of the legs, left-sided chest pain for several days, some shortness of breath at night EXAM: CHEST  2 VIEW COMPARISON:  Chest x-ray of 01/07/2016 FINDINGS: No active infiltrate or effusion is seen. Mediastinal and hilar contours are unremarkable. The heart is within normal limits in size. No bony abnormality is seen. IMPRESSION: No active cardiopulmonary disease. Electronically Signed   By: Ivar Drape M.D.   On: 02/09/2016 16:36    Procedures Procedures (including critical care time)  Medications Ordered in ED Medications  fentaNYL (SUBLIMAZE) injection 50 mcg (50 mcg Intravenous Given 02/09/16 1801)  oxyCODONE-acetaminophen (PERCOCET/ROXICET) 5-325 MG per tablet 2 tablet (2 tablets Oral Given 02/09/16 1925)     Initial Impression / Assessment and Plan / ED Course  I have reviewed the triage vital signs and the nursing notes.  Pertinent labs & imaging results that were available during my care of the patient were reviewed by me and considered in my medical decision making (see chart for details).     Patient with bilateral lower leg swelling that seems to be a chronic issue based on review of old notes. She had Doppler of the extremities completed earlier this month that were negative, however given history of DVT and clinical presentation will obtain bilateral Lower extremity ultrasound. If negative, patient may follow-up with her cardiologist and vein specialist for further evaluation. BNP of 269, maintain a low suspicion for CHF exacerbation. She has benign pulmonary exam, no cardio pulmonary complaints, negative chest x-ray. Normal echo August 2017. Doubt cellulitis. Patient overall appears well, nontoxic here today with a stable, afebrile and appropriate for outpatient follow-up.  Prior to patient discharge, I discussed and reviewed this case with Dr. Thomasene Lot     Final Clinical  Impressions(s) / ED Diagnoses   Final diagnoses:  Leg swelling    New Prescriptions Current Discharge Medication List       Comer Locket, PA-C 02/09/16 2032    [place], MD 02/21/16 [phone]

## 2016-02-23 ENCOUNTER — Other Ambulatory Visit: Payer: Self-pay | Admitting: Internal Medicine

## 2016-02-23 DIAGNOSIS — Z1231 Encounter for screening mammogram for malignant neoplasm of breast: Secondary | ICD-10-CM

## 2016-03-08 ENCOUNTER — Ambulatory Visit: Payer: BLUE CROSS/BLUE SHIELD

## 2016-03-28 ENCOUNTER — Ambulatory Visit: Payer: BLUE CROSS/BLUE SHIELD

## 2016-04-16 ENCOUNTER — Encounter (HOSPITAL_COMMUNITY): Payer: Self-pay | Admitting: Emergency Medicine

## 2016-04-16 ENCOUNTER — Emergency Department (HOSPITAL_COMMUNITY): Payer: BLUE CROSS/BLUE SHIELD

## 2016-04-16 ENCOUNTER — Emergency Department (HOSPITAL_COMMUNITY)
Admission: EM | Admit: 2016-04-16 | Discharge: 2016-04-16 | Disposition: A | Discharge status: Home / Self Care | Payer: BLUE CROSS/BLUE SHIELD | Attending: Emergency Medicine | Admitting: Emergency Medicine

## 2016-04-16 DIAGNOSIS — R0789 Other chest pain: Secondary | ICD-10-CM | POA: Diagnosis present

## 2016-04-16 DIAGNOSIS — I509 Heart failure, unspecified: Secondary | ICD-10-CM | POA: Diagnosis not present

## 2016-04-16 DIAGNOSIS — E876 Hypokalemia: Secondary | ICD-10-CM | POA: Diagnosis not present

## 2016-04-16 DIAGNOSIS — R079 Chest pain, unspecified: Secondary | ICD-10-CM

## 2016-04-16 DIAGNOSIS — K219 Gastro-esophageal reflux disease without esophagitis: Secondary | ICD-10-CM | POA: Diagnosis not present

## 2016-04-16 DIAGNOSIS — Z7982 Long term (current) use of aspirin: Secondary | ICD-10-CM | POA: Diagnosis not present

## 2016-04-16 DIAGNOSIS — D500 Iron deficiency anemia secondary to blood loss (chronic): Secondary | ICD-10-CM | POA: Diagnosis not present

## 2016-04-16 DIAGNOSIS — Z91040 Latex allergy status: Secondary | ICD-10-CM | POA: Diagnosis not present

## 2016-04-16 LAB — COMPREHENSIVE METABOLIC PANEL
ALK PHOS: 100 U/L (ref 38–126)
ALT: 21 U/L (ref 14–54)
ANION GAP: 11 (ref 5–15)
AST: 35 U/L (ref 15–41)
Albumin: 2.7 g/dL — ABNORMAL LOW (ref 3.5–5.0)
CO2: 27 mmol/L (ref 22–32)
Calcium: 8.4 mg/dL — ABNORMAL LOW (ref 8.9–10.3)
Chloride: 102 mmol/L (ref 101–111)
Creatinine, Ser: 0.72 mg/dL (ref 0.44–1.00)
GFR calc non Af Amer: >60 mL/min (ref >60)
GLUCOSE: 106 mg/dL — AB (ref 65–99)
Potassium: 3.3 mmol/L — ABNORMAL LOW (ref 3.5–5.1)
Sodium: 140 mmol/L (ref 135–145)
Total Protein: 6.1 g/dL — ABNORMAL LOW (ref 6.5–8.1)

## 2016-04-16 LAB — I-STAT TROPONIN, ED: Troponin i, poc: 0 ng/mL (ref 0.00–0.08)

## 2016-04-16 LAB — LIPASE, BLOOD: Lipase: 18 U/L (ref 11–51)

## 2016-04-16 LAB — CBC
HEMATOCRIT: 31.2 % — AB (ref 36.0–46.0)
Hemoglobin: 9.6 g/dL — ABNORMAL LOW (ref 12.0–15.0)
MCH: 24.1 pg — AB (ref 26.0–34.0)
MCHC: 30.8 g/dL (ref 30.0–36.0)
MCV: 78.4 fL (ref 78.0–100.0)
Platelets: 424 10*3/uL — ABNORMAL HIGH (ref 150–400)
RBC: 3.98 MIL/uL (ref 3.87–5.11)
RDW: 17.6 % — ABNORMAL HIGH (ref 11.5–15.5)
WBC: 7.8 10*3/uL (ref 4.0–10.5)

## 2016-04-16 MED ORDER — POTASSIUM CHLORIDE CRYS ER 20 MEQ PO TBCR: 20.0000 meq | EXTENDED_RELEASE_TABLET | Freq: Two times a day (BID) | ORAL | 0 refills | Status: DC

## 2016-04-16 MED ORDER — GI COCKTAIL ~~LOC~~
30.0000 mL | Freq: Once | ORAL | Status: AC
  Administered 2016-04-16: 30 mL via ORAL
  Filled 2016-04-16: qty 30

## 2016-04-16 MED ORDER — POTASSIUM CHLORIDE CRYS ER 20 MEQ PO TBCR
40.0000 meq | EXTENDED_RELEASE_TABLET | Freq: Once | ORAL | Status: AC
Start: 2016-04-16 — End: 2016-04-16
  Administered 2016-04-16: 40 meq via ORAL
  Filled 2016-04-16: qty 2

## 2016-04-16 NOTE — ED Notes (Signed)
Pt returns from radiology placed back on tele. 

## 2016-04-16 NOTE — ED Triage Notes (Signed)
Per ems, pt c/o gerd, ate last night laid down and had reflux, hx of esophageal stricter, also dx with heart failure. C/o swelling and edema x1 week. Pt supposed to be on lasix but is not taking any. Pt denies pain, just states shes swelling. C/o CP last night at work.

## 2016-04-16 NOTE — ED Provider Notes (Signed)
[facility] DEPT Provider Note   CSN: [id number] Arrival date & time: 04/16/16  5638     History   Chief Complaint Chief Complaint  Patient presents with  . Leg Swelling  . Chest Pain    HPI Dominique [name] is a 41 y.o. female.  HPI  41 year old female history of DVT, GERD, morbid obesity, CHF presents today complaining of chest discomfort here her momentarily 2 times last night and was present when she woke from sleep. She states that she has some potato chips prior to going to bed at 3 AM. This morning she woke up with some burning and fluid in the back of her throat. She was somewhat nauseated. This pain has resolved since then. It is similar to her previous reflux discomfort. She had some associated nausea but did not vomit. She denies any lateralized swelling in her legs but feels that she has had some fluid retention in her hands and feet bilaterally. She is not complaining of dyspnea, fever, chills, vision change, headache, vomiting, or diarrhea.  Past Medical History:  Diagnosis Date  . CHF (congestive heart failure) ([place])   . DVT (deep venous thrombosis) ([place])   . GERD (gastroesophageal reflux disease)   . History of recurrent UTIs   . Obesity   . Raynaud disease 12/21/2011  . Varicose veins     Patient Active Problem List   Diagnosis Date Noted  . Cellulitis 12/08/2015  . H/O gastroesophageal reflux (GERD) 12/08/2015  . Hypokalemia 12/08/2015  . Thrombocytosis ([place]) 12/08/2015  . Acute upper respiratory infection 12/16/2014  . Bilateral hand pain 11/11/2014  . Dysuria 08/14/2014  . Urinary incontinence 08/14/2014  . Hematuria 08/14/2014  . Right thigh pain 08/14/2014  . UTI (urinary tract infection) 07/29/2014  . Dermatitis 07/02/2014  . Anemia, iron deficiency 03/09/2014  . Personal history of DVT (deep vein thrombosis)   . Skin lesion of breast 01/13/2014  . Fibromyalgia 12/11/2013  . Hallucinations 12/11/2013  . Total body pain 07/18/2013  .  Internal hemorrhoid, bleeding 03/27/2013  . Hand swelling 12/27/2012  . 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 Cystitis, chronic 10/15/2012  . Leg swelling 06/18/2012  . Gastritis 04/02/2012  . Obesity (BMI 30-39.9) 02/05/2012  . Varicose veins of lower extremities with other complications 75/64/3329    Past Surgical History:  Procedure Laterality Date  . ABDOMINAL HYSTERECTOMY    . APPENDECTOMY    . BALLOON DILATION N/A 04/02/2014   Procedure: BALLOON DILATION;  Surgeon: Ronald Lobo, MD;  Location: [facility];  Service: Endoscopy;  Laterality: N/A;  . CESAREAN SECTION    . COLONOSCOPY WITH PROPOFOL N/A 03/26/2012   Procedure: COLONOSCOPY WITH PROPOFOL;  Surgeon: Cleotis Nipper, MD;  Location: [facility];  Service: Endoscopy;  Laterality: N/A;  . ENDOVENOUS ABLATION SAPHENOUS VEIN W/ LASER  02-01-2012   right greater saphenous vein and stab phlebectomy 10-20 incisions right leg  by Curt Jews, MD  . ENDOVENOUS ABLATION SAPHENOUS VEIN W/ LASER Left 02-29-2012   left greater saphenous vein and stab phlebectomy left leg 10-20 incisions  by Curt Jews MD  . ENDOVENOUS ABLATION SAPHENOUS VEIN W/ LASER Left   . ESOPHAGOGASTRODUODENOSCOPY (EGD) WITH PROPOFOL N/A 03/26/2012   Procedure: ESOPHAGOGASTRODUODENOSCOPY (EGD) WITH PROPOFOL;  Surgeon: Cleotis Nipper, MD;  Location: [facility];  Service: Endoscopy;  Laterality: N/A;  . ESOPHAGOGASTRODUODENOSCOPY (EGD) WITH PROPOFOL N/A 04/02/2014   Procedure: ESOPHAGOGASTRODUODENOSCOPY (EGD) WITH PROPOFOL;  Surgeon: Ronald Lobo, MD;  Location: [facility];  Service: Endoscopy;  Laterality: N/A;  .  HERNIA REPAIR  2006    OB History    No data available       Home Medications    Prior to Admission medications   Medication Sig Start Date End Date Taking? Authorizing Provider  amitriptyline (ELAVIL) 50 MG tablet Take 50 mg by mouth at bedtime. 01/13/16   Historical Provider, MD  aspirin EC 81 MG tablet Take 1 tablet (81 mg total) by  mouth daily. Patient not taking: Reported on 01/11/2016 09/01/15   Josue Hector, MD  b complex vitamins capsule Take 1 capsule by mouth daily. 10/10/15   Brunetta Genera, MD  ferrous sulfate 325 (65 FE) MG tablet Take 325 mg by mouth daily with lunch.    Historical Provider, MD  hydrocortisone 2.5 % ointment Apply 1 application topically 2 (two) times daily as needed (for irritation).    Historical Provider, MD  Meloxicam (MOBIC PO) Take 1 tablet by mouth daily as needed. pain    Historical Provider, MD  omeprazole (PRILOSEC) 40 MG capsule Take 40 mg by mouth 2 (two) times daily.    Historical Provider, MD  tapentadol HCl (NUCYNTA) 75 MG tablet Take 75 mg by mouth 4 (four) times daily as needed for moderate pain.    Historical Provider, MD  valACYclovir (VALTREX) 1000 MG tablet Take 500 mg by mouth 2 (two) times daily as needed (for outbreaks).    Historical Provider, MD    Family History Family History  Problem Relation Age of Onset  . Diabetes Mother   . Heart disease Mother   . Hyperlipidemia Mother   . Hypertension Mother   . Other Mother     amputation, varicose veins  . Diabetes Father   . Hyperlipidemia Father   . Hypertension Father   . Peripheral vascular disease Father   . Other Father     varicose veins  . Deep vein thrombosis Brother   . Other Brother     varicose veins    Social History Social History  Substance Use Topics  . Smoking status: Never Smoker  . Smokeless tobacco: Never Used  . Alcohol use No     Allergies   Contrast media [iodinated diagnostic agents]; Latex; Nsaids; Doxycycline; Eggs or egg-derived products; Lactose intolerance (gi); and Zofran [ondansetron hcl]   Review of Systems Review of Systems  Constitutional: Negative.   HENT: Negative.   Eyes: Negative.   Respiratory: Positive for chest tightness.   Cardiovascular: Negative.   Gastrointestinal: Positive for nausea. Negative for diarrhea and vomiting.  Endocrine: Negative.     Genitourinary: Negative.   Musculoskeletal: Negative.   Neurological: Negative.   Hematological: Negative.   Psychiatric/Behavioral: Negative.   All other systems reviewed and are negative.    Physical Exam Updated Vital Signs BP 109/76   Pulse 80   Temp 98.2 F (36.8 C) (Oral)   Resp 13   SpO2 100%   Physical Exam  Constitutional: She appears well-developed and well-nourished.  HENT:  Head: Normocephalic and atraumatic.  Right Ear: External ear normal.  Left Ear: External ear normal.  Nose: Nose normal.  Mouth/Throat: Oropharynx is clear and moist.  Eyes: EOM are normal. Pupils are equal, round, and reactive to light.  Neck: Neck supple.  Cardiovascular: Normal rate, regular rhythm, normal heart sounds and intact distal pulses.   Pulmonary/Chest: Effort normal and breath sounds normal.  Abdominal: Soft.  Musculoskeletal: Normal range of motion.  Neurological: She is alert.  Skin: Skin is warm. Capillary refill takes less  than 2 seconds.  Psychiatric: She has a normal mood and affect.  Nursing note and vitals reviewed.    ED Treatments / Results  Labs (all labs ordered are listed, but only abnormal results are displayed) Labs Reviewed  CBC - Abnormal; Notable for the following:       Result Value   Hemoglobin 9.6 (*)    HCT 31.2 (*)    MCH 24.1 (*)    RDW 17.6 (*)    Platelets 424 (*)    All other components within normal limits  COMPREHENSIVE METABOLIC PANEL - Abnormal; Notable for the following:    Potassium 3.3 (*)    Glucose, Bld 106 (*)    BUN <5 (*)    Calcium 8.4 (*)    Total Protein 6.1 (*)    Albumin 2.7 (*)    Total Bilirubin <0.1 (*)    All other components within normal limits  LIPASE, BLOOD  I-STAT TROPOININ, ED    EKG  EKG Interpretation  Date/Time:  Sunday April 16 2016 08:26:06 EDT Ventricular Rate:  93 PR Interval:    QRS Duration: 102 QT Interval:  376 QTC Calculation: 468 R Axis:   0 Text Interpretation:  Sinus rhythm Low  voltage, precordial leads Borderline T abnormalities, anterior leads Confirmed by Keevan Wolz MD, Andee Poles [phone]) on 04/16/2016 8:35:45 AM       Radiology Dg Chest 2 View  Result Date: 04/16/2016 CLINICAL DATA:  Chest pain.  Cough. EXAM: CHEST  2 VIEW COMPARISON:  February 09, 2016 FINDINGS: No pneumothorax. Mild left basilar atelectasis. No pulmonary nodules, masses, or focal infiltrates, or edema. The cardiomediastinal silhouette is stable. IMPRESSION: No active cardiopulmonary disease. Electronically Signed   By: Dorise Bullion III M.D   On: 04/16/2016 09:12    Procedures Procedures (including critical care time)  Medications Ordered in ED Medications  gi cocktail (Maalox,Lidocaine,Donnatal) (30 mLs Oral Given 04/16/16 0843)     Initial Impression / Assessment and Plan / ED Course  I have reviewed the triage vital signs and the nursing notes.  Pertinent labs & imaging results that were available during my care of the patient were reviewed by me and considered in my medical decision making (see chart for details).    1-chest pain- given history most c.w. gerd.  EKG and troponin and cxr without acute changes- doubt acs or pe-  Patient describes reflux with gastric acid taste in back of mouth. 2-chronic iron deficiency anemia- stable 3- mild hypokalemia- oral repletion 4-reflux-we have discussed conservative management therapy including abstaining from food or drink for several hours prior to bedtime and elevation of head of bed.  Patient advised regarding return precautions and need for follow-up voices understanding.  Final Clinical Impressions(s) / ED Diagnoses   Final diagnoses:  Gastroesophageal reflux disease, esophagitis presence not specified  Chest pain, unspecified type  Hypokalemia    New Prescriptions New Prescriptions   POTASSIUM CHLORIDE SA (K-DUR,KLOR-CON) 20 MEQ TABLET    Take 1 tablet (20 mEq total) by mouth 2 (two) times daily.     Pattricia Boss, MD 04/16/16  1001

## 2016-04-16 NOTE — ED Notes (Signed)
Lab at bedside

## 2016-04-16 NOTE — ED Notes (Signed)
ED Provider at bedside. 

## 2016-04-16 NOTE — ED Notes (Signed)
Pt is in stable condition upon d/c and ambulates from ED. 

## 2016-04-20 ENCOUNTER — Telehealth: Payer: Self-pay | Admitting: *Deleted

## 2016-04-20 NOTE — Telephone Encounter (Signed)
Pt called in saying she is having swelling in her legs again like earlier this year. She is wondering if she needs to be seen. I called and left a voicemail since she didn't answer. She has bilat deep reflux and has had bilat lasers. Told her elevation was what she should try. In Jan she did not have DVT's. Asked her to call back if she has pain and if one leg is worse than the other. Will follow prn.

## 2016-08-31 ENCOUNTER — Other Ambulatory Visit: Payer: Self-pay | Admitting: Dermatology

## 2016-10-07 ENCOUNTER — Encounter (HOSPITAL_COMMUNITY): Payer: Self-pay | Admitting: Emergency Medicine

## 2016-10-07 ENCOUNTER — Emergency Department (HOSPITAL_COMMUNITY)
Admission: EM | Admit: 2016-10-07 | Discharge: 2016-10-07 | Disposition: A | Discharge status: Home / Self Care | Payer: BLUE CROSS/BLUE SHIELD | Attending: Emergency Medicine | Admitting: Emergency Medicine

## 2016-10-07 DIAGNOSIS — K0889 Other specified disorders of teeth and supporting structures: Secondary | ICD-10-CM | POA: Diagnosis present

## 2016-10-07 DIAGNOSIS — Z5321 Procedure and treatment not carried out due to patient leaving prior to being seen by health care provider: Secondary | ICD-10-CM | POA: Diagnosis not present

## 2016-10-07 NOTE — ED Notes (Signed)
PT info this writer she does not want me to update vitals. Due to wait time PT info writer she heading home to lay down

## 2016-10-07 NOTE — ED Triage Notes (Signed)
Pt has multiple complaints  Pt states she has a toothache on both sides but the top left side is worse  Pt states she is unable to eat or drink because of the pain  Pt states her iron is low  Pt states also that she has had a staff infection for over a year and has taken 5 rounds of antibiotics for   Pt states her legs are painful and it hurts to walk  Pt states she was seen at [facility] to have her legs checked  Pt states she has lost her job because of her legs and not being able to work as much and she has open sores on her face so she was fired  Pt states that has caused her to be homeless

## 2016-10-08 ENCOUNTER — Emergency Department (HOSPITAL_COMMUNITY): Payer: BLUE CROSS/BLUE SHIELD

## 2016-10-08 ENCOUNTER — Encounter (HOSPITAL_COMMUNITY): Payer: Self-pay | Admitting: Emergency Medicine

## 2016-10-08 ENCOUNTER — Emergency Department (HOSPITAL_COMMUNITY)
Admission: EM | Admit: 2016-10-08 | Discharge: 2016-10-08 | Disposition: A | Discharge status: Home / Self Care | Payer: BLUE CROSS/BLUE SHIELD | Attending: Emergency Medicine | Admitting: Emergency Medicine

## 2016-10-08 DIAGNOSIS — R51 Headache: Secondary | ICD-10-CM | POA: Diagnosis not present

## 2016-10-08 DIAGNOSIS — I509 Heart failure, unspecified: Secondary | ICD-10-CM | POA: Insufficient documentation

## 2016-10-08 DIAGNOSIS — Z79899 Other long term (current) drug therapy: Secondary | ICD-10-CM | POA: Insufficient documentation

## 2016-10-08 DIAGNOSIS — J40 Bronchitis, not specified as acute or chronic: Secondary | ICD-10-CM

## 2016-10-08 DIAGNOSIS — K0889 Other specified disorders of teeth and supporting structures: Secondary | ICD-10-CM | POA: Insufficient documentation

## 2016-10-08 DIAGNOSIS — R05 Cough: Secondary | ICD-10-CM | POA: Diagnosis present

## 2016-10-08 DIAGNOSIS — Z91040 Latex allergy status: Secondary | ICD-10-CM | POA: Insufficient documentation

## 2016-10-08 LAB — BASIC METABOLIC PANEL
Anion gap: 12 (ref 5–15)
BUN: <5 mg/dL — ABNORMAL LOW (ref 6–20)
CO2: 27 mmol/L (ref 22–32)
Calcium: 8.8 mg/dL — ABNORMAL LOW (ref 8.9–10.3)
Chloride: 100 mmol/L — ABNORMAL LOW (ref 101–111)
Creatinine, Ser: 0.86 mg/dL (ref 0.44–1.00)
GFR calc Af Amer: >60 mL/min (ref >60)
Glucose, Bld: 105 mg/dL — ABNORMAL HIGH (ref 65–99)
POTASSIUM: 3.6 mmol/L (ref 3.5–5.1)
SODIUM: 139 mmol/L (ref 135–145)

## 2016-10-08 LAB — CBC
HEMATOCRIT: 35 % — AB (ref 36.0–46.0)
HEMOGLOBIN: 10.6 g/dL — AB (ref 12.0–15.0)
MCH: 24 pg — ABNORMAL LOW (ref 26.0–34.0)
MCHC: 30.3 g/dL (ref 30.0–36.0)
MCV: 79.4 fL (ref 78.0–100.0)
Platelets: 511 10*3/uL — ABNORMAL HIGH (ref 150–400)
RBC: 4.41 MIL/uL (ref 3.87–5.11)
RDW: 16.6 % — ABNORMAL HIGH (ref 11.5–15.5)
WBC: 10.7 10*3/uL — AB (ref 4.0–10.5)

## 2016-10-08 LAB — I-STAT TROPONIN, ED: Troponin i, poc: 0 ng/mL (ref 0.00–0.08)

## 2016-10-08 MED ORDER — AMOXICILLIN-POT CLAVULANATE 875-125 MG PO TABS: 1.0000 | ORAL_TABLET | Freq: Two times a day (BID) | ORAL | 0 refills | Status: DC

## 2016-10-08 NOTE — ED Triage Notes (Signed)
Brought by ems from home for c/o central chest pain.  Reports the pain is dull with a burning.  Reports pain started tonight but has had weakness for days.

## 2016-10-08 NOTE — ED Provider Notes (Signed)
[facility] DEPT Provider Note   CSN: [id number] Arrival date & time: 10/08/16  5784     History   Chief Complaint Chief Complaint  Patient presents with  . Chest Pain    HPI Dominique [name] is a 41 y.o. female.  HPI Patient is a 41 year old female with multiple medical problems who presents emergency department with ongoing productive cough and discomfort in her left chest with coughing.  She reports a burning sensation in her chest as well.  No significant shortness of breath.  No history of asthma or COPD.  She also reports left-sided facial pain and tooth pain.  She states she is on an antibiotic but does not remove the name.  She does not have the finances for a dentist right now.  No fevers or chills.  No abdominal pain.  No back pain.  No discomfort in her arms or legs.  No recent injury or trauma.   Past Medical History:  Diagnosis Date  . CHF (congestive heart failure) ([place])   . DVT (deep venous thrombosis) ([place])   . GERD (gastroesophageal reflux disease)   . History of recurrent UTIs   . Obesity   . Raynaud disease 12/21/2011  . Varicose veins     Patient Active Problem List   Diagnosis Date Noted  . Cellulitis 12/08/2015  . H/O gastroesophageal reflux (GERD) 12/08/2015  . Hypokalemia 12/08/2015  . Thrombocytosis ([place]) 12/08/2015  . Acute upper respiratory infection 12/16/2014  . Bilateral hand pain 11/11/2014  . Dysuria 08/14/2014  . Urinary incontinence 08/14/2014  . Hematuria 08/14/2014  . Right thigh pain 08/14/2014  . UTI (urinary tract infection) 07/29/2014  . Dermatitis 07/02/2014  . Anemia, iron deficiency 03/09/2014  . Personal history of DVT (deep vein thrombosis)   . Skin lesion of breast 01/13/2014  . Fibromyalgia 12/11/2013  . Hallucinations 12/11/2013  . Total body pain 07/18/2013  . Internal hemorrhoid, bleeding 03/27/2013  . Hand swelling 12/27/2012  . 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 Cystitis, chronic 10/15/2012  . Leg  swelling 06/18/2012  . Gastritis 04/02/2012  . Obesity (BMI 30-39.9) 02/05/2012  . Varicose veins of lower extremities with other complications 69/62/9528    Past Surgical History:  Procedure Laterality Date  . ABDOMINAL HYSTERECTOMY    . APPENDECTOMY    . BALLOON DILATION N/A 04/02/2014   Procedure: BALLOON DILATION;  Surgeon: Ronald Lobo, MD;  Location: [facility];  Service: Endoscopy;  Laterality: N/A;  . CESAREAN SECTION    . COLONOSCOPY WITH PROPOFOL N/A 03/26/2012   Procedure: COLONOSCOPY WITH PROPOFOL;  Surgeon: Cleotis Nipper, MD;  Location: [facility];  Service: Endoscopy;  Laterality: N/A;  . ENDOVENOUS ABLATION SAPHENOUS VEIN W/ LASER  02-01-2012   right greater saphenous vein and stab phlebectomy 10-20 incisions right leg  by Curt Jews, MD  . ENDOVENOUS ABLATION SAPHENOUS VEIN W/ LASER Left 02-29-2012   left greater saphenous vein and stab phlebectomy left leg 10-20 incisions  by Curt Jews MD  . ENDOVENOUS ABLATION SAPHENOUS VEIN W/ LASER Left   . ESOPHAGOGASTRODUODENOSCOPY (EGD) WITH PROPOFOL N/A 03/26/2012   Procedure: ESOPHAGOGASTRODUODENOSCOPY (EGD) WITH PROPOFOL;  Surgeon: Cleotis Nipper, MD;  Location: [facility];  Service: Endoscopy;  Laterality: N/A;  . ESOPHAGOGASTRODUODENOSCOPY (EGD) WITH PROPOFOL N/A 04/02/2014   Procedure: ESOPHAGOGASTRODUODENOSCOPY (EGD) WITH PROPOFOL;  Surgeon: Ronald Lobo, MD;  Location: [facility];  Service: Endoscopy;  Laterality: N/A;  . HERNIA REPAIR  2006    OB History    No data available  Home Medications    Prior to Admission medications   Medication Sig Start Date End Date Taking? Authorizing Provider  amitriptyline (ELAVIL) 50 MG tablet Take 50 mg by mouth at bedtime. 01/13/16   [provider]  amoxicillin-clavulanate (AUGMENTIN) 875-125 MG tablet Take 1 tablet by mouth every 12 (twelve) hours. 10/08/16   Jola Schmidt, MD  aspirin EC 81 MG tablet Take 1 tablet (81 mg total) by mouth  daily. Patient not taking: Reported on 01/11/2016 09/01/15   Josue Hector, MD  b complex vitamins capsule Take 1 capsule by mouth daily. 10/10/15   Brunetta Genera, MD  ferrous sulfate 325 (65 FE) MG tablet Take 325 mg by mouth daily with lunch.    [provider]  hydrocortisone 2.5 % ointment Apply 1 application topically 2 (two) times daily as needed (for irritation).    [provider]  Meloxicam (MOBIC PO) Take 1 tablet by mouth daily as needed. pain    [provider]  omeprazole (PRILOSEC) 40 MG capsule Take 40 mg by mouth 2 (two) times daily.    [provider]  potassium chloride SA (K-DUR,KLOR-CON) 20 MEQ tablet Take 1 tablet (20 mEq total) by mouth 2 (two) times daily. 04/16/16   Pattricia Boss, MD  tapentadol HCl (NUCYNTA) 75 MG tablet Take 75 mg by mouth 4 (four) times daily as needed for moderate pain.    [provider]  valACYclovir (VALTREX) 1000 MG tablet Take 500 mg by mouth 2 (two) times daily as needed (for outbreaks).    [provider]    Family History Family History  Problem Relation Age of Onset  . Diabetes Mother   . Heart disease Mother   . Hyperlipidemia Mother   . Hypertension Mother   . Other Mother        amputation, varicose veins  . Diabetes Father   . Hyperlipidemia Father   . Hypertension Father   . Peripheral vascular disease Father   . Other Father        varicose veins  . Deep vein thrombosis Brother   . Other Brother        varicose veins    Social History Social History  Substance Use Topics  . Smoking status: Never Smoker  . Smokeless tobacco: Never Used  . Alcohol use No     Allergies   Contrast media [iodinated diagnostic agents]; Latex; Nsaids; Doxycycline; Eggs or egg-derived products; Lactose intolerance (gi); and Zofran [ondansetron hcl]   Review of Systems Review of Systems  All other systems reviewed and are negative.    Physical Exam Updated Vital Signs BP (!)  100/54   Pulse 94   Temp 98.2 F (36.8 C) (Oral)   Resp 12   Ht 4\' 7"  (1.397 m)   Wt 83.7 kg (184 lb 8 oz)   SpO2 95%   BMI 42.88 kg/m   Physical Exam  Constitutional: She is oriented to person, place, and time. She appears well-developed and well-nourished. No distress.  HENT:  Head: Normocephalic and atraumatic.  Multiple missing teeth.  Chronic appearing left upper incisor without gingival swelling or fluctuance.  Anterior neck is normal.  Space under her tongue soft.  Tolerating secretions.  Oral airway patent.  Eyes: EOM are normal.  Neck: Normal range of motion.  Cardiovascular: Normal rate, regular rhythm and normal heart sounds.   Pulmonary/Chest: Effort normal and breath sounds normal.  Abdominal: Soft. She exhibits no distension. There is no tenderness.  Musculoskeletal:  Normal range of motion.  Neurological: She is alert and oriented to person, place, and time.  Skin: Skin is warm and dry.  Psychiatric: She has a normal mood and affect. Judgment normal.  Nursing note and vitals reviewed.    ED Treatments / Results  Labs (all labs ordered are listed, but only abnormal results are displayed) Labs Reviewed  BASIC METABOLIC PANEL - Abnormal; Notable for the following:       Result Value   Chloride 100 (*)    Glucose, Bld 105 (*)    BUN <5 (*)    Calcium 8.8 (*)    All other components within normal limits  CBC - Abnormal; Notable for the following:    WBC 10.7 (*)    Hemoglobin 10.6 (*)    HCT 35.0 (*)    MCH 24.0 (*)    RDW 16.6 (*)    Platelets 511 (*)    All other components within normal limits  I-STAT TROPONIN, ED    EKG  EKG Interpretation  Date/Time:  Sunday October 08 2016 04:58:48 EDT Ventricular Rate:  99 PR Interval:  134 QRS Duration: 88 QT Interval:  376 QTC Calculation: 482 R Axis:   22 Text Interpretation:  Normal sinus rhythm Prolonged QT Abnormal ECG No significant change was found Confirmed by Jola Schmidt [phone]) on 10/08/2016  8:50:35 AM       Radiology Dg Chest 2 View  Result Date: 10/08/2016 CLINICAL DATA:  41 y/o  F; chest pain. EXAM: CHEST  2 VIEW COMPARISON:  04/16/2016 chest radiograph FINDINGS: The heart size and mediastinal contours are within normal limits. Low lung volumes. Both lungs are clear. The visualized skeletal structures are unremarkable. IMPRESSION: Low lung volumes.  No acute pulmonary process identified. Electronically Signed   By: Kristine Garbe M.D.   On: 10/08/2016 05:48    Procedures Procedures (including critical care time)  Medications Ordered in ED Medications - No data to display   Initial Impression / Assessment and Plan / ED Course  I have reviewed the triage vital signs and the nursing notes.  Pertinent labs & imaging results that were available during my care of the patient were reviewed by me and considered in my medical decision making (see chart for details).     Dental pain and likely bronchitis.  Doubt CHF.  Doubt ACS.  Doubt PE.  Outpatient dental follow-up.  Outpatient primary care follow-up.  Home with Augmentin.  Patient understands return the ER for new or worsening symptoms  Final Clinical Impressions(s) / ED Diagnoses   Final diagnoses:  Bronchitis    New Prescriptions New Prescriptions   AMOXICILLIN-CLAVULANATE (AUGMENTIN) 875-125 MG TABLET    Take 1 tablet by mouth every 12 (twelve) hours.     Jola Schmidt, MD 10/08/16 1052

## 2016-10-08 NOTE — ED Notes (Signed)
Pt becoming very upset stating "noone is listening to me and noone has been in to see me for 45 minutes". This RN advised patient that staff has been with very critical patients and apologized for her wait. Pt yelling stating "i am going to tell news 2, this is ridiculous that noone will listen to me."

## 2016-10-08 NOTE — ED Notes (Signed)
Pt has multiple complaints, reports staff infection for months that she has been being treated for without relief. Pt also reports productive cough and chest burning. Vss. nad

## 2016-10-18 ENCOUNTER — Ambulatory Visit: Payer: Self-pay | Admitting: Physician Assistant

## 2016-10-25 IMAGING — DX DG HIP COMPLETE 2+V*R*
3 series · 3 of 3 positions shown · non-contrast
Comparison: None.

CLINICAL DATA: Right hip pain, no known injury

EXAM:
RIGHT HIP - COMPLETE 2+ VIEW

[pelvis ap]
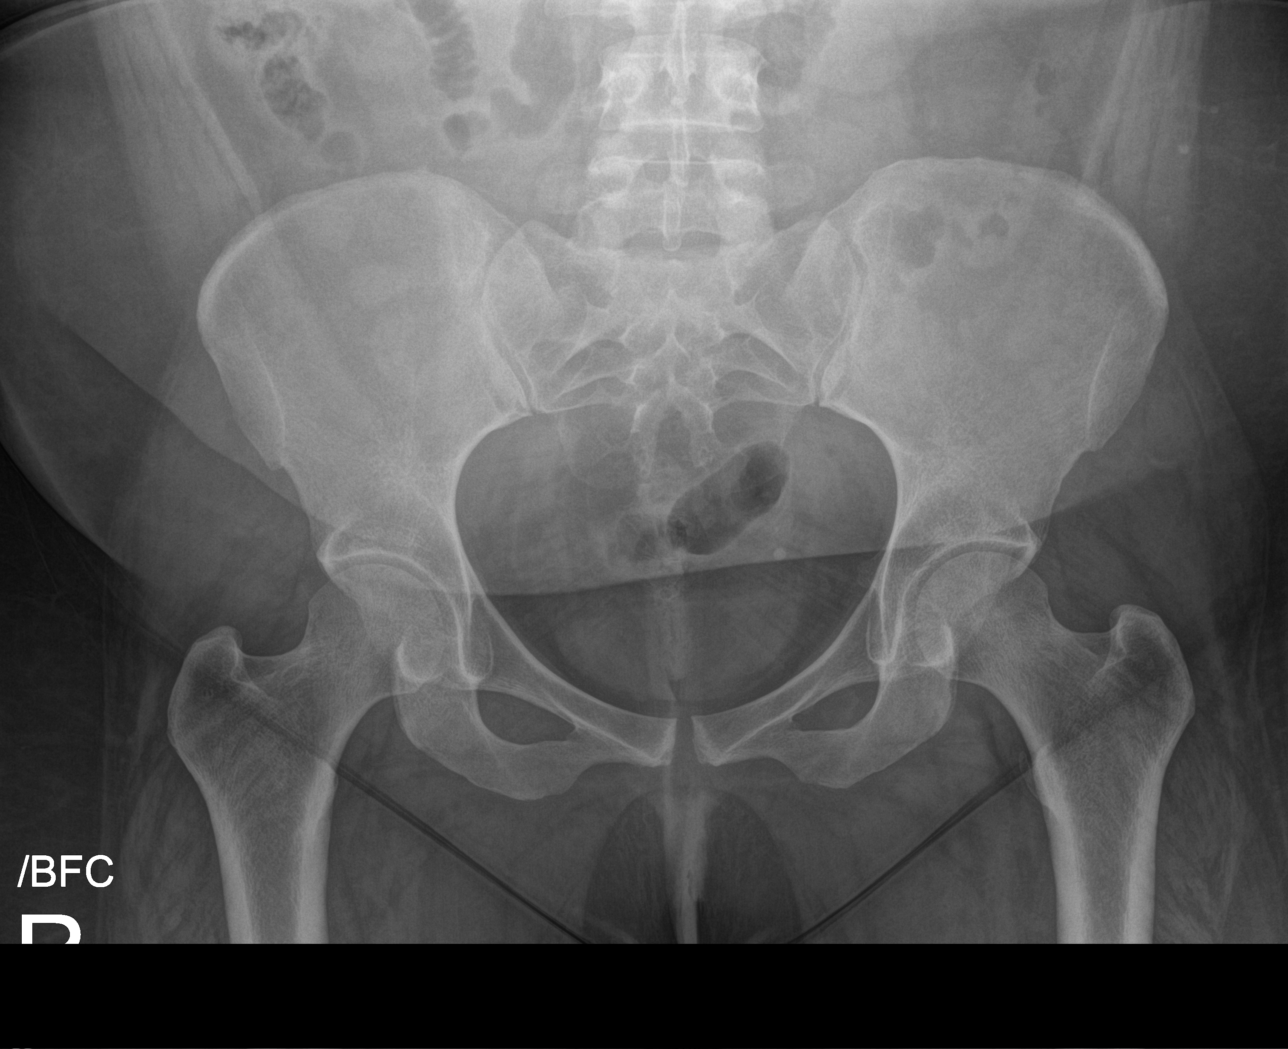

[hip ap]
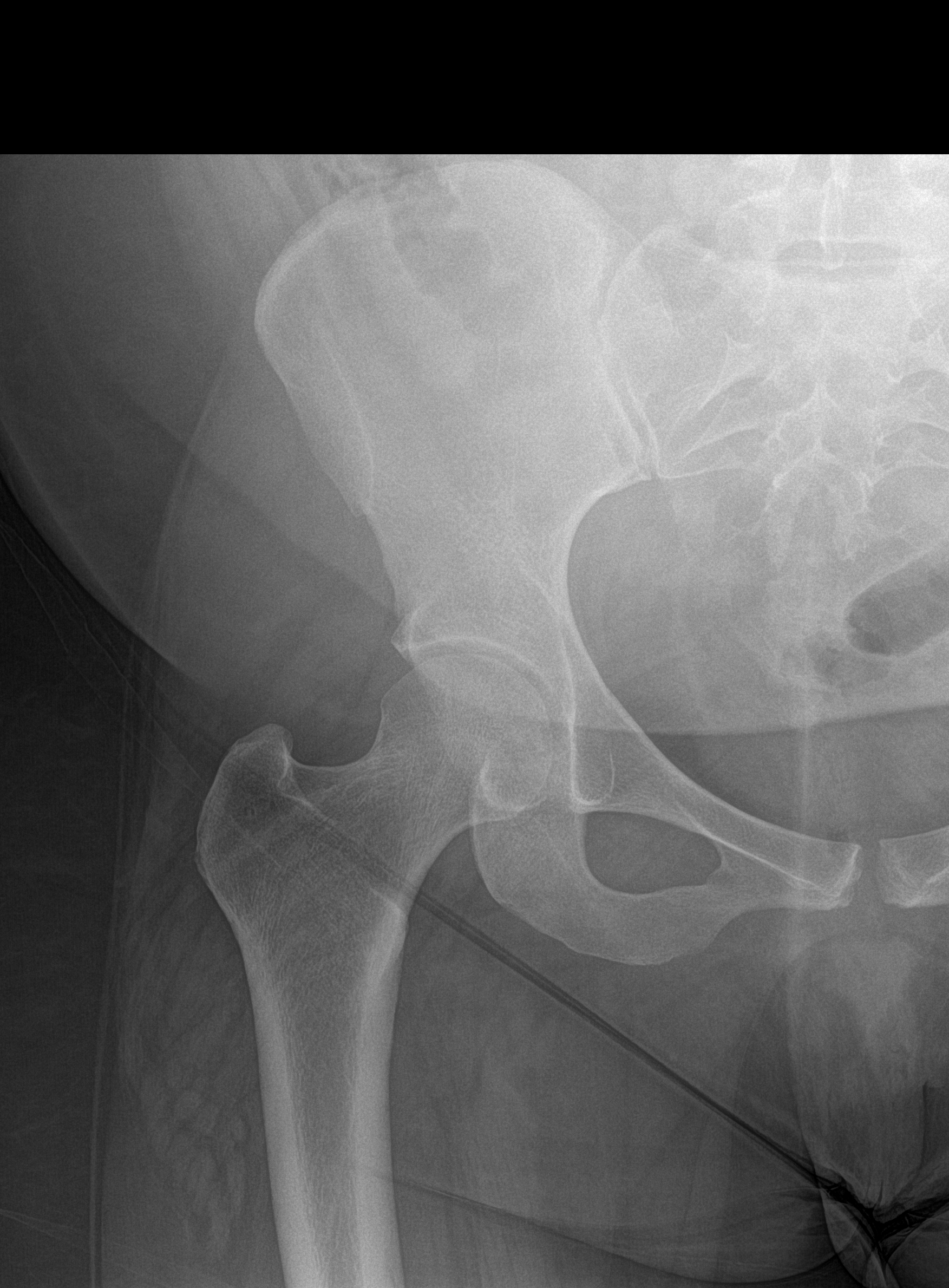

[hip lat]
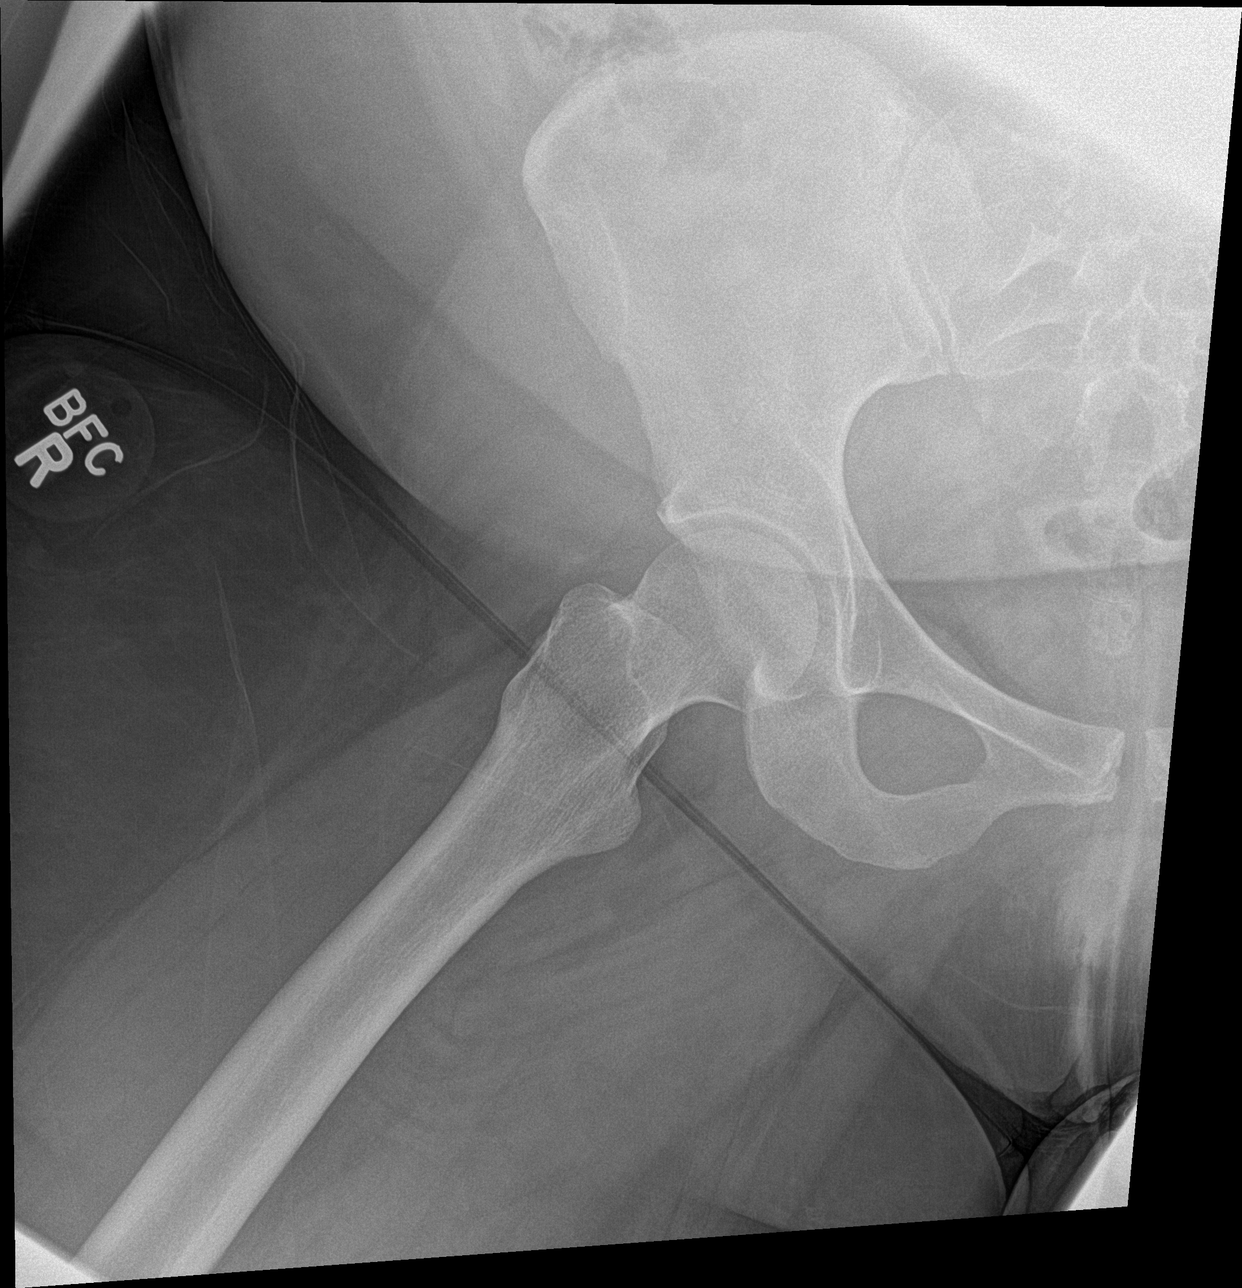

[3 of 3 positions shown; findings below may reference images not displayed]

FINDINGS: No fracture or dislocation is seen.

The joint spaces are preserved.

Visualized bony pelvis appears intact.

Lower lumbar spine is within normal limits.
IMPRESSION: Negative right hip radiographs.

## 2016-11-08 ENCOUNTER — Ambulatory Visit (INDEPENDENT_AMBULATORY_CARE_PROVIDER_SITE_OTHER): Payer: BLUE CROSS/BLUE SHIELD | Admitting: Physician Assistant

## 2016-11-08 ENCOUNTER — Encounter: Payer: Self-pay | Admitting: Physician Assistant

## 2016-11-08 VITALS — BP 106/72 | HR 88 | Temp 99.2°F | Resp 16 | Ht <= 58 in | Wt 185.0 lb

## 2016-11-08 DIAGNOSIS — M7989 Other specified soft tissue disorders: Secondary | ICD-10-CM | POA: Diagnosis not present

## 2016-11-08 DIAGNOSIS — M79662 Pain in left lower leg: Secondary | ICD-10-CM

## 2016-11-08 DIAGNOSIS — E669 Obesity, unspecified: Secondary | ICD-10-CM

## 2016-11-08 DIAGNOSIS — Z1231 Encounter for screening mammogram for malignant neoplasm of breast: Secondary | ICD-10-CM | POA: Diagnosis not present

## 2016-11-08 DIAGNOSIS — D509 Iron deficiency anemia, unspecified: Secondary | ICD-10-CM

## 2016-11-08 DIAGNOSIS — Z7689 Persons encountering health services in other specified circumstances: Secondary | ICD-10-CM | POA: Diagnosis not present

## 2016-11-08 DIAGNOSIS — M797 Fibromyalgia: Secondary | ICD-10-CM

## 2016-11-08 DIAGNOSIS — Z1239 Encounter for other screening for malignant neoplasm of breast: Secondary | ICD-10-CM

## 2016-11-08 DIAGNOSIS — R5383 Other fatigue: Secondary | ICD-10-CM

## 2016-11-08 MED ORDER — AMITRIPTYLINE HCL 50 MG PO TABS: 50.0000 mg | ORAL_TABLET | Freq: Every day | ORAL | 2 refills | Status: DC
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
## 2016-11-08 NOTE — Patient Instructions (Signed)

## 2016-11-13 LAB — HM COLONOSCOPY

## 2016-11-21 ENCOUNTER — Telehealth: Payer: Self-pay | Admitting: Physician Assistant

## 2016-11-21 DIAGNOSIS — M79604 Pain in right leg: Secondary | ICD-10-CM

## 2016-11-21 DIAGNOSIS — M79605 Pain in left leg: Principal | ICD-10-CM

## 2016-11-21 NOTE — Telephone Encounter (Signed)
Pt is returning call.  CB#[phone]/MW

## 2016-11-21 NOTE — Telephone Encounter (Signed)
[facility] pain management declined referral. I can try to refer to [facility] pain management or somewhere in [place], or patient may continue to follow up with vascular surgery to manage condition.

## 2016-11-21 NOTE — Telephone Encounter (Signed)
LMTCB 11/21/2016  Thanks,   -Mickel Baas

## 2016-11-21 NOTE — Telephone Encounter (Signed)
Pt advised.  She would like to try [facility] Pain management.   Thanks,   -Mickel Baas

## 2016-11-21 NOTE — Telephone Encounter (Signed)
LMTCB 11/21/2016

## 2016-11-22 ENCOUNTER — Encounter: Payer: Self-pay | Admitting: Physician Assistant

## 2016-11-22 NOTE — Telephone Encounter (Signed)
Referral placed for [facility] pain management.

## 2016-11-24 ENCOUNTER — Other Ambulatory Visit: Payer: Self-pay | Admitting: Physician Assistant

## 2016-11-24 DIAGNOSIS — Z9114 Patient's other noncompliance with medication regimen: Secondary | ICD-10-CM

## 2016-11-24 DIAGNOSIS — Z91148 Patient's other noncompliance with medication regimen for other reason: Secondary | ICD-10-CM | POA: Insufficient documentation

## 2016-11-24 DIAGNOSIS — F1111 Opioid abuse, in remission: Secondary | ICD-10-CM

## 2016-11-24 NOTE — Progress Notes (Signed)
Reviewed [facility] pain management records with appropriate diagnoses updated. Please see scanned media for full records.

## 2016-11-26 ENCOUNTER — Emergency Department (HOSPITAL_COMMUNITY): Payer: BLUE CROSS/BLUE SHIELD

## 2016-11-26 ENCOUNTER — Other Ambulatory Visit: Payer: Self-pay

## 2016-11-26 ENCOUNTER — Emergency Department (HOSPITAL_COMMUNITY)
Admission: EM | Admit: 2016-11-26 | Discharge: 2016-11-26 | Disposition: A | Discharge status: Home / Self Care | Payer: BLUE CROSS/BLUE SHIELD | Attending: Emergency Medicine | Admitting: Emergency Medicine

## 2016-11-26 DIAGNOSIS — I509 Heart failure, unspecified: Secondary | ICD-10-CM | POA: Insufficient documentation

## 2016-11-26 DIAGNOSIS — Z86718 Personal history of other venous thrombosis and embolism: Secondary | ICD-10-CM | POA: Diagnosis not present

## 2016-11-26 DIAGNOSIS — M79662 Pain in left lower leg: Secondary | ICD-10-CM | POA: Diagnosis not present

## 2016-11-26 DIAGNOSIS — K047 Periapical abscess without sinus: Secondary | ICD-10-CM | POA: Diagnosis not present

## 2016-11-26 DIAGNOSIS — Z79899 Other long term (current) drug therapy: Secondary | ICD-10-CM | POA: Insufficient documentation

## 2016-11-26 DIAGNOSIS — Z91040 Latex allergy status: Secondary | ICD-10-CM | POA: Diagnosis not present

## 2016-11-26 DIAGNOSIS — K0889 Other specified disorders of teeth and supporting structures: Secondary | ICD-10-CM | POA: Diagnosis present

## 2016-11-26 MED ORDER — ENOXAPARIN SODIUM 100 MG/ML ~~LOC~~ SOLN
1.0000 mg/kg | Freq: Once | SUBCUTANEOUS | Status: AC
  Administered 2016-11-26: 85 mg via SUBCUTANEOUS
  Filled 2016-11-26: qty 1

## 2016-11-26 MED ORDER — OXYCODONE-ACETAMINOPHEN 5-325 MG PO TABS
1.0000 | ORAL_TABLET | Freq: Once | ORAL | Status: AC
  Administered 2016-11-26: 1 via ORAL

## 2016-11-26 MED ORDER — HYDROCODONE-ACETAMINOPHEN 5-325 MG PO TABS: 1.0000 | ORAL_TABLET | Freq: Four times a day (QID) | ORAL | 0 refills | Status: AC | PRN

## 2016-11-26 MED ORDER — PENICILLIN V POTASSIUM 500 MG PO TABS: 500.0000 mg | ORAL_TABLET | Freq: Four times a day (QID) | ORAL | 0 refills | Status: AC

## 2016-11-26 MED ORDER — OXYCODONE-ACETAMINOPHEN 5-325 MG PO TABS
ORAL_TABLET | ORAL | Status: AC
  Administered 2016-11-26: 1 via ORAL
  Filled 2016-11-26: qty 1

## 2016-11-26 NOTE — ED Triage Notes (Signed)
Pt reports dental pain intermittently over the last year and bilateral leg pain x2 days. Pt reports history of venous congestion.

## 2016-11-26 NOTE — ED Provider Notes (Signed)
[facility] EMERGENCY DEPARTMENT Provider Note   CSN: [id number] Arrival date & time: 11/26/16  1735     History   Chief Complaint Chief Complaint  Patient presents with  . Dental Pain  . Leg Pain    HPI Dominique [name] is a 41 y.o. female.  Patient complains of a painful tooth and pain in her left leg.  She has a history of DVT   The history is provided by the patient. No language interpreter was used.  Dental Pain   This is a new problem. The current episode started 2 days ago. The problem occurs constantly. The problem has not changed since onset.The pain is at a severity of 6/10. The pain is moderate.  Leg Pain   This is a new problem. The current episode started more than 2 days ago. The problem occurs constantly. The problem has not changed since onset.The pain is present in the left lower leg. The quality of the pain is described as aching. The pain is at a severity of 6/10.    Past Medical History:  Diagnosis Date  . CHF (congestive heart failure) ([place])   . DVT (deep venous thrombosis) ([place])   . GERD (gastroesophageal reflux disease)   . History of recurrent UTIs   . Obesity   . Raynaud disease 12/21/2011  . Varicose veins     Patient Active Problem List   Diagnosis Date Noted  . History of opioid abuse 11/24/2016  . Pain management contract terminated 11/24/2016  . Cellulitis 12/08/2015  . H/O gastroesophageal reflux (GERD) 12/08/2015  . Hypokalemia 12/08/2015  . Thrombocytosis ([place]) 12/08/2015  . Bilateral hand pain 11/11/2014  . Urinary incontinence 08/14/2014  . Hematuria 08/14/2014  . Right thigh pain 08/14/2014  . Dermatitis 07/02/2014  . Anemia, iron deficiency 03/09/2014  . Personal history of DVT (deep vein thrombosis)   . Skin lesion of breast 01/13/2014  . Fibromyalgia 12/11/2013  . Hallucinations 12/11/2013  . Total body pain 07/18/2013  . Internal hemorrhoid, bleeding 03/27/2013  . Hand swelling 12/27/2012  . 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 Cystitis, chronic 10/15/2012  . Leg swelling 06/18/2012  . Obesity (BMI 30-39.9) 02/05/2012  . Varicose veins of lower extremities with other complications 73/22/0254    Past Surgical History:  Procedure Laterality Date  . ABDOMINAL HYSTERECTOMY    . APPENDECTOMY    . BALLOON DILATION N/A 04/02/2014   Performed by Ronald Lobo, MD at [facility]  . CESAREAN SECTION    . COLONOSCOPY WITH PROPOFOL N/A 03/26/2012   Performed by Cleotis Nipper, MD at [facility]  . ENDOVENOUS ABLATION SAPHENOUS VEIN W/ LASER  02-01-2012   right greater saphenous vein and stab phlebectomy 10-20 incisions right leg  by Curt Jews, MD  . ENDOVENOUS ABLATION SAPHENOUS VEIN W/ LASER Left 02-29-2012   left greater saphenous vein and stab phlebectomy left leg 10-20 incisions  by Curt Jews MD  . ENDOVENOUS ABLATION SAPHENOUS VEIN W/ LASER Left   . ESOPHAGOGASTRODUODENOSCOPY (EGD) WITH PROPOFOL N/A 04/02/2014   Performed by Ronald Lobo, MD at [facility]  . ESOPHAGOGASTRODUODENOSCOPY (EGD) WITH PROPOFOL N/A 03/26/2012   Performed by Cleotis Nipper, MD at [place]  . HERNIA REPAIR  2006    OB History    Gravida Para Term Preterm AB Living   2 2           SAB TAB Ectopic Multiple Live Births  Home Medications    Prior to Admission medications   Medication Sig Start Date End Date Taking? Authorizing Provider  amitriptyline (ELAVIL) 50 MG tablet Take 1 tablet (50 mg total) by mouth at bedtime. 11/08/16 02/06/17  Trinna Post, PA-C  b complex vitamins capsule Take 1 capsule by mouth daily. 10/10/15   Brunetta Genera, MD  HYDROcodone-acetaminophen (NORCO/VICODIN) 5-325 MG tablet Take 1 tablet every 6 (six) hours as needed by mouth for moderate pain. 11/26/16   Milton Ferguson, MD  hydrocortisone 2.5 % ointment Apply 1 application topically 2 (two) times daily as needed (for irritation).    [provider]  omeprazole (PRILOSEC) 40 MG  capsule Take 40 mg by mouth 2 (two) times daily.    [provider]  penicillin v potassium (VEETID) 500 MG tablet Take 1 tablet (500 mg total) 4 (four) times daily for 7 days by mouth. 11/26/16 12/03/16  Milton Ferguson, MD  valACYclovir (VALTREX) 1000 MG tablet Take 500 mg by mouth 2 (two) times daily as needed (for outbreaks).    [provider]    Family History Family History  Problem Relation Age of Onset  . Diabetes Mother   . Heart disease Mother   . Hyperlipidemia Mother   . Hypertension Mother   . Other Mother        amputation, varicose veins  . Hyperlipidemia Father   . Hypertension Father   . Peripheral vascular disease Father   . Other Father        varicose veins  . Deep vein thrombosis Brother   . Other Brother        varicose veins  . Leukemia Maternal Grandmother   . Diabetes Paternal Grandfather     Social History Social History   Tobacco Use  . Smoking status: Never Smoker  . Smokeless tobacco: Never Used  Substance Use Topics  . Alcohol use: No    Alcohol/week: 0.0 oz  . Drug use: No    Comment: former narcotic presription abuse     Allergies   Contrast media [iodinated diagnostic agents]; Latex; Nsaids; Doxycycline; Eggs or egg-derived products; Lactose intolerance (gi); and Zofran [ondansetron hcl]   Review of Systems Review of Systems  Constitutional: Negative for appetite change and fatigue.  HENT: Negative for congestion, ear discharge and sinus pressure.        Right upper toothache  Eyes: Negative for discharge.  Respiratory: Negative for cough.   Cardiovascular: Negative for chest pain.  Gastrointestinal: Negative for abdominal pain and diarrhea.  Genitourinary: Negative for frequency and hematuria.  Musculoskeletal: Negative for back pain.       Left lower leg pain  Skin: Negative for rash.  Neurological: Negative for seizures and headaches.  Psychiatric/Behavioral: Negative for hallucinations.     Physical  Exam Updated Vital Signs BP 104/74 (BP Location: Left Arm)   Pulse 82   Temp 98.8 F (37.1 C) (Oral)   Resp 16   Ht 4\' 7"  (1.397 m)   Wt 83.9 kg (185 lb)   SpO2 98%   BMI 43.00 kg/m   Physical Exam  Constitutional: She is oriented to person, place, and time. She appears well-developed.  HENT:  Head: Normocephalic.  Patient with poor dentition.  Tenderness to right upper tooth  Eyes: Conjunctivae and EOM are normal. No scleral icterus.  Neck: Neck supple. No thyromegaly present.  Cardiovascular: Normal rate and regular rhythm. Exam reveals no gallop and no friction rub.  No murmur heard. Pulmonary/Chest: No  stridor. She has no wheezes. She has no rales. She exhibits no tenderness.  Abdominal: She exhibits no distension. There is no tenderness. There is no rebound.  Musculoskeletal: Normal range of motion. She exhibits edema.  Mild tenderness and swelling to left lower leg  Lymphadenopathy:    She has no cervical adenopathy.  Neurological: She is oriented to person, place, and time. She exhibits normal muscle tone. Coordination normal.  Skin: No rash noted. No erythema.  Psychiatric: She has a normal mood and affect. Her behavior is normal.     ED Treatments / Results  Labs (all labs ordered are listed, but only abnormal results are displayed) Labs Reviewed - No data to display  EKG  EKG Interpretation None       Radiology Dg Tibia/fibula Left  Result Date: 11/26/2016 CLINICAL DATA:  Left leg pain for 2 days, no known injury, initial encounter EXAM: LEFT TIBIA AND FIBULA - 2 VIEW COMPARISON:  None. FINDINGS: No acute fracture or dislocation is noted. Varicosities are identified both medially and laterally. No gross soft tissue abnormality is seen. IMPRESSION: No acute abnormality noted.  Multiple varicosities are seen. Electronically Signed   By: Inez Catalina M.D.   On: 11/26/2016 19:11    Procedures Procedures (including critical care time)  Medications Ordered  in ED Medications  enoxaparin (LOVENOX) injection 85 mg (not administered)  oxyCODONE-acetaminophen (PERCOCET/ROXICET) 5-325 MG per tablet 1 tablet (1 tablet Oral Given 11/26/16 1938)     Initial Impression / Assessment and Plan / ED Course  I have reviewed the triage vital signs and the nursing notes.  Pertinent labs & imaging results that were available during my care of the patient were reviewed by me and considered in my medical decision making (see chart for details).     Patient was possible abscess to she will be placed on penicillin and given Vicodin for pain.  She is also going to get an ultrasound of her left lower leg to rule out DVT tomorrow  Final Clinical Impressions(s) / ED Diagnoses   Final diagnoses:  Abscessed tooth    ED Discharge Orders        Ordered    penicillin v potassium (VEETID) 500 MG tablet  4 times daily     11/26/16 1946    HYDROcodone-acetaminophen (NORCO/VICODIN) 5-325 MG tablet  Every 6 hours PRN     11/26/16 1946    US Venous Img Lower Unilateral Left     11/26/16 1947       Milton Ferguson, MD 11/26/16 1950

## 2016-11-26 NOTE — Discharge Instructions (Addendum)
Follow-up with a dentist in the next 1-2 weeks.  Follow-up to get an ultrasound of your leg tomorrow.

## 2016-11-28 ENCOUNTER — Telehealth: Payer: Self-pay | Admitting: Physician Assistant

## 2016-11-28 NOTE — Telephone Encounter (Signed)
[facility] declined pain management referral. If patient wishes to discuss this further, I will be happy to talk to her.

## 2016-11-29 NOTE — Telephone Encounter (Signed)
Patient advised  ED 

## 2016-12-06 ENCOUNTER — Emergency Department: Payer: BLUE CROSS/BLUE SHIELD

## 2016-12-06 ENCOUNTER — Encounter: Payer: Self-pay | Admitting: Emergency Medicine

## 2016-12-06 ENCOUNTER — Emergency Department
Admission: EM | Admit: 2016-12-06 | Discharge: 2016-12-06 | Disposition: A | Discharge status: Home / Self Care | Payer: BLUE CROSS/BLUE SHIELD | Attending: Emergency Medicine | Admitting: Emergency Medicine

## 2016-12-06 DIAGNOSIS — I509 Heart failure, unspecified: Secondary | ICD-10-CM | POA: Insufficient documentation

## 2016-12-06 DIAGNOSIS — M7918 Myalgia, other site: Secondary | ICD-10-CM | POA: Insufficient documentation

## 2016-12-06 DIAGNOSIS — Z79899 Other long term (current) drug therapy: Secondary | ICD-10-CM | POA: Insufficient documentation

## 2016-12-06 DIAGNOSIS — Z91040 Latex allergy status: Secondary | ICD-10-CM | POA: Insufficient documentation

## 2016-12-06 LAB — COMPREHENSIVE METABOLIC PANEL
ALK PHOS: 128 U/L — AB (ref 38–126)
ALT: 30 U/L (ref 14–54)
ANION GAP: 9 (ref 5–15)
AST: 48 U/L — ABNORMAL HIGH (ref 15–41)
Albumin: 3.5 g/dL (ref 3.5–5.0)
BUN: <5 mg/dL — ABNORMAL LOW (ref 6–20)
CALCIUM: 8.6 mg/dL — AB (ref 8.9–10.3)
CHLORIDE: 99 mmol/L — AB (ref 101–111)
CO2: 29 mmol/L (ref 22–32)
Creatinine, Ser: 0.63 mg/dL (ref 0.44–1.00)
GFR calc non Af Amer: >60 mL/min (ref >60)
Glucose, Bld: 85 mg/dL (ref 65–99)
POTASSIUM: 3.6 mmol/L (ref 3.5–5.1)
SODIUM: 137 mmol/L (ref 135–145)
Total Bilirubin: 0.3 mg/dL (ref 0.3–1.2)
Total Protein: 7.5 g/dL (ref 6.5–8.1)

## 2016-12-06 LAB — CBC
HCT: 42.6 % (ref 35.0–47.0)
HEMOGLOBIN: 13.6 g/dL (ref 12.0–16.0)
MCH: 27.8 pg (ref 26.0–34.0)
MCHC: 31.9 g/dL — AB (ref 32.0–36.0)
MCV: 87.2 fL (ref 80.0–100.0)
Platelets: 349 10*3/uL (ref 150–440)
RBC: 4.88 MIL/uL (ref 3.80–5.20)
RDW: 23.9 % — ABNORMAL HIGH (ref 11.5–14.5)
WBC: 7.5 10*3/uL (ref 3.6–11.0)

## 2016-12-06 LAB — BRAIN NATRIURETIC PEPTIDE: B Natriuretic Peptide: 146 pg/mL — ABNORMAL HIGH (ref 0.0–100.0)

## 2016-12-06 MED ORDER — CYCLOBENZAPRINE HCL 5 MG PO TABS: ORAL_TABLET | ORAL | 0 refills | Status: AC

## 2016-12-06 MED ORDER — OXYCODONE-ACETAMINOPHEN 5-325 MG PO TABS
1.0000 | ORAL_TABLET | Freq: Once | ORAL | Status: AC
  Administered 2016-12-06: 1 via ORAL
  Filled 2016-12-06: qty 1

## 2016-12-06 MED ORDER — ACETAMINOPHEN 325 MG PO TABS: 650.0000 mg | ORAL_TABLET | ORAL | 0 refills | Status: AC | PRN

## 2016-12-06 MED ORDER — ORPHENADRINE CITRATE 30 MG/ML IJ SOLN
60.0000 mg | Freq: Two times a day (BID) | INTRAMUSCULAR | Status: DC
  Administered 2016-12-06: 60 mg via INTRAMUSCULAR
  Filled 2016-12-06: qty 2

## 2016-12-06 MED ORDER — OXYCODONE-ACETAMINOPHEN 5-325 MG PO TABS
0.5000 | ORAL_TABLET | Freq: Once | ORAL | Status: AC
  Administered 2016-12-06: 0.5 via ORAL
  Filled 2016-12-06: qty 1

## 2016-12-06 MED ORDER — PROMETHAZINE HCL 25 MG/ML IJ SOLN
25.0000 mg | Freq: Once | INTRAMUSCULAR | Status: AC
  Administered 2016-12-06: 25 mg via INTRAVENOUS
  Filled 2016-12-06: qty 1
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
## 2016-12-06 NOTE — ED Notes (Signed)
Pt had some redness around iv in left ac when administering Phenergan. Pt stated there was no itching or burning so continued giving 12.5mg  at this site.

## 2016-12-06 NOTE — ED Provider Notes (Signed)
ED ECG REPORT I, Rudene Re, the attending physician, personally viewed and interpreted this ECG.  normal sinus rhythm, rate of 66, normal intervals, normal axis, no ST elevations or depressions.   Rudene Re, MD 12/06/16 [phone]
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
## 2016-12-06 NOTE — ED Notes (Addendum)
Pt contact at present [employer]- [phone]

## 2016-12-11 ENCOUNTER — Telehealth: Payer: Self-pay | Admitting: Physician Assistant

## 2016-12-11 NOTE — Telephone Encounter (Signed)
FYI--[facility] pain clinic declined referral

## 2016-12-12 NOTE — Telephone Encounter (Signed)
Noted, thank you

## 2016-12-14 ENCOUNTER — Emergency Department (HOSPITAL_COMMUNITY)
Admission: EM | Admit: 2016-12-14 | Discharge: 2016-12-14 | Disposition: A | Discharge status: Home / Self Care | Payer: BLUE CROSS/BLUE SHIELD | Attending: Emergency Medicine | Admitting: Emergency Medicine

## 2016-12-14 ENCOUNTER — Encounter (HOSPITAL_COMMUNITY): Payer: Self-pay

## 2016-12-14 ENCOUNTER — Emergency Department (HOSPITAL_COMMUNITY): Payer: BLUE CROSS/BLUE SHIELD

## 2016-12-14 DIAGNOSIS — Z590 Homelessness: Secondary | ICD-10-CM

## 2016-12-14 DIAGNOSIS — Z79899 Other long term (current) drug therapy: Secondary | ICD-10-CM | POA: Insufficient documentation

## 2016-12-14 DIAGNOSIS — R079 Chest pain, unspecified: Secondary | ICD-10-CM | POA: Insufficient documentation

## 2016-12-14 DIAGNOSIS — I110 Hypertensive heart disease with heart failure: Secondary | ICD-10-CM | POA: Insufficient documentation

## 2016-12-14 DIAGNOSIS — I509 Heart failure, unspecified: Secondary | ICD-10-CM

## 2016-12-14 DIAGNOSIS — F1110 Opioid abuse, uncomplicated: Secondary | ICD-10-CM

## 2016-12-14 DIAGNOSIS — Z5321 Procedure and treatment not carried out due to patient leaving prior to being seen by health care provider: Secondary | ICD-10-CM | POA: Diagnosis not present

## 2016-12-14 DIAGNOSIS — M79606 Pain in leg, unspecified: Secondary | ICD-10-CM | POA: Diagnosis not present

## 2016-12-14 LAB — BASIC METABOLIC PANEL
ANION GAP: 12 (ref 5–15)
BUN: 5 mg/dL — ABNORMAL LOW (ref 6–20)
CALCIUM: 9.2 mg/dL (ref 8.9–10.3)
CHLORIDE: 102 mmol/L (ref 101–111)
CO2: 20 mmol/L — AB (ref 22–32)
Creatinine, Ser: 0.7 mg/dL (ref 0.44–1.00)
GFR calc non Af Amer: >60 mL/min (ref >60)
Glucose, Bld: 117 mg/dL — ABNORMAL HIGH (ref 65–99)
Potassium: 3.6 mmol/L (ref 3.5–5.1)
Sodium: 134 mmol/L — ABNORMAL LOW (ref 135–145)

## 2016-12-14 LAB — CBC
HCT: 42.4 % (ref 36.0–46.0)
HEMOGLOBIN: 14.1 g/dL (ref 12.0–15.0)
MCH: 28.7 pg (ref 26.0–34.0)
MCHC: 33.3 g/dL (ref 30.0–36.0)
MCV: 86.2 fL (ref 78.0–100.0)
Platelets: 347 10*3/uL (ref 150–400)
RBC: 4.92 MIL/uL (ref 3.87–5.11)
RDW: 21.3 % — ABNORMAL HIGH (ref 11.5–15.5)
WBC: 10.2 10*3/uL (ref 4.0–10.5)

## 2016-12-14 LAB — I-STAT TROPONIN, ED: TROPONIN I, POC: 0.01 ng/mL (ref 0.00–0.08)

## 2016-12-14 LAB — I-STAT BETA HCG BLOOD, ED (MC, WL, AP ONLY)

## 2016-12-14 NOTE — ED Notes (Signed)
Pt comes to nurse first and states that she would like detox from pain pills

## 2016-12-14 NOTE — ED Notes (Signed)
Patient left eventhough she was encouraged to stay.

## 2016-12-14 NOTE — ED Notes (Addendum)
Pt wished to sign herself out.This tech encouraged her to stay. Patient shook and went back and sat down. Will not take patient out of system at this time.
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
## 2016-12-15 ENCOUNTER — Other Ambulatory Visit: Payer: Self-pay

## 2016-12-15 ENCOUNTER — Encounter (HOSPITAL_COMMUNITY): Payer: Self-pay | Admitting: *Deleted

## 2016-12-15 ENCOUNTER — Emergency Department (HOSPITAL_COMMUNITY)
Admission: EM | Admit: 2016-12-15 | Discharge: 2016-12-15 | Disposition: A | Discharge status: Home / Self Care | Payer: BLUE CROSS/BLUE SHIELD | Source: Home / Self Care | Attending: Emergency Medicine | Admitting: Emergency Medicine

## 2016-12-15 DIAGNOSIS — Z5900 Homelessness unspecified: Secondary | ICD-10-CM

## 2016-12-15 DIAGNOSIS — F1110 Opioid abuse, uncomplicated: Secondary | ICD-10-CM

## 2016-12-15 NOTE — Discharge Instructions (Signed)
To find a primary care or specialty doctor please call [phone] or [phone] to access "[place] Find a Doctor Service." ° °You may also go on the [place] website at [email or web address] ° °There are also multiple [facility], [facility] and [facility] practices throughout the [place] that are frequently accepting new patients. You may find a clinic that is close to your home and contact them. ° °[facility] -  °[address] °[place] [address] °[phone] ° ° °[facility] -  °[address] °[place] [address] °[phone] ° ° °[facility] - °[address]  °[place] [address] °[phone] ° ° °

## 2016-12-15 NOTE — ED Notes (Signed)
Pt stated "I live here and there.  I'm homeless."

## 2016-12-15 NOTE — ED Triage Notes (Signed)
Pt stated "I'm detoxing from pain pills."

## 2016-12-15 NOTE — ED Provider Notes (Signed)
TIME SEEN: 2:23 AM  CHIEF COMPLAINT: I need detox from pain pills"  HPI: Patient is a 41 year old female with history of CHF, DVT who presents to the emergency department requesting detox from pain medication.  States she has been taking Percocet for the past 2 years.  She is unable to tell me when she last had any Percocet.  Denies any other illicit drug or alcohol use.  She refuses to tell me what symptoms she is having.  She states "you know what it is".  She denies SI, HI or hallucinations.  She denies any pain.  Denies vomiting or diarrhea.  No fever.  Patient is very uncooperative.  ROS: See HPI Constitutional: no fever  Eyes: no drainage  ENT: no runny nose   Cardiovascular:  no chest pain  Resp: no SOB  GI: no vomiting GU: no dysuria Integumentary: no rash  Allergy: no hives  Musculoskeletal: no leg swelling  Neurological: no slurred speech ROS otherwise negative  PAST MEDICAL HISTORY/PAST SURGICAL HISTORY:  Past Medical History:  Diagnosis Date  . CHF (congestive heart failure) ([place])   . DVT (deep venous thrombosis) ([place])   . GERD (gastroesophageal reflux disease)   . History of recurrent UTIs   . Obesity   . Raynaud disease 12/21/2011  . Varicose veins     MEDICATIONS:  Prior to Admission medications   Medication Sig Start Date End Date Taking? Authorizing Provider  acetaminophen (TYLENOL) 325 MG tablet Take 2 tablets (650 mg total) by mouth every 4 (four) hours as needed. Patient taking differently: Take 650 mg by mouth every 4 (four) hours as needed for mild pain.  12/06/16 12/06/17 Yes Laban Emperor, PA-C  amitriptyline (ELAVIL) 50 MG tablet Take 1 tablet (50 mg total) by mouth at bedtime. 11/08/16 02/06/17 Yes Trinna Post, PA-C  b complex vitamins capsule Take 1 capsule by mouth daily. 10/10/15  Yes Brunetta Genera, MD  ferrous sulfate 325 (65 FE) MG tablet Take 325 mg by mouth daily with breakfast.    Yes [provider]  fluocinonide cream  (LIDEX) 1.61 % 1 application apply on the skin twice a day as needed (PRN) 11/21/16  Yes [provider]  gabapentin (NEURONTIN) 300 MG capsule Take 300 mg by mouth 3 (three) times daily.   Yes [provider]  HYDROcodone-acetaminophen (NORCO/VICODIN) 5-325 MG tablet Take 1 tablet every 6 (six) hours as needed by mouth for moderate pain. 11/26/16  Yes Milton Ferguson, MD  omeprazole (PRILOSEC) 40 MG capsule Take 40 mg by mouth 2 (two) times daily.   Yes [provider]  promethazine (PHENERGAN) 12.5 MG tablet Take 12.5 mg by mouth every 6 (six) hours as needed for nausea or vomiting.   Yes [provider]  promethazine (PHENERGAN) 25 MG tablet Take 25 mg by mouth every 6 (six) hours as needed for nausea or vomiting.   Yes [provider]  traMADol (ULTRAM) 50 MG tablet Take 50 mg by mouth every 6 (six) hours as needed for severe pain.   Yes [provider]  valACYclovir (VALTREX) 1000 MG tablet Take 1,000 mg by mouth 2 (two) times daily as needed (for outbreaks).    Yes [provider]  cyclobenzaprine (FLEXERIL) 5 MG tablet Take 1-2 tablets 3 times daily as needed Patient not taking: Reported on 12/15/2016 12/06/16   Laban Emperor, PA-C  hydrocortisone 2.5 % ointment Apply 1 application topically 2 (two) times daily as needed (for irritation).    [provider]    ALLERGIES:  Allergies  Allergen Reactions  . Contrast Media [Iodinated Diagnostic Agents] Anaphylaxis, Swelling and Other (See Comments)    Reaction:  Tongue swelling  . Latex Anaphylaxis  . Nsaids Other (See Comments)    Reaction:  GI bleed   . Doxycycline Other (See Comments)    Reaction:  Leg pain  . Eggs Or Egg-Derived Products Nausea And Vomiting  . Lactose Intolerance (Gi) Diarrhea  . Zofran [Ondansetron Hcl] Hives and Other (See Comments)    Reaction:  Numbness of legs     SOCIAL HISTORY:  Social History   Tobacco Use  . Smoking status: Never  Smoker  . Smokeless tobacco: Never Used  Substance Use Topics  . Alcohol use: No    Alcohol/week: 0.0 oz    FAMILY HISTORY: Family History  Problem Relation Age of Onset  . Diabetes Mother   . Heart disease Mother   . Hyperlipidemia Mother   . Hypertension Mother   . Other Mother        amputation, varicose veins  . Hyperlipidemia Father   . Hypertension Father   . Peripheral vascular disease Father   . Other Father        varicose veins  . Deep vein thrombosis Brother   . Other Brother        varicose veins  . Leukemia Maternal Grandmother   . Diabetes Paternal Grandfather     EXAM: BP 116/77 (BP Location: Left Arm)   Pulse 91   Temp 99.5 F (37.5 C) (Oral)   Resp 16   Ht 4\' 7"  (1.397 m)   Wt 83.9 kg (185 lb)   SpO2 100%   BMI 43.00 kg/m  CONSTITUTIONAL: Alert and oriented and responds appropriately to questions. Well-appearing; well-nourished HEAD: Normocephalic EYES: Conjunctivae clear, pupils appear equal, EOMI ENT: normal nose; moist mucous membranes NECK: Supple, no meningismus, no nuchal rigidity, no LAD  CARD: RRR; S1 and S2 appreciated; no murmurs, no clicks, no rubs, no gallops RESP: Normal chest excursion without splinting or tachypnea; breath sounds clear and equal bilaterally; no wheezes, no rhonchi, no rales, no hypoxia or respiratory distress, speaking full sentences ABD/GI: Normal bowel sounds; non-distended; soft, non-tender, no rebound, no guarding, no peritoneal signs, no hepatosplenomegaly BACK:  The back appears normal and is non-tender to palpation, there is no CVA tenderness EXT: Normal ROM in all joints; non-tender to palpation; no edema; normal capillary refill; no cyanosis, no calf tenderness or swelling    SKIN: Normal color for age and race; warm; no rash NEURO: Moves all extremities equally PSYCH: Patient is uncooperative and has an aggressive tone.  She denies SI, HI or hallucinations.  MEDICAL DECISION MAKING: Patient here requesting  detox.  She looks very comfortable currently and is resting with normal vital signs.  I do not see any obvious sign of withdrawal.  Her abdomen is nontender.  She denies vomiting or diarrhea.  Discussed with patient that we do not do detox from the hospital for opiates and that she will need to seek outpatient treatment for this.  She has no psychiatric safety concern.  We will give her outpatient resources.  I do not feel she needs any medications at this time.  She does state that she is homeless.  I have also provided her with resources for shelters and social work assistance.  At this time, I do not feel there is any life-threatening condition present. I have reviewed and discussed all results (EKG, imaging,  lab, urine as appropriate) and exam findings with patient/family. I have reviewed nursing notes and appropriate previous records.  I feel the patient is safe to be discharged home without further emergent workup and can continue workup as an outpatient as needed. Discussed usual and customary return precautions. Patient/family verbalize understanding and are comfortable with this plan.  Outpatient follow-up has been provided if needed. All questions have been answered.      Ward, Delice Bison, DO 12/15/16 [phone]

## 2016-12-18 ENCOUNTER — Ambulatory Visit: Payer: BLUE CROSS/BLUE SHIELD | Admitting: Family

## 2017-01-01 ENCOUNTER — Other Ambulatory Visit: Payer: Self-pay | Admitting: Physician Assistant

## 2017-01-01 DIAGNOSIS — M797 Fibromyalgia: Secondary | ICD-10-CM

## 2017-04-16 ENCOUNTER — Telehealth: Payer: Self-pay | Admitting: Family Medicine

## 2017-04-16 NOTE — Telephone Encounter (Signed)
ROI - faxed to [facility] on 11.7.18

## 2017-04-17 ENCOUNTER — Telehealth: Payer: Self-pay | Admitting: Family Medicine

## 2017-04-17 NOTE — Telephone Encounter (Signed)
Received 8 pages from Dr Cristina Gong on 11/20/16 sent to [facility]

## 2017-04-17 NOTE — Telephone Encounter (Signed)
Faxed ROI to Dr [facility] on 10.31.18

## 2018-06-20 ENCOUNTER — Telehealth: Payer: Self-pay | Admitting: *Deleted

## 2018-06-20 ENCOUNTER — Other Ambulatory Visit: Payer: Self-pay | Admitting: *Deleted

## 2018-06-20 DIAGNOSIS — I83813 Varicose veins of bilateral lower extremities with pain: Secondary | ICD-10-CM
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
## 2018-06-26 ENCOUNTER — Encounter: Payer: Self-pay | Admitting: Vascular Surgery

## 2018-06-26 ENCOUNTER — Ambulatory Visit (INDEPENDENT_AMBULATORY_CARE_PROVIDER_SITE_OTHER): Payer: BLUE CROSS/BLUE SHIELD | Admitting: Vascular Surgery

## 2018-06-26 ENCOUNTER — Ambulatory Visit (HOSPITAL_COMMUNITY)
Admission: RE | Admit: 2018-06-26 | Discharge: 2018-06-26 | Disposition: A | Discharge status: Home / Self Care | Payer: BLUE CROSS/BLUE SHIELD | Source: Ambulatory Visit | Attending: Vascular Surgery | Admitting: Vascular Surgery

## 2018-06-26 ENCOUNTER — Other Ambulatory Visit: Payer: Self-pay

## 2018-06-26 VITALS — BP 111/76 | HR 87 | Temp 98.6°F | Resp 18 | Ht <= 58 in | Wt 185.0 lb

## 2018-06-26 DIAGNOSIS — I83812 Varicose veins of left lower extremities with pain: Secondary | ICD-10-CM

## 2018-06-26 DIAGNOSIS — I83813 Varicose veins of bilateral lower extremities with pain: Secondary | ICD-10-CM | POA: Diagnosis not present
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
## 2018-07-12 IMAGING — CR DG CHEST 2V
2 series · 2 of 2 positions shown · non-contrast
Comparison: None.

CLINICAL DATA: Pain and weakness

EXAM:
CHEST  2 VIEW

[w chest pa]
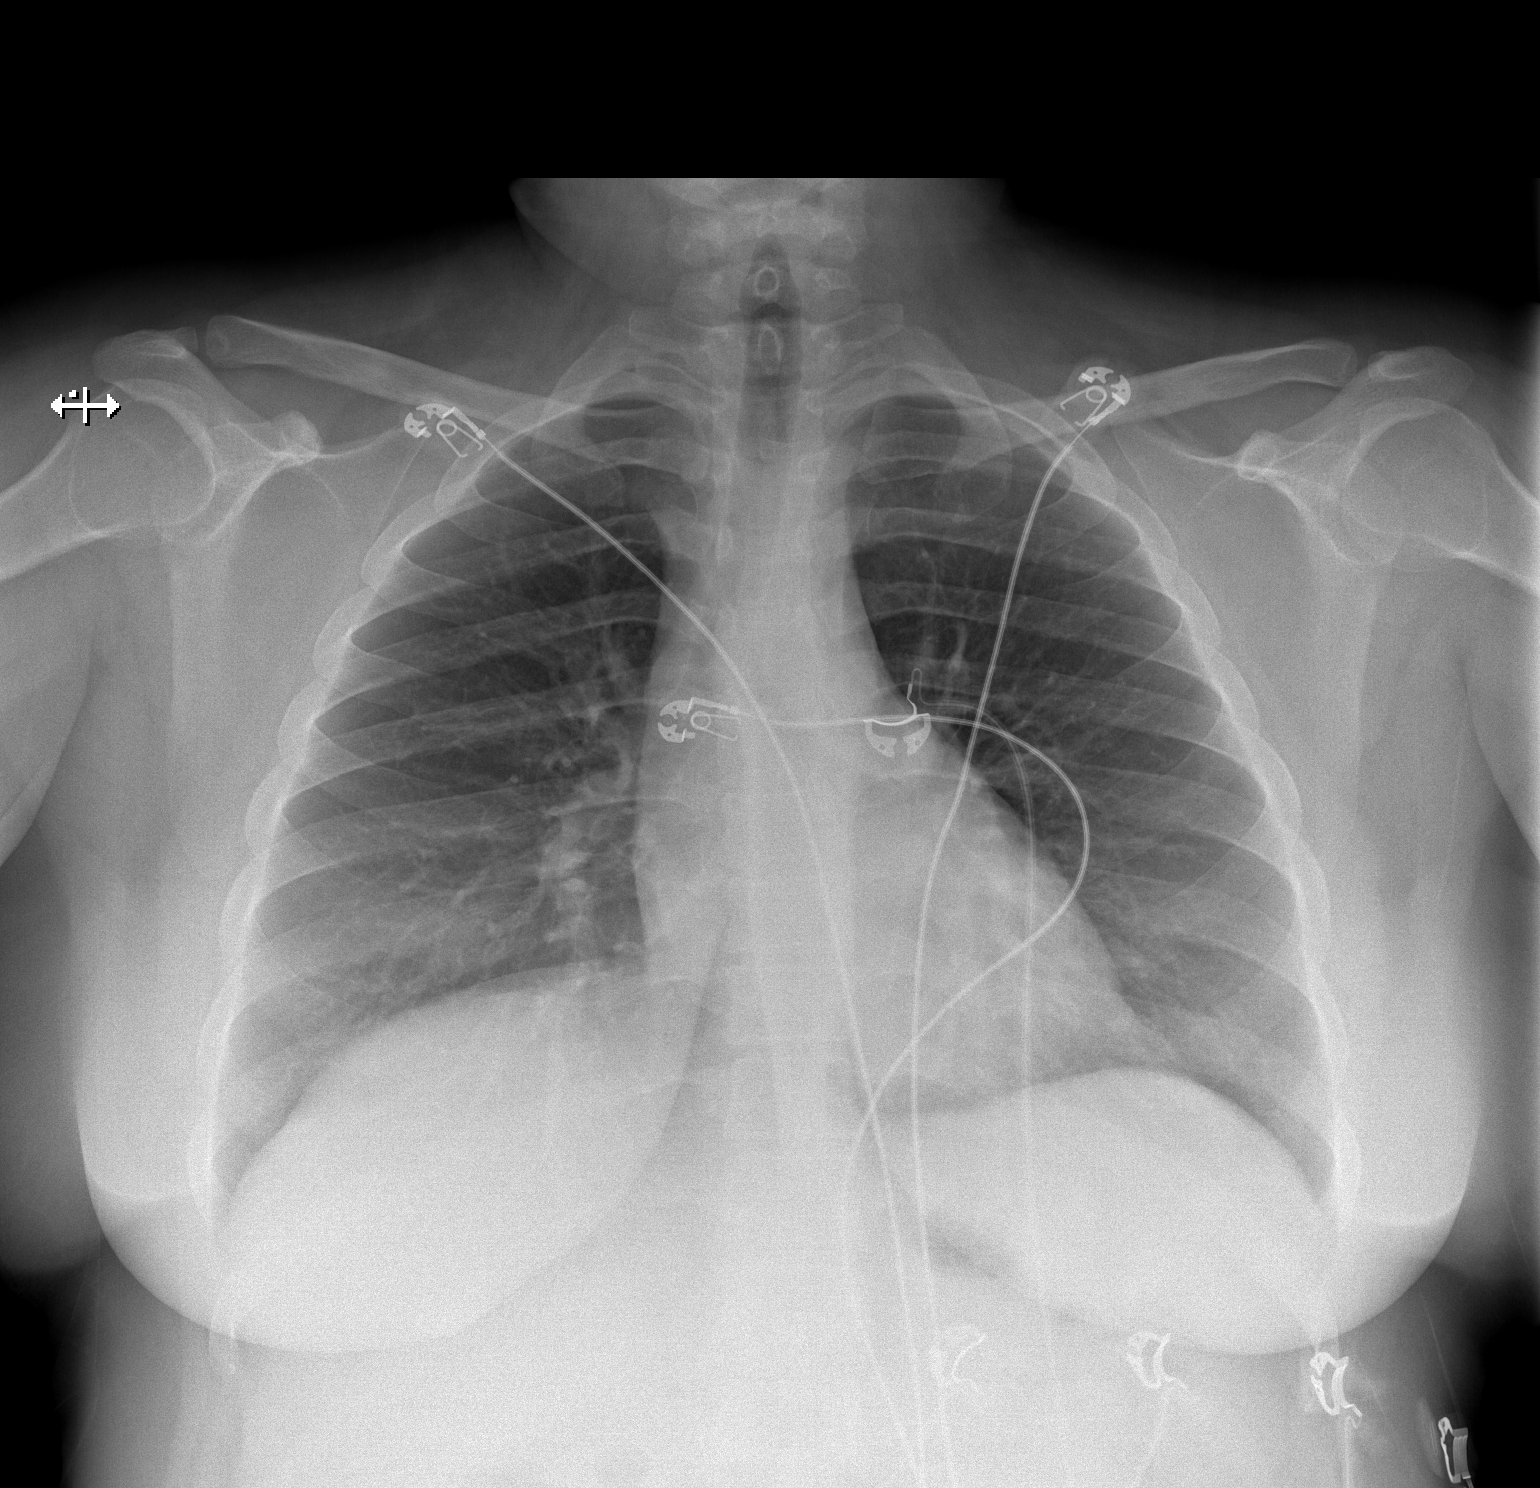

[w chest lat]
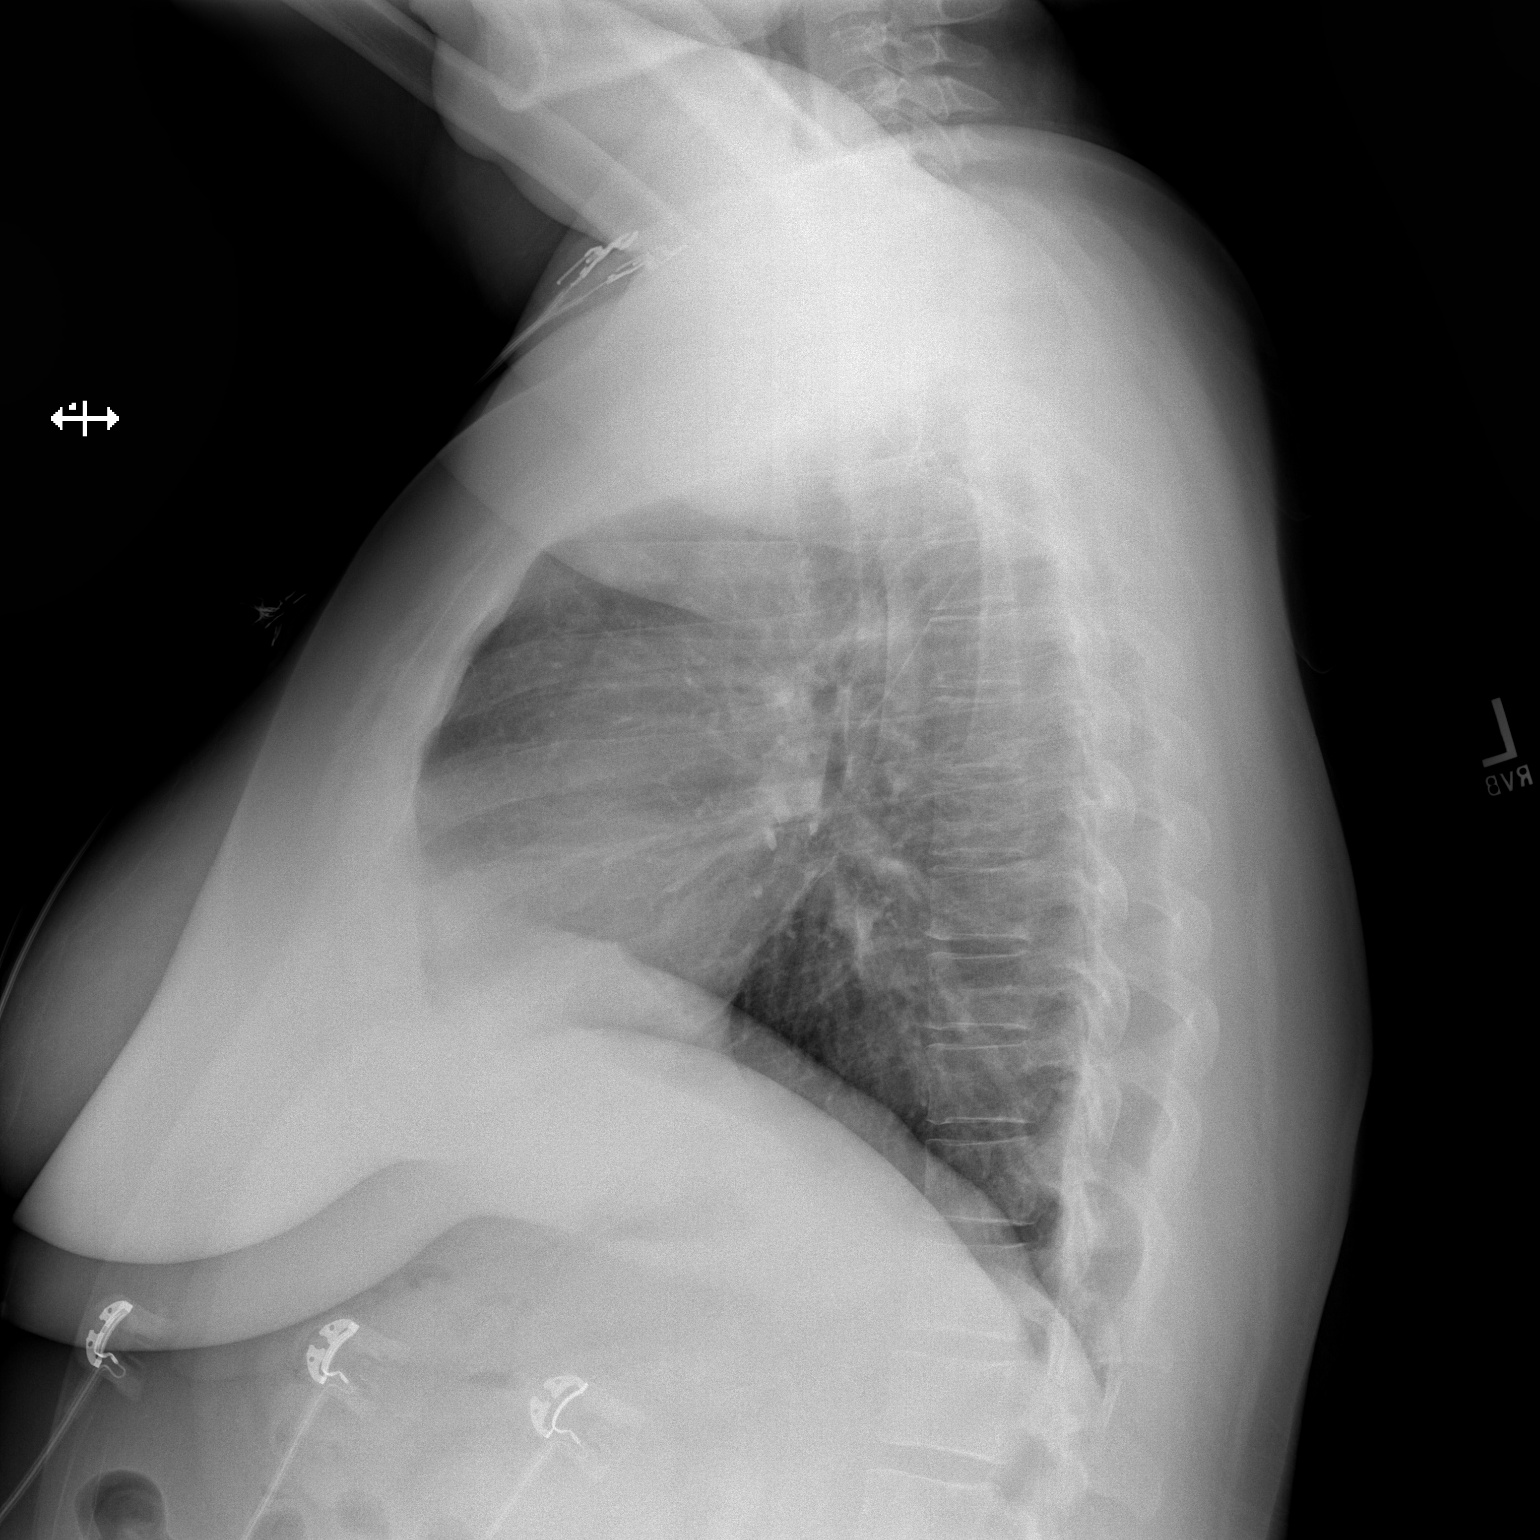

[2 of 2 positions shown; findings below may reference images not displayed]

FINDINGS: The heart size and mediastinal contours are within normal limits.
Both lungs are clear. The visualized skeletal structures are
unremarkable.
IMPRESSION: No active cardiopulmonary disease.

## 2021-01-31 ENCOUNTER — Other Ambulatory Visit: Payer: Self-pay | Admitting: Physician Assistant

## 2021-01-31 DIAGNOSIS — R1310 Dysphagia, unspecified: Secondary | ICD-10-CM

## 2021-01-31 DIAGNOSIS — Z8719 Personal history of other diseases of the digestive system: Secondary | ICD-10-CM

## 2021-02-02 ENCOUNTER — Other Ambulatory Visit: Payer: BLUE CROSS/BLUE SHIELD

## 2021-03-01 ENCOUNTER — Other Ambulatory Visit: Payer: Self-pay | Admitting: Physician Assistant

## 2021-03-01 DIAGNOSIS — R1310 Dysphagia, unspecified: Secondary | ICD-10-CM

## 2021-03-01 DIAGNOSIS — Z8719 Personal history of other diseases of the digestive system: Secondary | ICD-10-CM

## 2022-05-29 ENCOUNTER — Other Ambulatory Visit: Payer: Self-pay | Admitting: Gastroenterology

## 2022-05-29 DIAGNOSIS — R1310 Dysphagia, unspecified: Secondary | ICD-10-CM

## 2022-05-29 DIAGNOSIS — Z8719 Personal history of other diseases of the digestive system: Secondary | ICD-10-CM

## 2022-06-09 ENCOUNTER — Other Ambulatory Visit: Payer: Self-pay

## 2022-06-23 ENCOUNTER — Inpatient Hospital Stay: Admission: RE | Admit: 2022-06-23 | Payer: Self-pay | Source: Ambulatory Visit

## 2022-08-11 ENCOUNTER — Other Ambulatory Visit: Payer: Self-pay

## 2022-08-11 ENCOUNTER — Encounter (HOSPITAL_COMMUNITY): Payer: Self-pay | Admitting: Emergency Medicine

## 2022-08-11 ENCOUNTER — Emergency Department (HOSPITAL_COMMUNITY): Payer: No Typology Code available for payment source

## 2022-08-11 ENCOUNTER — Emergency Department (HOSPITAL_COMMUNITY)
Admission: EM | Admit: 2022-08-11 | Discharge: 2022-08-11 | Disposition: A | Discharge status: Home / Self Care | Payer: No Typology Code available for payment source | Attending: Emergency Medicine | Admitting: Emergency Medicine

## 2022-08-11 DIAGNOSIS — Z91040 Latex allergy status: Secondary | ICD-10-CM | POA: Diagnosis not present

## 2022-08-11 DIAGNOSIS — R202 Paresthesia of skin: Secondary | ICD-10-CM | POA: Insufficient documentation

## 2022-08-11 DIAGNOSIS — R4589 Other symptoms and signs involving emotional state: Secondary | ICD-10-CM

## 2022-08-11 DIAGNOSIS — F419 Anxiety disorder, unspecified: Secondary | ICD-10-CM | POA: Diagnosis present

## 2022-08-11 DIAGNOSIS — Z8673 Personal history of transient ischemic attack (TIA), and cerebral infarction without residual deficits: Secondary | ICD-10-CM | POA: Diagnosis not present

## 2022-08-11 LAB — BASIC METABOLIC PANEL
Anion gap: 11 (ref 5–15)
BUN: <5 mg/dL — ABNORMAL LOW (ref 6–20)
CO2: 23 mmol/L (ref 22–32)
Calcium: 9 mg/dL (ref 8.9–10.3)
Chloride: 106 mmol/L (ref 98–111)
Creatinine, Ser: 0.87 mg/dL (ref 0.44–1.00)
GFR, Estimated: >60 mL/min (ref >60)
Glucose, Bld: 103 mg/dL — ABNORMAL HIGH (ref 70–99)
Potassium: 4.3 mmol/L (ref 3.5–5.1)
Sodium: 140 mmol/L (ref 135–145)

## 2022-08-11 LAB — CBC WITH DIFFERENTIAL/PLATELET
Abs Immature Granulocytes: 0.04 10*3/uL (ref 0.00–0.07)
Basophils Absolute: 0 10*3/uL (ref 0.0–0.1)
Basophils Relative: 0 %
Eosinophils Absolute: 0.2 10*3/uL (ref 0.0–0.5)
Eosinophils Relative: 2 %
HCT: 44.2 % (ref 36.0–46.0)
Hemoglobin: 13.3 g/dL (ref 12.0–15.0)
Immature Granulocytes: 0 %
Lymphocytes Relative: 22 %
Lymphs Abs: 2.6 10*3/uL (ref 0.7–4.0)
MCH: 27.5 pg (ref 26.0–34.0)
MCHC: 30.1 g/dL (ref 30.0–36.0)
MCV: 91.5 fL (ref 80.0–100.0)
Monocytes Absolute: 0.7 10*3/uL (ref 0.1–1.0)
Monocytes Relative: 6 %
Neutro Abs: 8.2 10*3/uL — ABNORMAL HIGH (ref 1.7–7.7)
Neutrophils Relative %: 70 %
Platelets: 334 10*3/uL (ref 150–400)
RBC: 4.83 MIL/uL (ref 3.87–5.11)
RDW: 14.4 % (ref 11.5–15.5)
WBC: 11.7 10*3/uL — ABNORMAL HIGH (ref 4.0–10.5)
nRBC: 0 % (ref 0.0–0.2)

## 2022-08-11 MED ORDER — LACTATED RINGERS IV BOLUS
250.0000 mL | Freq: Once | INTRAVENOUS | Status: AC
  Administered 2022-08-11: 250 mL via INTRAVENOUS

## 2022-08-11 MED ORDER — HYDROXYZINE HCL 25 MG PO TABS: 25.0000 mg | ORAL_TABLET | Freq: Four times a day (QID) | ORAL | 0 refills | Status: AC

## 2022-08-11 MED ORDER — HYDROXYZINE HCL 25 MG PO TABS
25.0000 mg | ORAL_TABLET | Freq: Once | ORAL | Status: AC
  Administered 2022-08-11: 25 mg via ORAL
  Filled 2022-08-11: qty 1

## 2022-08-11 MED ORDER — HYDROXYZINE HCL 25 MG PO TABS: 25.0000 mg | ORAL_TABLET | Freq: Four times a day (QID) | ORAL | 0 refills | Status: DC

## 2022-08-11 NOTE — ED Provider Notes (Signed)
[place] EMERGENCY DEPARTMENT AT [facility] Provider Note   CSN: [id number] Arrival date & time: 08/11/22  1701     History  Chief Complaint  Patient presents with   Anxiety    Dominique [name] is a 47 y.o. female.  47 year old female presents today for concern of uncontrolled anxiety for the past 2 weeks.  She has not seen her PCP.  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  She is worried that she may be having a stroke or her symptoms may never go away.  She denies any chest pain, shortness of breath or other complaints.  The history is provided by the patient. No language interpreter was used.       Home Medications Prior to Admission medications   Medication Sig Start Date End Date Taking? Authorizing Provider  atorvastatin (LIPITOR) 80 MG tablet Take 80 mg by mouth daily.   Yes [provider]  gabapentin (NEURONTIN) 300 MG capsule Take 300 mg by mouth 3 (three) times daily.   Yes [provider]  omeprazole (PRILOSEC) 40 MG capsule Take 40 mg by mouth 2 (two) times daily.   Yes [provider]  amitriptyline (ELAVIL) 50 MG tablet Take 1 tablet (50 mg total) by mouth at bedtime. 01/04/17 04/04/17  Trey Sailors, PA-C  b complex vitamins capsule Take 1 capsule by mouth daily. Patient not taking: Reported on 08/11/2022 10/10/15   Johney Maine, MD  cyclobenzaprine (FLEXERIL) 5 MG tablet Take 1-2 tablets 3 times daily as needed Patient not taking: Reported on 12/15/2016 12/06/16   Enid Derry, PA-C  HYDROcodone-acetaminophen (NORCO/VICODIN) 5-325 MG tablet Take 1 tablet every 6 (six) hours as needed by mouth for moderate pain. Patient not taking: Reported on 08/11/2022 11/26/16   Bethann Berkshire, MD      Allergies    Contrast media [iodinated contrast media], Latex, Nsaids, Doxycycline, Egg-derived products, Lactose intolerance (gi), and Zofran [ondansetron hcl]    Review of Systems   Review of Systems  Constitutional:   Negative for chills and fever.  Respiratory:  Negative for shortness of breath.   Cardiovascular:  Negative for chest pain.  Neurological:  Positive for numbness.  Psychiatric/Behavioral:  The patient is nervous/anxious.   All other systems reviewed and are negative.   Physical Exam Updated Vital Signs BP 132/80 (BP Location: Right Arm)   Pulse 83   Temp 98 F (36.7 C) (Oral)   Resp 17   Ht 4\' 7"  (1.397 m)   Wt 89.8 kg   SpO2 100%   BMI 46.02 kg/m  Physical Exam Vitals and nursing note reviewed.  Constitutional:      General: She is not in acute distress.    Appearance: Normal appearance. She is not ill-appearing.  HENT:     Head: Normocephalic and atraumatic.     Nose: Nose normal.  Eyes:     Conjunctiva/sclera: Conjunctivae normal.  Cardiovascular:     Rate and Rhythm: Normal rate and regular rhythm.  Pulmonary:     Effort: Pulmonary effort is normal. No respiratory distress.     Breath sounds: Normal breath sounds. No wheezing.  Musculoskeletal:        General: No deformity.  Skin:    Findings: No rash.  Neurological:     Mental Status: She is alert.     ED Results / Procedures / Treatments   Labs (all labs ordered are listed, but only abnormal results are displayed) Labs Reviewed  CBC WITH DIFFERENTIAL/PLATELET -  Abnormal; Notable for the following components:      Result Value   WBC 11.7 (*)    Neutro Abs 8.2 (*)    All other components within normal limits  BASIC METABOLIC PANEL - Abnormal; Notable for the following components:   Glucose, Bld 103 (*)    BUN <5 (*)    All other components within normal limits    EKG None  Radiology CT Head Wo Contrast  Result Date: 08/11/2022 CLINICAL DATA:  TIA symptoms EXAM: CT HEAD WITHOUT CONTRAST TECHNIQUE: Contiguous axial images were obtained from the base of the skull through the vertex without intravenous contrast. RADIATION DOSE REDUCTION: This exam was performed according to the departmental  dose-optimization program which includes automated exposure control, adjustment of the mA and/or kV according to patient size and/or use of iterative reconstruction technique. COMPARISON:  None Available. FINDINGS: Brain: No acute infarct or hemorrhage. Lateral ventricles and midline structures are unremarkable. No acute extra-axial fluid collections. No mass effect. Vascular: No hyperdense vessel or unexpected calcification. Skull: Normal. Negative for fracture or focal lesion. Sinuses/Orbits: No acute finding. Other: None. IMPRESSION: 1. No acute intracranial process. Electronically Signed   By: Sharlet Salina M.D.   On: 08/11/2022 21:18    Procedures Procedures    Medications Ordered in ED Medications  hydrOXYzine (ATARAX) tablet 25 mg (25 mg Oral Given 08/11/22 2049)  lactated ringers bolus 250 mL (250 mLs Intravenous New Bag/Given 08/11/22 2216)    ED Course/ Medical Decision Making/ A&P Clinical Course as of 08/11/22 2224  Fri Aug 11, 2022  2212 CT Head Wo Contrast [AA]    Clinical Course User Index [AA] Marita Kansas, PA-C                                 Medical Decision Making Amount and/or Complexity of Data Reviewed Labs: ordered. Radiology: ordered. Decision-making details documented in ED Course.  Risk Prescription drug management.   47 year old female presents today for concern of anxiety.  CBC, BMP reassuring and without acute concerns.  CT head without contrast without acute intracranial finding.  Fluids given.  Patient with some improvement in symptoms.  Will prescribe Atarax.  Will give her referral to [facility] internal medicine center.  She is in agreement with this plan.  She is discharged in stable condition.  Final Clinical Impression(s) / ED Diagnoses Final diagnoses:  Feeling anxious  Paresthesias    Rx / DC Orders ED Discharge Orders          Ordered    hydrOXYzine (ATARAX) 25 MG tablet  Every 6 hours,   Status:  Discontinued        08/11/22 2229     hydrOXYzine (ATARAX) 25 MG tablet  Every 6 hours        08/11/22 2229              Marita Kansas, PA-C 08/11/22 2229    Rolan Bucco, MD 08/11/22 2337

## 2022-08-11 NOTE — Discharge Instructions (Signed)
Your workup today was reassuring.  I prescribed Atarax for you.  Follow-up with the internal medicine clinic.  For any concerning symptoms return to the emergency room.

## 2022-08-11 NOTE — ED Triage Notes (Signed)
Pt reports she is not feeling right.  Reports she is a recovering addict. Has felt like she is having high anxiety versus "allergic reaction" but isn't sure what is going on.

## 2022-08-28 ENCOUNTER — Other Ambulatory Visit (HOSPITAL_COMMUNITY): Payer: Self-pay | Admitting: Gastroenterology

## 2022-08-28 DIAGNOSIS — R1310 Dysphagia, unspecified: Secondary | ICD-10-CM

## 2022-08-28 DIAGNOSIS — Z8719 Personal history of other diseases of the digestive system: Secondary | ICD-10-CM

## 2022-09-08 ENCOUNTER — Ambulatory Visit (HOSPITAL_COMMUNITY): Admission: RE | Admit: 2022-09-08 | Payer: No Typology Code available for payment source | Source: Ambulatory Visit

## 2022-09-15 ENCOUNTER — Ambulatory Visit (HOSPITAL_COMMUNITY): Payer: No Typology Code available for payment source

## 2022-09-29 ENCOUNTER — Ambulatory Visit (HOSPITAL_COMMUNITY)
Admission: RE | Admit: 2022-09-29 | Discharge: 2022-09-29 | Disposition: A | Discharge status: Home / Self Care | Payer: No Typology Code available for payment source | Source: Ambulatory Visit | Attending: Gastroenterology | Admitting: Gastroenterology

## 2022-09-29 DIAGNOSIS — Z8719 Personal history of other diseases of the digestive system: Secondary | ICD-10-CM | POA: Diagnosis present

## 2022-09-29 DIAGNOSIS — R1310 Dysphagia, unspecified: Secondary | ICD-10-CM | POA: Insufficient documentation
